# Patient Record
Sex: Male | Born: 1970 | Race: Black or African American | Hispanic: No | Marital: Single | Smoking: Current every day smoker
Health system: Southern US, Community
[De-identification: ages and names within clinical notes are randomized; demographics above are authoritative.]

## PROBLEM LIST (undated history)

## (undated) DIAGNOSIS — Z720 Tobacco use: Secondary | ICD-10-CM

## (undated) DIAGNOSIS — J45909 Unspecified asthma, uncomplicated: Secondary | ICD-10-CM

## (undated) DIAGNOSIS — J449 Chronic obstructive pulmonary disease, unspecified: Secondary | ICD-10-CM

## (undated) DIAGNOSIS — D696 Thrombocytopenia, unspecified: Secondary | ICD-10-CM

## (undated) DIAGNOSIS — E785 Hyperlipidemia, unspecified: Secondary | ICD-10-CM

## (undated) DIAGNOSIS — F419 Anxiety disorder, unspecified: Secondary | ICD-10-CM

## (undated) HISTORY — PX: FRACTURE SURGERY: SHX138

## (undated) HISTORY — DX: Anxiety disorder, unspecified: F41.9

## (undated) HISTORY — DX: Hyperlipidemia, unspecified: E78.5

---

## 1997-08-03 ENCOUNTER — Emergency Department (HOSPITAL_COMMUNITY): Admission: EM | Admit: 1997-08-03 | Discharge: 1997-08-03 | Payer: Self-pay | Admitting: Emergency Medicine

## 1998-07-09 ENCOUNTER — Encounter: Payer: Self-pay | Admitting: Emergency Medicine

## 1998-07-09 ENCOUNTER — Emergency Department (HOSPITAL_COMMUNITY): Admission: EM | Admit: 1998-07-09 | Discharge: 1998-07-09 | Payer: Self-pay | Admitting: Emergency Medicine

## 1998-07-28 ENCOUNTER — Emergency Department (HOSPITAL_COMMUNITY): Admission: EM | Admit: 1998-07-28 | Discharge: 1998-07-28 | Payer: Self-pay | Admitting: Emergency Medicine

## 1998-07-28 ENCOUNTER — Encounter: Payer: Self-pay | Admitting: Emergency Medicine

## 2005-05-01 ENCOUNTER — Emergency Department (HOSPITAL_COMMUNITY): Admission: EM | Admit: 2005-05-01 | Discharge: 2005-05-01 | Payer: Self-pay | Admitting: Family Medicine

## 2005-07-27 ENCOUNTER — Emergency Department (HOSPITAL_COMMUNITY): Admission: EM | Admit: 2005-07-27 | Discharge: 2005-07-27 | Payer: Self-pay | Admitting: Emergency Medicine

## 2006-01-17 ENCOUNTER — Emergency Department (HOSPITAL_COMMUNITY): Admission: EM | Admit: 2006-01-17 | Discharge: 2006-01-17 | Payer: Self-pay | Admitting: Emergency Medicine

## 2006-03-06 ENCOUNTER — Emergency Department (HOSPITAL_COMMUNITY): Admission: EM | Admit: 2006-03-06 | Discharge: 2006-03-07 | Payer: Self-pay | Admitting: Emergency Medicine

## 2006-03-08 ENCOUNTER — Emergency Department (HOSPITAL_COMMUNITY): Admission: EM | Admit: 2006-03-08 | Discharge: 2006-03-08 | Payer: Self-pay | Admitting: Emergency Medicine

## 2006-06-05 ENCOUNTER — Emergency Department (HOSPITAL_COMMUNITY): Admission: EM | Admit: 2006-06-05 | Discharge: 2006-06-05 | Payer: Self-pay | Admitting: Emergency Medicine

## 2006-12-11 ENCOUNTER — Emergency Department (HOSPITAL_COMMUNITY): Admission: EM | Admit: 2006-12-11 | Discharge: 2006-12-11 | Payer: Self-pay | Admitting: Emergency Medicine

## 2007-01-04 ENCOUNTER — Emergency Department (HOSPITAL_COMMUNITY): Admission: EM | Admit: 2007-01-04 | Discharge: 2007-01-04 | Payer: Self-pay | Admitting: Emergency Medicine

## 2007-04-18 ENCOUNTER — Emergency Department (HOSPITAL_COMMUNITY): Admission: EM | Admit: 2007-04-18 | Discharge: 2007-04-18 | Payer: Self-pay | Admitting: Emergency Medicine

## 2007-11-24 ENCOUNTER — Emergency Department (HOSPITAL_COMMUNITY): Admission: EM | Admit: 2007-11-24 | Discharge: 2007-11-24 | Payer: Self-pay | Admitting: Emergency Medicine

## 2008-03-17 ENCOUNTER — Emergency Department (HOSPITAL_COMMUNITY): Admission: EM | Admit: 2008-03-17 | Discharge: 2008-03-17 | Payer: Self-pay | Admitting: Emergency Medicine

## 2012-02-03 ENCOUNTER — Emergency Department (HOSPITAL_COMMUNITY)
Admission: EM | Admit: 2012-02-03 | Discharge: 2012-02-03 | Disposition: A | Discharge status: Home / Self Care | Payer: Self-pay | Attending: Emergency Medicine | Admitting: Emergency Medicine

## 2012-02-03 ENCOUNTER — Emergency Department (HOSPITAL_COMMUNITY): Payer: Self-pay

## 2012-02-03 ENCOUNTER — Encounter (HOSPITAL_COMMUNITY): Payer: Self-pay | Admitting: *Deleted

## 2012-02-03 DIAGNOSIS — S82899A Other fracture of unspecified lower leg, initial encounter for closed fracture: Secondary | ICD-10-CM

## 2012-02-03 DIAGNOSIS — Y9239 Other specified sports and athletic area as the place of occurrence of the external cause: Secondary | ICD-10-CM | POA: Insufficient documentation

## 2012-02-03 DIAGNOSIS — R609 Edema, unspecified: Secondary | ICD-10-CM | POA: Insufficient documentation

## 2012-02-03 DIAGNOSIS — S82853A Displaced trimalleolar fracture of unspecified lower leg, initial encounter for closed fracture: Secondary | ICD-10-CM | POA: Insufficient documentation

## 2012-02-03 DIAGNOSIS — Y9351 Activity, roller skating (inline) and skateboarding: Secondary | ICD-10-CM | POA: Insufficient documentation

## 2012-02-03 MED ORDER — OXYCODONE-ACETAMINOPHEN 5-325 MG PO TABS
2.0000 | ORAL_TABLET | Freq: Once | ORAL | Status: AC
  Administered 2012-02-03: 2 via ORAL
  Filled 2012-02-03: qty 2

## 2012-02-03 NOTE — Progress Notes (Signed)
Orthopedic Tech Progress Note Patient Details:  Anthony Finley 1970/06/25 [id number]  Ortho Devices Type of Ortho Device: Post (short) splint;Stirrup splint;Crutches Splint Material: Plaster Ortho Device/Splint Location: left leg Ortho Device/Splint Interventions: Application   Katieann Hungate 02/03/2012, 10:58 PM

## 2012-02-03 NOTE — Consult Note (Signed)
CC: left ankle fracture  HPI: pt roller skating and felt a pop to right ankle and then was unable to walk Denies any other injuries other than right ankle PMH: negative Meds: none Allergies: cephalosporins ROS: unable to ambulate on left ankle, otherwise negative Family: non contributory PE: alert and appropriate 41 y/o male in no acute distress Vitals within normal limits Bilateral upper extremities show full rom, no deformity or tenderness Left lower extremity: obvious deformity to left ankle with edema nv intact distally Right lower extremity negative exam X-rays: tri malleolar ankle fracture with widened mortis left Assessment: left tri malleolar ankle fracture with widened mortis Plan: splint, crutches and pain control Non weight bearing F/u next week to discuss surgical options

## 2012-02-03 NOTE — ED Provider Notes (Signed)
History     CSN: [id number]  Arrival date & time 02/03/12  2044   First MD Initiated Contact with Patient 02/03/12 2059      Chief Complaint  Patient presents with  . Ankle Pain    (Consider location/radiation/quality/duration/timing/severity/associated sxs/prior treatment) HPI Comments: Patient states he was rollerskating with his children when he slipped and fell, twisting his left ankle.  Now has medial swelling.    Patient is a 40 y.o. male presenting with ankle pain. The history is provided by the patient.  Ankle Pain  The incident occurred 1 to 2 hours ago. [place] The injury mechanism was a fall. The pain is present in the left ankle. The quality of the pain is described as aching. The pain is at a severity of 6/10. The pain is moderate. The pain has been constant since onset. Associated symptoms include numbness, inability to bear weight and loss of motion. Pertinent negatives include no loss of sensation and no tingling. The symptoms are aggravated by activity. He has tried nothing for the symptoms.    History reviewed. No pertinent past medical history.  History reviewed. No pertinent past surgical history.  History reviewed. No pertinent family history.  History  Substance Use Topics  . Smoking status: Current Every Day Smoker  . Smokeless tobacco: Not on file  . Alcohol Use: No      Review of Systems  Constitutional: Negative for fever.  Musculoskeletal: Positive for joint swelling.  Skin: Negative for wound.  Neurological: Positive for numbness. Negative for tingling.    Allergies  Cephalosporins; Fish-derived products; and Keflex  Home Medications   Current Outpatient Rx  Name Route Sig Dispense Refill  . ALBUTEROL SULFATE HFA 108 (90 BASE) MCG/ACT IN AERS Inhalation Inhale 2 puffs into the lungs every 6 (six) hours as needed. For shortness of breath      BP 121/87  Pulse 87  Temp 98.8 F (37.1 C) (Oral)  Resp 17   SpO2 97%  Physical Exam  Constitutional: He appears well-developed and well-nourished.  HENT:  Head: Normocephalic.  Eyes: Pupils are equal, round, and reactive to light.  Neck: Normal range of motion.  Cardiovascular: Normal rate.   Pulmonary/Chest: Effort normal.  Musculoskeletal: He exhibits edema and tenderness.       Left ankle: He exhibits decreased range of motion and swelling. He exhibits no ecchymosis, no deformity, no laceration and normal pulse. tenderness. Medial malleolus tenderness found.  Neurological: He is alert.  Skin: Skin is warm.    ED Course  Procedures (including critical care time)  Labs Reviewed - No data to display No results found.   No diagnosis found.    MDM   Patient seen by Alphonsa Overall, Dr. Deeann Dowse, PA.  Splint has been placed Refill and movement of toes.  Adequate.  He was given prescription for pain medication, and instructions for followup       Arman Filter, NP 02/03/12 2305

## 2012-02-03 NOTE — ED Provider Notes (Signed)
Medical screening examination/treatment/procedure(s) were performed by non-physician practitioner and as supervising physician I was immediately available for consultation/collaboration.   Richardean Canal, MD 02/03/12 2325

## 2012-02-03 NOTE — ED Notes (Signed)
Patient was roller skating when he injured his left ankle.  He states that he heard his ankle pop.  Patient is able to wiggle toes and feel his toes but pain is worse when stands on it.

## 2012-02-18 ENCOUNTER — Encounter (HOSPITAL_BASED_OUTPATIENT_CLINIC_OR_DEPARTMENT_OTHER): Payer: Self-pay | Admitting: *Deleted

## 2012-02-18 NOTE — Progress Notes (Signed)
Denies any heart problems-hx asthma-very rare to use inhaler. Does smoke

## 2012-02-22 ENCOUNTER — Ambulatory Visit (HOSPITAL_BASED_OUTPATIENT_CLINIC_OR_DEPARTMENT_OTHER): Payer: Self-pay | Admitting: Anesthesiology

## 2012-02-22 ENCOUNTER — Encounter (HOSPITAL_BASED_OUTPATIENT_CLINIC_OR_DEPARTMENT_OTHER): Payer: Self-pay | Admitting: Anesthesiology

## 2012-02-22 ENCOUNTER — Ambulatory Visit (HOSPITAL_BASED_OUTPATIENT_CLINIC_OR_DEPARTMENT_OTHER)
Admission: RE | Admit: 2012-02-22 | Discharge: 2012-02-22 | Disposition: A | Discharge status: Home / Self Care | Payer: Self-pay | Source: Ambulatory Visit | Attending: Orthopedic Surgery | Admitting: Orthopedic Surgery

## 2012-02-22 ENCOUNTER — Encounter (HOSPITAL_BASED_OUTPATIENT_CLINIC_OR_DEPARTMENT_OTHER)
Admission: RE | Disposition: A | Discharge status: Home / Self Care | Payer: Self-pay | Source: Ambulatory Visit | Attending: Orthopedic Surgery

## 2012-02-22 ENCOUNTER — Encounter (HOSPITAL_BASED_OUTPATIENT_CLINIC_OR_DEPARTMENT_OTHER): Payer: Self-pay

## 2012-02-22 ENCOUNTER — Ambulatory Visit (HOSPITAL_COMMUNITY): Payer: Self-pay

## 2012-02-22 DIAGNOSIS — Y929 Unspecified place or not applicable: Secondary | ICD-10-CM | POA: Insufficient documentation

## 2012-02-22 DIAGNOSIS — F17200 Nicotine dependence, unspecified, uncomplicated: Secondary | ICD-10-CM | POA: Insufficient documentation

## 2012-02-22 DIAGNOSIS — S82843A Displaced bimalleolar fracture of unspecified lower leg, initial encounter for closed fracture: Secondary | ICD-10-CM | POA: Insufficient documentation

## 2012-02-22 DIAGNOSIS — J45909 Unspecified asthma, uncomplicated: Secondary | ICD-10-CM | POA: Insufficient documentation

## 2012-02-22 DIAGNOSIS — S93439A Sprain of tibiofibular ligament of unspecified ankle, initial encounter: Secondary | ICD-10-CM | POA: Insufficient documentation

## 2012-02-22 DIAGNOSIS — S82842A Displaced bimalleolar fracture of left lower leg, initial encounter for closed fracture: Secondary | ICD-10-CM

## 2012-02-22 HISTORY — DX: Unspecified asthma, uncomplicated: J45.909

## 2012-02-22 HISTORY — PX: ORIF ANKLE FRACTURE: SHX5408

## 2012-02-22 SURGERY — OPEN REDUCTION INTERNAL FIXATION (ORIF) ANKLE FRACTURE
Anesthesia: General | Site: Ankle | Laterality: Left | Wound class: Clean

## 2012-02-22 MED ORDER — BUPIVACAINE HCL (PF) 0.5 % IJ SOLN
INTRAMUSCULAR | Status: DC | PRN
  Administered 2012-02-22: 150 mg

## 2012-02-22 MED ORDER — FENTANYL CITRATE 0.05 MG/ML IJ SOLN
INTRAMUSCULAR | Status: DC | PRN
  Administered 2012-02-22 (×3): 50 ug via INTRAVENOUS

## 2012-02-22 MED ORDER — RIVAROXABAN 10 MG PO TABS: 10.0000 mg | ORAL_TABLET | Freq: Every day | ORAL | Status: DC

## 2012-02-22 MED ORDER — OXYCODONE HCL 5 MG/5ML PO SOLN: 5.0000 mg | Freq: Once | ORAL | Status: DC | PRN

## 2012-02-22 MED ORDER — PROPOFOL 10 MG/ML IV BOLUS
INTRAVENOUS | Status: DC | PRN
  Administered 2012-02-22: 200 mg via INTRAVENOUS

## 2012-02-22 MED ORDER — BACITRACIN ZINC 500 UNIT/GM EX OINT
TOPICAL_OINTMENT | CUTANEOUS | Status: DC | PRN
  Administered 2012-02-22: 1 via TOPICAL

## 2012-02-22 MED ORDER — HYDROMORPHONE HCL PF 1 MG/ML IJ SOLN
0.2500 mg | INTRAMUSCULAR | Status: DC | PRN
  Administered 2012-02-22 (×2): 0.5 mg via INTRAVENOUS

## 2012-02-22 MED ORDER — MIDAZOLAM HCL 5 MG/5ML IJ SOLN
INTRAMUSCULAR | Status: DC | PRN
  Administered 2012-02-22: 2 mg via INTRAVENOUS

## 2012-02-22 MED ORDER — LIDOCAINE HCL (CARDIAC) 20 MG/ML IV SOLN
INTRAVENOUS | Status: DC | PRN
  Administered 2012-02-22: 75 mg via INTRAVENOUS

## 2012-02-22 MED ORDER — OXYCODONE HCL 5 MG PO TABS: 5.0000 mg | ORAL_TABLET | ORAL | Status: DC | PRN

## 2012-02-22 MED ORDER — MIDAZOLAM HCL 2 MG/2ML IJ SOLN
1.0000 mg | INTRAMUSCULAR | Status: DC | PRN
  Administered 2012-02-22: 2 mg via INTRAVENOUS

## 2012-02-22 MED ORDER — PHENYLEPHRINE HCL 10 MG/ML IJ SOLN
INTRAMUSCULAR | Status: DC | PRN
  Administered 2012-02-22 (×2): 80 ug via INTRAVENOUS

## 2012-02-22 MED ORDER — EPHEDRINE SULFATE 50 MG/ML IJ SOLN
INTRAMUSCULAR | Status: DC | PRN
  Administered 2012-02-22: 15 mg via INTRAVENOUS

## 2012-02-22 MED ORDER — LACTATED RINGERS IV SOLN
INTRAVENOUS | Status: DC
  Administered 2012-02-22 (×2): via INTRAVENOUS

## 2012-02-22 MED ORDER — OXYCODONE HCL 5 MG PO TABS: 5.0000 mg | ORAL_TABLET | Freq: Once | ORAL | Status: DC | PRN

## 2012-02-22 MED ORDER — PROMETHAZINE HCL 25 MG/ML IJ SOLN: 6.2500 mg | INTRAMUSCULAR | Status: DC | PRN

## 2012-02-22 MED ORDER — DEXAMETHASONE SODIUM PHOSPHATE 10 MG/ML IJ SOLN
INTRAMUSCULAR | Status: DC | PRN
  Administered 2012-02-22: 10 mg via INTRAVENOUS

## 2012-02-22 MED ORDER — FENTANYL CITRATE 0.05 MG/ML IJ SOLN
50.0000 ug | INTRAMUSCULAR | Status: DC | PRN
  Administered 2012-02-22: 100 ug via INTRAVENOUS

## 2012-02-22 MED ORDER — DEXAMETHASONE SODIUM PHOSPHATE 4 MG/ML IJ SOLN
INTRAMUSCULAR | Status: DC | PRN
  Administered 2012-02-22: 10 mg

## 2012-02-22 MED ORDER — PHENYLEPHRINE HCL 10 MG/ML IJ SOLN
10.0000 mg | INTRAVENOUS | Status: DC | PRN
  Administered 2012-02-22: 40 ug/min via INTRAVENOUS

## 2012-02-22 MED ORDER — VANCOMYCIN HCL IN DEXTROSE 1-5 GM/200ML-% IV SOLN
1000.0000 mg | INTRAVENOUS | Status: AC
  Administered 2012-02-22: 1000 mg via INTRAVENOUS

## 2012-02-22 SURGICAL SUPPLY — 68 items
ANCHOR SCREW TI W/SUT 3 (Screw) ×1 IMPLANT
BAG DECANTER FOR FLEXI CONT (MISCELLANEOUS) IMPLANT
BANDAGE ESMARK 6X9 LF (GAUZE/BANDAGES/DRESSINGS) ×1 IMPLANT
BIT DRILL 2.5X2.75 QC CALB (BIT) ×1 IMPLANT
BLADE SURG 15 STRL LF DISP TIS (BLADE) ×3 IMPLANT
BLADE SURG 15 STRL SS (BLADE) ×4
BNDG CMPR 9X6 STRL LF SNTH (GAUZE/BANDAGES/DRESSINGS) ×1
BNDG COHESIVE 4X5 TAN STRL (GAUZE/BANDAGES/DRESSINGS) ×3 IMPLANT
BNDG COHESIVE 6X5 TAN STRL LF (GAUZE/BANDAGES/DRESSINGS) ×3 IMPLANT
BNDG ESMARK 6X9 LF (GAUZE/BANDAGES/DRESSINGS) ×2
CHLORAPREP W/TINT 26ML (MISCELLANEOUS) ×2 IMPLANT
CLOTH BEACON ORANGE TIMEOUT ST (SAFETY) ×2 IMPLANT
COVER TABLE BACK 60X90 (DRAPES) ×2 IMPLANT
CUFF TOURNIQUET SINGLE 34IN LL (TOURNIQUET CUFF) ×1 IMPLANT
DECANTER SPIKE VIAL GLASS SM (MISCELLANEOUS) IMPLANT
DRAPE C-ARM 42X72 X-RAY (DRAPES) ×2 IMPLANT
DRAPE EXTREMITY T 121X128X90 (DRAPE) ×2 IMPLANT
DRAPE INCISE IOBAN 66X45 STRL (DRAPES) ×2 IMPLANT
DRAPE U-SHAPE 47X51 STRL (DRAPES) ×2 IMPLANT
DRAPE U-SHAPE 76X120 STRL (DRAPES) ×2 IMPLANT
DRESSING ADAPTIC 1/2  N-ADH (PACKING) IMPLANT
DRSG EMULSION OIL 3X3 NADH (GAUZE/BANDAGES/DRESSINGS) ×4 IMPLANT
DRSG PAD ABDOMINAL 8X10 ST (GAUZE/BANDAGES/DRESSINGS) ×4 IMPLANT
ELECT REM PT RETURN 9FT ADLT (ELECTROSURGICAL) ×2
ELECTRODE REM PT RTRN 9FT ADLT (ELECTROSURGICAL) ×1 IMPLANT
GLOVE BIO SURGEON STRL SZ8 (GLOVE) ×2 IMPLANT
GLOVE BIOGEL M 7.0 STRL (GLOVE) ×1 IMPLANT
GLOVE BIOGEL PI IND STRL 8 (GLOVE) ×1 IMPLANT
GLOVE BIOGEL PI INDICATOR 8 (GLOVE) ×1
GLOVE SKINSENSE NS SZ7.0 (GLOVE) ×1
GLOVE SKINSENSE STRL SZ7.0 (GLOVE) IMPLANT
GOWN BRE IMP PREV XXLGXLNG (GOWN DISPOSABLE) ×1 IMPLANT
GOWN PREVENTION PLUS XLARGE (GOWN DISPOSABLE) ×2 IMPLANT
GOWN PREVENTION PLUS XXLARGE (GOWN DISPOSABLE) ×1 IMPLANT
NEEDLE HYPO 22GX1.5 SAFETY (NEEDLE) IMPLANT
PACK BASIN DAY SURGERY FS (CUSTOM PROCEDURE TRAY) ×2 IMPLANT
PAD CAST 4YDX4 CTTN HI CHSV (CAST SUPPLIES) ×3 IMPLANT
PADDING CAST COTTON 4X4 STRL (CAST SUPPLIES) ×4
PADDING CAST COTTON 6X4 STRL (CAST SUPPLIES) ×2 IMPLANT
PENCIL BUTTON HOLSTER BLD 10FT (ELECTRODE) ×2 IMPLANT
PLATE ACE 100DEG 7HOLE (Plate) ×1 IMPLANT
SCREW CORTICAL 3.5MM  16MM (Screw) ×4 IMPLANT
SCREW CORTICAL 3.5MM  60MM (Screw) ×1 IMPLANT
SCREW CORTICAL 3.5MM 16MM (Screw) IMPLANT
SCREW CORTICAL 3.5MM 18MM (Screw) ×1 IMPLANT
SCREW CORTICAL 3.5MM 22MM (Screw) ×3 IMPLANT
SCREW CORTICAL 3.5MM 60MM (Screw) IMPLANT
SHEET MEDIUM DRAPE 40X70 STRL (DRAPES) ×2 IMPLANT
SPLINT FAST PLASTER 5X30 (CAST SUPPLIES) ×20
SPLINT PLASTER CAST FAST 5X30 (CAST SUPPLIES) ×20 IMPLANT
SPONGE GAUZE 4X4 12PLY (GAUZE/BANDAGES/DRESSINGS) ×2 IMPLANT
SPONGE LAP 18X18 X RAY DECT (DISPOSABLE) ×2 IMPLANT
STAPLER VISISTAT 35W (STAPLE) IMPLANT
STOCKINETTE 6  STRL (DRAPES) ×1
STOCKINETTE 6 STRL (DRAPES) ×1 IMPLANT
STRIP CLOSURE SKIN 1/2X4 (GAUZE/BANDAGES/DRESSINGS) IMPLANT
SUCTION FRAZIER TIP 10 FR DISP (SUCTIONS) ×2 IMPLANT
SUT MNCRL AB 3-0 PS2 18 (SUTURE) ×3 IMPLANT
SUT PROLENE 3 0 PS 2 (SUTURE) ×2 IMPLANT
SUT VIC AB 0 SH 27 (SUTURE) ×2 IMPLANT
SUT VIC AB 2-0 SH 27 (SUTURE)
SUT VIC AB 2-0 SH 27XBRD (SUTURE) IMPLANT
SUT VICRYL 4-0 PS2 18IN ABS (SUTURE) IMPLANT
SYR BULB 3OZ (MISCELLANEOUS) ×2 IMPLANT
SYR CONTROL 10ML LL (SYRINGE) IMPLANT
TUBE CONNECTING 20X1/4 (TUBING) ×2 IMPLANT
UNDERPAD 30X30 INCONTINENT (UNDERPADS AND DIAPERS) ×2 IMPLANT
WATER STERILE IRR 1000ML POUR (IV SOLUTION) IMPLANT

## 2012-02-22 NOTE — Anesthesia Postprocedure Evaluation (Signed)
Anesthesia Post Note  Patient: Anthony Finley  Procedure(s) Performed: Procedure(s) (LRB): OPEN REDUCTION INTERNAL FIXATION (ORIF) ANKLE FRACTURE (Left)  Anesthesia type: general  Patient location: PACU  Post pain: Pain level controlled  Post assessment: Patient's Cardiovascular Status Stable  Last Vitals:  Filed Vitals:   02/22/12 1500  BP: 92/43  Pulse: 92  Temp:   Resp: 21    Post vital signs: Reviewed and stable  Level of consciousness: sedated  Complications: No apparent anesthesia complications

## 2012-02-22 NOTE — Anesthesia Procedure Notes (Addendum)
Anesthesia Regional Block:  Popliteal block  Pre-Anesthetic Checklist: ,, timeout performed, Correct Patient, Correct Site, Correct Laterality, Correct Procedure,, site marked, risks and benefits discussed, Surgical consent,  Pre-op evaluation,  At surgeon's request and post-op pain management  Laterality: Left  Prep: chloraprep       Needles:  Injection technique: Single-shot  Needle Type: Echogenic Stimulator Needle          Additional Needles:  Procedures: ultrasound guided and nerve stimulator Popliteal block  Nerve Stimulator or Paresthesia:  Response: plantar flexion, 0.45 mA,   Additional Responses:   Narrative:  Start time: 02/22/2012 11:54 AM End time: 02/22/2012 12:05 PM  Performed by: Personally  Anesthesiologist: J. Adonis Huguenin, MD  Additional Notes: A functioning IV was confirmed and monitors were applied.  Sterile prep and drape, hand hygiene and sterile gloves were used.  Negative aspiration and test dose prior to incremental administration of local anesthetic. The patient tolerated the procedure well.Ultrasound  guidance: relevant anatomy identified, needle position confirmed, local anesthetic spread visualized around nerve(s), vascular puncture avoided.  Image printed for medical record.   Popliteal block Procedure Name: LMA Insertion Date/Time: 02/22/2012 12:45 PM Performed by: Zenia Resides D Pre-anesthesia Checklist: Patient identified, Emergency Drugs available, Suction available, Patient being monitored and Timeout performed Patient Re-evaluated:Patient Re-evaluated prior to inductionOxygen Delivery Method: Circle System Utilized Preoxygenation: Pre-oxygenation with 100% oxygen Intubation Type: IV induction Ventilation: Mask ventilation without difficulty LMA: LMA inserted LMA Size: 5.0 Number of attempts: 1 Airway Equipment and Method: bite block Placement Confirmation: positive ETCO2 and breath sounds checked- equal and bilateral Tube secured  with: Tape Dental Injury: Teeth and Oropharynx as per pre-operative assessment

## 2012-02-22 NOTE — Brief Op Note (Signed)
02/22/2012  2:38 PM  PATIENT:  Anthony Finley  41 y.o. male  PRE-OPERATIVE DIAGNOSIS:  Left Ankle Bimalleolar Fracture  POST-OPERATIVE DIAGNOSIS:  Left Ankle Bimalleolar Fracture with syndesmosis and deltoid ligament disruption  Procedure(s): 1.  ORIF left ankle bimalleolar fracture 2.  ORIF left syndesmosis 3.  Repair of left deltoid ligament 4.  Fluoro 5.  Stress exam of left ankle under fluoro  SURGEON:  Toni Arthurs, MD  ASSISTANT: n/a  ANESTHESIA:   General, regional  EBL:  minimal   TOURNIQUET:   Total Tourniquet Time Documented: Thigh (Left) - 82 minutes  COMPLICATIONS:  None apparent  DISPOSITION:  Extubated, awake and stable to recovery.  DICTATION ID:  [id number]

## 2012-02-22 NOTE — Transfer of Care (Signed)
Immediate Anesthesia Transfer of Care Note  Patient: Anthony Finley  Procedure(s) Performed: Procedure(s) (LRB) with comments: OPEN REDUCTION INTERNAL FIXATION (ORIF) ANKLE FRACTURE (Left) - Open Reduction Internal Fixation Left Ankle Bimalleolar Fracture;   Open Reduction Internal Fixation of Syndesmosis, and Repair of Deltoid Ligament  Patient Location: PACU  Anesthesia Type: General and Regional  Level of Consciousness: awake, alert  and oriented  Airway & Oxygen Therapy: Patient Spontanous Breathing and Patient connected to nasal cannula oxygen  Post-op Assessment: Report given to PACU RN and Post -op Vital signs reviewed and stable  Post vital signs: Reviewed and stable  Complications: No apparent anesthesia complications

## 2012-02-22 NOTE — H&P (Signed)
Anthony Finley is an 41 y.o. male.   Chief Complaint: left ankle injury HPI: 41 y/o male with PMH of smoking c/o L ankle pain since hurting it while roller skating last weekend.  He has a h/o left ankle fracture as a teenager but healed up and had no difficulties until this most recent injury.  He smokes a pack a day.  No h/o diabetes or thyroid problems.  Ankle is moderately painful, dull and aching.  He feels better with elevation.  Past Medical History  Diagnosis Date  . No pertinent past medical history   . Asthma     mostly as child-uses inh about q 3-4 months    Past Surgical History  Procedure Date  . Fracture surgery     orif fx lt ankle age 67    History reviewed. No pertinent family history. Social History:  reports that he has been smoking.  He does not have any smokeless tobacco history on file. He reports that he drinks alcohol. He reports that he does not use illicit drugs.  Allergies:  Allergies  Allergen Reactions  . Cephalosporins     Hives/ Swelling   . Fish-Derived Products     rash  . Keflex (Cephalexin)     unknown    Medications Prior to Admission  Medication Sig Dispense Refill  . HYDROcodone-acetaminophen (NORCO/VICODIN) 5-325 MG per tablet Take 1 tablet by mouth every 6 (six) hours as needed.      Marland Kitchen albuterol (PROVENTIL HFA;VENTOLIN HFA) 108 (90 BASE) MCG/ACT inhaler Inhale 2 puffs into the lungs every 6 (six) hours as needed. For shortness of breath        No results found for this or any previous visit (from the past 48 hour(s)). No results found.  ROS  No recent f/c/n/v/wt loss.  Blood pressure 113/80, pulse 89, temperature 98.3 F (36.8 C), temperature source Oral, resp. rate 14, height 6' (1.829 m), weight 107.049 kg (236 lb), SpO2 98.00%. Physical Exam  wn wd male in nad.  A and O x 4.  MOod and affect niormal.  EOMI.  Respirations unlabored.  L ankle with healthy and intact skin and only mild swelling.  2+ dp and pt pulses.     Assessment/Plan L ankle weber B lateral malleolus fracture - to OR for ORIF of this unstable left ankle fracture.  The risks and benefits of the alternative treatment options have been discussed in detail.  The patient wishes to proceed with surgery and specifically understands risks of bleeding, infection, nerve damage, blood clots, need for additional surgery, amputation and death.  He also understands that he's at increased risk of blood clots, infection, nonunion and wound healing problems due to his smoking.   Toni Arthurs 02/22/2012, 12:22 PM

## 2012-02-22 NOTE — Anesthesia Preprocedure Evaluation (Signed)
Anesthesia Evaluation  Patient identified by MRN, date of birth, ID band Patient awake    Reviewed: Allergy & Precautions, H&P , NPO status , Patient's Chart, lab work & pertinent test results, reviewed documented beta blocker date and time   History of Anesthesia Complications Negative for: history of anesthetic complications  Airway Mallampati: II TM Distance: >3 FB Neck ROM: Full    Dental  (+) Poor Dentition and Dental Advisory Given   Pulmonary asthma , Current Smoker,    Pulmonary exam normal       Cardiovascular negative cardio ROS      Neuro/Psych negative psych ROS   GI/Hepatic negative GI ROS, Neg liver ROS,   Endo/Other    Renal/GU negative Renal ROS     Musculoskeletal   Abdominal   Peds  Hematology negative hematology ROS (+)   Anesthesia Other Findings   Reproductive/Obstetrics                           Anesthesia Physical Anesthesia Plan  ASA: II  Anesthesia Plan: General   Post-op Pain Management:    Induction: Intravenous  Airway Management Planned: LMA  Additional Equipment:   Intra-op Plan:   Post-operative Plan: Extubation in OR  Informed Consent: I have reviewed the patients History and Physical, chart, labs and discussed the procedure including the risks, benefits and alternatives for the proposed anesthesia with the patient or authorized representative who has indicated his/her understanding and acceptance.   Dental advisory given  Plan Discussed with: CRNA, Anesthesiologist and Surgeon  Anesthesia Plan Comments:         Anesthesia Quick Evaluation

## 2012-02-22 NOTE — Progress Notes (Signed)
Assisted Dr. Singer with left, ultrasound guided, popliteal/saphenous block. Side rails up, monitors on throughout procedure. See vital signs in flow sheet. Tolerated Procedure well. 

## 2012-02-23 LAB — POCT HEMOGLOBIN-HEMACUE: Hemoglobin: 16.7 g/dL (ref 13.0–17.0)

## 2012-02-23 NOTE — Op Note (Signed)
NAMEDOUGLES, Anthony NO.:  [id number]  MEDICAL RECORD NO.:  [id number]  LOCATION:                               FACILITY:  [facility]  PHYSICIAN:  Toni Arthurs, MD        DATE OF BIRTH:  [birth year]  DATE OF PROCEDURE:  02/22/2012 DATE OF DISCHARGE:  02/22/2012                              OPERATIVE REPORT   PREOPERATIVE DIAGNOSIS:  Left ankle bimalleolar fracture.  POSTOPERATIVE DIAGNOSES: 1. Left ankle bimalleolar fracture. 2. Left ankle syndesmosis disruption. 3. Left ankle deltoid ligament disruption.  PROCEDURE: 1. Open reduction and internal fixation of left ankle bimalleolar     fracture. 2. Open reduction and internal fixation of left ankle syndesmosis. 3. Repair of left deltoid ligament. 4. Intraoperative interpretation of fluoroscopic imaging. 5. Stress examination of left ankle under fluoroscopy.  SURGEON:  Toni Arthurs, MD  ANESTHESIA:  General, regional.  ESTIMATED BLOOD LOSS:  Minimal.  TOURNIQUET TIME:  82 minutes at 225 mmHg.  COMPLICATIONS:  None apparent.  DISPOSITION:  Extubated awake and stable to recovery.  INDICATIONS FOR PROCEDURE:  The patient is a 41 year old male who was roller skating with his grandchild about a week ago when he injured his left ankle.  X-rays in the Emergency Department revealed a bimalleolar fracture including the lateral malleolus and the posterior malleolus. He also had evidence of widening of the medial clear space as well as the syndesmosis.  He presents now for operative treatment of these injuries.  He understands the risks and benefits, the alternative treatment options, and elected surgical treatment.  He specifically understands risks of bleeding, infection, nerve damage, blood clots, need for additional surgery, amputation, and death.  PROCEDURE IN DETAIL:  After preoperative consent was obtained and the correct operative site was identified, the patient was brought to the operating room and  placed supine on the operating table.  General anesthesia was induced.  Preoperative antibiotics were administered. Surgical time-out was taken.  Left lower extremity was prepped and draped in standard sterile fashion with a tourniquet around the thigh. The extremity was exsanguinated and tourniquet was inflated to 225 mmHg. An incision was made over the lateral malleolus.  Sharp dissection was carried down through the skin and subcutaneous tissue.  Fracture site was identified.  It was opened, cleaned of all hematoma and irrigated. The fracture was reduced, two 3.5 mm fully-threaded lag screws were inserted from posterior to anterior and were noted to compress the fracture appropriately.  An 7-hole 1/3rd tubular plate was then contoured to fit the lateral malleolus.  It was fixed proximally with 3 bicortical 3.5 mm screws and distally with 3 unicortical 3.5 mm screws.  At this point, mortise view was obtained under fluoroscopy. Dorsiflexion and external rotation stress was applied.  The syndesmosis was noted to widen as did the medial clear space.  The decision was made to proceed with fixation of the syndesmosis.  A stab incision was then made over the medial malleolus.  A large pelvic reduction clamp was then placed at the tip of the medial malleolus and then the distal screw at the lateral malleolus.  The syndesmosis was compressed and held with the clamp.  AP and  lateral fluoroscopic images showed appropriate reduction of the syndesmosis on both views.  At this point, the third most distal screw and the plate was removed.  A 3.5 mm fully-threaded cortical screw was inserted through all 4 cortices of the distal fibula and tibia holding the syndesmosis in a reduced position.  The clamp was removed. At this point, a mortise view was again obtained and dorsiflexion and external rotation stress was applied under live fluoroscopy.  The syndesmosis was noted to be stable, but the medial  clear space was noted to widen.  This was a rotational deformity.  With the ankle reduced posteriorly, the mortise reduced appropriately with the ankle subluxated forward to the medial clear space opened.  The decision was made at that point to proceed with repair of the deltoid ligament.  Curvilinear incision was made from the previous stab incision exposing the deltoid ligament.  The ligament was noted to be avulsed off the anterior-half of the medial malleolus.  The torn ligament was all removed from the bone. The cortical bone was removed with a rongeur.  A 3.5 mm titanium corkscrew anchor was inserted and noted to have appropriate purchase. The suture limbs were then used to repair the deltoid ligament back to its origin with multiple horizontal mattress sutures.  The tear in the superficial deltoid was then repaired with simple sutures of 0-Vicryl. Both wounds were irrigated copiously.  Inverted simple sutures of 3-0 Monocryl were used to close subcutaneous tissue.  The incisions were closed at the level of the skin with running 3-0 Prolene sutures. Sterile dressings were applied followed by a well-padded short-leg splint.  The patient was then awakened from anesthesia and transported to the recovery room in stable condition.  FOLLOWUP PLAN:  The patient will be discharged home, nonweightbearing in a short-leg splint.  He will follow up with me in 2 weeks for suture removal and conversion to a cast.     Toni Arthurs, MD     JH/MEDQ  D:  02/22/2012  T:  02/23/2012  Job:  [id number]

## 2012-02-28 ENCOUNTER — Encounter (HOSPITAL_BASED_OUTPATIENT_CLINIC_OR_DEPARTMENT_OTHER): Payer: Self-pay | Admitting: Orthopedic Surgery

## 2012-05-11 ENCOUNTER — Ambulatory Visit: Payer: Self-pay | Attending: Orthopedic Surgery | Admitting: Physical Therapy

## 2013-04-17 ENCOUNTER — Emergency Department (HOSPITAL_COMMUNITY)
Admission: EM | Admit: 2013-04-17 | Discharge: 2013-04-17 | Disposition: A | Discharge status: Home / Self Care | Payer: Self-pay | Attending: Emergency Medicine | Admitting: Emergency Medicine

## 2013-04-17 ENCOUNTER — Emergency Department (HOSPITAL_COMMUNITY): Payer: Self-pay

## 2013-04-17 ENCOUNTER — Encounter (HOSPITAL_COMMUNITY): Payer: Self-pay | Admitting: Emergency Medicine

## 2013-04-17 DIAGNOSIS — J45909 Unspecified asthma, uncomplicated: Secondary | ICD-10-CM

## 2013-04-17 DIAGNOSIS — IMO0001 Reserved for inherently not codable concepts without codable children: Secondary | ICD-10-CM | POA: Insufficient documentation

## 2013-04-17 DIAGNOSIS — J40 Bronchitis, not specified as acute or chronic: Secondary | ICD-10-CM

## 2013-04-17 DIAGNOSIS — F17200 Nicotine dependence, unspecified, uncomplicated: Secondary | ICD-10-CM | POA: Insufficient documentation

## 2013-04-17 DIAGNOSIS — R112 Nausea with vomiting, unspecified: Secondary | ICD-10-CM | POA: Insufficient documentation

## 2013-04-17 DIAGNOSIS — R197 Diarrhea, unspecified: Secondary | ICD-10-CM | POA: Insufficient documentation

## 2013-04-17 DIAGNOSIS — R52 Pain, unspecified: Secondary | ICD-10-CM | POA: Insufficient documentation

## 2013-04-17 DIAGNOSIS — Z7901 Long term (current) use of anticoagulants: Secondary | ICD-10-CM | POA: Insufficient documentation

## 2013-04-17 DIAGNOSIS — Z79899 Other long term (current) drug therapy: Secondary | ICD-10-CM | POA: Insufficient documentation

## 2013-04-17 DIAGNOSIS — J45901 Unspecified asthma with (acute) exacerbation: Secondary | ICD-10-CM | POA: Insufficient documentation

## 2013-04-17 MED ORDER — IPRATROPIUM BROMIDE 0.02 % IN SOLN
0.5000 mg | Freq: Once | RESPIRATORY_TRACT | Status: AC
  Administered 2013-04-17: 0.5 mg via RESPIRATORY_TRACT
  Filled 2013-04-17: qty 2.5

## 2013-04-17 MED ORDER — PREDNISONE 20 MG PO TABS
60.0000 mg | ORAL_TABLET | Freq: Once | ORAL | Status: AC
  Administered 2013-04-17: 60 mg via ORAL
  Filled 2013-04-17: qty 3

## 2013-04-17 MED ORDER — PREDNISONE 10 MG PO TABS: 60.0000 mg | ORAL_TABLET | Freq: Every day | ORAL | Status: DC

## 2013-04-17 MED ORDER — ALBUTEROL SULFATE (5 MG/ML) 0.5% IN NEBU
5.0000 mg | INHALATION_SOLUTION | Freq: Once | RESPIRATORY_TRACT | Status: AC
  Administered 2013-04-17: 5 mg via RESPIRATORY_TRACT
  Filled 2013-04-17: qty 1

## 2013-04-17 MED ORDER — IPRATROPIUM BROMIDE 0.02 % IN SOLN
0.5000 mg | Freq: Once | RESPIRATORY_TRACT | Status: AC
  Administered 2013-04-17: 0.5 mg via RESPIRATORY_TRACT

## 2013-04-17 MED ORDER — IPRATROPIUM BROMIDE 0.02 % IN SOLN
RESPIRATORY_TRACT | Status: AC
  Administered 2013-04-17: 0.5 mg
  Filled 2013-04-17: qty 2.5

## 2013-04-17 MED ORDER — ALBUTEROL SULFATE HFA 108 (90 BASE) MCG/ACT IN AERS
2.0000 | INHALATION_SPRAY | Freq: Once | RESPIRATORY_TRACT | Status: AC
  Administered 2013-04-17: 2 via RESPIRATORY_TRACT
  Filled 2013-04-17: qty 6.7

## 2013-04-17 NOTE — ED Notes (Signed)
Pt to nursing station demanding to leave voice raised  Pt states he has been here since 3:00 and he has to leave  Pt states he has been waiting 45 minutes for paperwork and other people have left before him  Apologized for delay and gave pt his discharge papers and reviewed them with him  Pt would not go back to the room for reassessment prior to discharge

## 2013-04-17 NOTE — ED Provider Notes (Signed)
CSN: [id number]     Arrival date & time 04/17/13  1457 History  This chart was scribed for non-physician practitioner Irish Elders, NP working with Shon Baton, MD by Danella Maiers, ED Scribe. This patient was seen in room WTR9/WTR9 and the patient's care was started at 4:55 PM.   Chief Complaint  Patient presents with  . Cough  . Generalized Body Aches   The history is provided by the patient. No language interpreter was used.   HPI Comments: Anthony Finley is a 42 y.o. male with a h/o asthma who presents to the Emergency Department complaining of worsening cough with green sputum onset one week ago with associated generalized body aches, fever, and chills. He has been using his albuterol inhaler. He is not on any other medications for his asthma. He also reports diarrhea and two episodes of emesis this week. He reports feeling light-headed and dizzy. He reports decreased appetite in the last 2 days. He states other people at work are sick with walking pneumonia.    Past Medical History  Diagnosis Date  . No pertinent past medical history   . Asthma     mostly as child-uses inh about q 3-4 months   Past Surgical History  Procedure Laterality Date  . Fracture surgery      orif fx lt ankle age 44  . Orif ankle fracture  02/22/2012    Procedure: OPEN REDUCTION INTERNAL FIXATION (ORIF) ANKLE FRACTURE;  Surgeon: Toni Arthurs, MD;  Location: [facility];  Service: Orthopedics;  Laterality: Left;  Open Reduction Internal Fixation Left Ankle Bimalleolar Fracture;   Open Reduction Internal Fixation of Syndesmosis, and Repair of Deltoid Ligament   No family history on file. History  Substance Use Topics  . Smoking status: Current Every Day Smoker -- 0.50 packs/day  . Smokeless tobacco: Not on file  . Alcohol Use: Yes     Comment: occ    Review of Systems  Constitutional: Positive for fever and chills.  Respiratory: Positive for cough.   Gastrointestinal: Positive  for nausea, vomiting and diarrhea.  Musculoskeletal: Positive for myalgias.  All other systems reviewed and are negative.    Allergies  Cephalosporins; Fish-derived products; and Keflex  Home Medications   Current Outpatient Rx  Name  Route  Sig  Dispense  Refill  . albuterol (PROVENTIL HFA;VENTOLIN HFA) 108 (90 BASE) MCG/ACT inhaler   Inhalation   Inhale 2 puffs into the lungs every 6 (six) hours as needed. For shortness of breath         . oxyCODONE (ROXICODONE) 5 MG immediate release tablet   Oral   Take 1-2 tablets (5-10 mg total) by mouth every 4 (four) hours as needed for pain.   50 tablet   0   . rivaroxaban (XARELTO) 10 MG TABS tablet   Oral   Take 1 tablet (10 mg total) by mouth daily.   14 tablet   0    BP 120/82  Pulse 103  Temp(Src) 98.1 F (36.7 C) (Oral)  Resp 16  SpO2 96% Physical Exam  Nursing note and vitals reviewed. Constitutional: He is oriented to person, place, and time. He appears well-developed and well-nourished. No distress.  HENT:  Head: Normocephalic and atraumatic.  Right Ear: Tympanic membrane normal.  Left Ear: Tympanic membrane normal.  Eyes: EOM are normal.  Neck: Neck supple. No tracheal deviation present.  Cardiovascular: Normal rate.   Pulmonary/Chest: Effort normal. No respiratory distress. He has wheezes in the  right middle field, the right lower field, the left middle field and the left lower field.  Musculoskeletal: Normal range of motion.  Neurological: He is alert and oriented to person, place, and time.  Skin: Skin is warm and dry.  Psychiatric: He has a normal mood and affect. His behavior is normal.    ED Course  Procedures (including critical care time) Medications - No data to display  DIAGNOSTIC STUDIES: Oxygen Saturation is 96% on RA, normal by my interpretation.    COORDINATION OF CARE: 5:22 PM- Discussed treatment plan with pt which includes CXR and breathing treatments. Pt agrees to plan.    Labs  Review Labs Reviewed - No data to display Imaging Review Dg Chest 2 View  04/17/2013   CLINICAL DATA:  Cough, chest pain.  EXAM: CHEST  2 VIEW  COMPARISON:  None.  FINDINGS: The heart size and mediastinal contours are within normal limits. Both lungs are clear. The visualized skeletal structures are unremarkable.  IMPRESSION: No active cardiopulmonary disease.   Electronically Signed   By: Elige Ko   On: 04/17/2013 17:37    EKG Interpretation   None       MDM   1. Bronchitis   2. Asthma    Afebrile. Chest x-ray; unremarkable. Wheezing cleared after nebs x2 and oral prednisone here. Pt states he is feeling better. SPO2 stable, no hypoxia or shortness of breath. Pt home with instructions and prednisone prescription. Advised to return if symptoms worsen or if fever, shortness of breath.  I personally performed the services described in this documentation, which was scribed in my presence. The recorded information has been reviewed and is accurate.    Irish Elders, NP 04/21/13 1626

## 2013-04-17 NOTE — ED Notes (Signed)
Pt c/o productive cough, body aches x 4 days.

## 2013-04-23 NOTE — ED Provider Notes (Signed)
Medical screening examination/treatment/procedure(s) were performed by non-physician practitioner and as supervising physician I was immediately available for consultation/collaboration.  EKG Interpretation   None         Courtney F Horton, MD 04/23/13 0658 

## 2013-08-09 ENCOUNTER — Emergency Department (HOSPITAL_COMMUNITY)
Admission: EM | Admit: 2013-08-09 | Discharge: 2013-08-09 | Discharge status: Against Medical Advice | Payer: Self-pay | Attending: Emergency Medicine | Admitting: Emergency Medicine

## 2013-08-09 ENCOUNTER — Encounter (HOSPITAL_COMMUNITY): Payer: Self-pay | Admitting: Emergency Medicine

## 2013-08-09 DIAGNOSIS — Y9389 Activity, other specified: Secondary | ICD-10-CM | POA: Insufficient documentation

## 2013-08-09 DIAGNOSIS — Y929 Unspecified place or not applicable: Secondary | ICD-10-CM | POA: Insufficient documentation

## 2013-08-09 DIAGNOSIS — S61209A Unspecified open wound of unspecified finger without damage to nail, initial encounter: Secondary | ICD-10-CM | POA: Insufficient documentation

## 2013-08-09 DIAGNOSIS — F17200 Nicotine dependence, unspecified, uncomplicated: Secondary | ICD-10-CM | POA: Insufficient documentation

## 2013-08-09 DIAGNOSIS — W278XXA Contact with other nonpowered hand tool, initial encounter: Secondary | ICD-10-CM | POA: Insufficient documentation

## 2013-08-09 DIAGNOSIS — J45909 Unspecified asthma, uncomplicated: Secondary | ICD-10-CM | POA: Insufficient documentation

## 2013-08-09 NOTE — ED Notes (Signed)
Pt states tired of waiting, decided to leave after triage without being seen by PA.

## 2013-08-09 NOTE — ED Notes (Signed)
Pt presents with c/o right ring finger laceration. Pt says the injury occurred about 6:30 this evening. Pt reports he was cutting a piece of wood with a saw and cut his finger, wrapped at this time, bleeding controlled.

## 2013-09-05 ENCOUNTER — Emergency Department (HOSPITAL_COMMUNITY)
Admission: EM | Admit: 2013-09-05 | Discharge: 2013-09-05 | Disposition: A | Discharge status: Home / Self Care | Payer: Self-pay | Attending: Emergency Medicine | Admitting: Emergency Medicine

## 2013-09-05 ENCOUNTER — Encounter (HOSPITAL_COMMUNITY): Payer: Self-pay | Admitting: Emergency Medicine

## 2013-09-05 ENCOUNTER — Emergency Department (HOSPITAL_COMMUNITY): Payer: Self-pay

## 2013-09-05 DIAGNOSIS — IMO0002 Reserved for concepts with insufficient information to code with codable children: Secondary | ICD-10-CM | POA: Insufficient documentation

## 2013-09-05 DIAGNOSIS — M7712 Lateral epicondylitis, left elbow: Secondary | ICD-10-CM

## 2013-09-05 DIAGNOSIS — F17200 Nicotine dependence, unspecified, uncomplicated: Secondary | ICD-10-CM | POA: Insufficient documentation

## 2013-09-05 DIAGNOSIS — Y9389 Activity, other specified: Secondary | ICD-10-CM | POA: Insufficient documentation

## 2013-09-05 DIAGNOSIS — S46909A Unspecified injury of unspecified muscle, fascia and tendon at shoulder and upper arm level, unspecified arm, initial encounter: Secondary | ICD-10-CM | POA: Insufficient documentation

## 2013-09-05 DIAGNOSIS — Y9289 Other specified places as the place of occurrence of the external cause: Secondary | ICD-10-CM | POA: Insufficient documentation

## 2013-09-05 DIAGNOSIS — X503XXA Overexertion from repetitive movements, initial encounter: Secondary | ICD-10-CM | POA: Insufficient documentation

## 2013-09-05 DIAGNOSIS — Z79899 Other long term (current) drug therapy: Secondary | ICD-10-CM | POA: Insufficient documentation

## 2013-09-05 DIAGNOSIS — J45909 Unspecified asthma, uncomplicated: Secondary | ICD-10-CM | POA: Insufficient documentation

## 2013-09-05 DIAGNOSIS — M7710 Lateral epicondylitis, unspecified elbow: Secondary | ICD-10-CM | POA: Insufficient documentation

## 2013-09-05 DIAGNOSIS — S4980XA Other specified injuries of shoulder and upper arm, unspecified arm, initial encounter: Secondary | ICD-10-CM | POA: Insufficient documentation

## 2013-09-05 DIAGNOSIS — Y990 Civilian activity done for income or pay: Secondary | ICD-10-CM | POA: Insufficient documentation

## 2013-09-05 MED ORDER — MELOXICAM 7.5 MG PO TABS: 15.0000 mg | ORAL_TABLET | Freq: Every day | ORAL | Status: DC

## 2013-09-05 MED ORDER — TRAMADOL HCL 50 MG PO TABS: 50.0000 mg | ORAL_TABLET | Freq: Four times a day (QID) | ORAL | Status: DC | PRN

## 2013-09-05 MED ORDER — HYDROCODONE-ACETAMINOPHEN 5-325 MG PO TABS: ORAL_TABLET | ORAL | Status: DC

## 2013-09-05 NOTE — Discharge Instructions (Signed)
Take meloxicam daily as prescribed to help with inflammation and help prevent worsening pain.  Take tramadol as needed for worsening pain. It may cause drowsiness, do not drive while taking.  Follow up with orthopedist for further evaluation and treatment.    Lateral Epicondylitis (Tennis Elbow) with Rehab Lateral epicondylitis involves inflammation and pain around the outer portion of the elbow. The pain is caused by inflammation of the tendons in the forearm that bring back (extend) the wrist. Lateral epicondylittis is also called tennis elbow, because it is very common in tennis players. However, it may occur in any individual who extends the wrist repetitively. If lateral epicondylitis is left untreated, it may become a chronic problem. SYMPTOMS   Pain, tenderness, and inflammation on the outer (lateral) side of the elbow.  Pain or weakness with gripping activities.  Pain that increases with wrist twisting motions (playing tennis, using a screwdriver, opening a door or a jar).  Pain with lifting objects, including a coffee cup. CAUSES  Lateral epicondylitis is caused by inflammation of the tendons that extend the wrist. Causes of injury may include:  Repetitive stress and strain on the muscles and tendons that extend the wrist.  Sudden change in activity level or intensity.  Incorrect grip in racquet sports.  Incorrect grip size of racquet (often too large).  Incorrect hitting position or technique (usually backhand, leading with the elbow).  Using a racket that is too heavy. RISK INCREASES WITH:  Sports or occupations that require repetitive and/or strenuous forearm and wrist movements (tennis, squash, racquetball, carpentry).  Poor wrist and forearm strength and flexibility.  Failure to warm up properly before activity.  Resuming activity before healing, rehabilitation, and conditioning are complete. PREVENTION   Warm up and stretch properly before activity.  Maintain  physical fitness:  Strength, flexibility, and endurance.  Cardiovascular fitness.  Wear and use properly fitted equipment.  Learn and use proper technique and have a coach correct improper technique.  Wear a tennis elbow (counterforce) brace. PROGNOSIS  The course of this condition depends on the degree of the injury. If treated properly, acute cases (symptoms lasting less than 4 weeks) are often resolved in 2 to 6 weeks. Chronic (longer lasting cases) often resolve in 3 to 6 months, but may require physical therapy. RELATED COMPLICATIONS   Frequently recurring symptoms, resulting in a chronic problem. Properly treating the problem the first time decreases frequency of recurrence.  Chronic inflammation, scarring tendon degeneration, and partial tendon tear, requiring surgery.  Delayed healing or resolution of symptoms. TREATMENT  Treatment first involves the use of ice and medicine, to reduce pain and inflammation. Strengthening and stretching exercises may help reduce discomfort, if performed regularly. These exercises may be performed at home, if the condition is an acute injury. Chronic cases may require a referral to a physical therapist for evaluation and treatment. Your caregiver may advise a corticosteroid injection, to help reduce inflammation. Rarely, surgery is needed. MEDICATION  If pain medicine is needed, nonsteroidal anti-inflammatory medicines (aspirin and ibuprofen), or other minor pain relievers (acetaminophen), are often advised.  Do not take pain medicine for 7 days before surgery.  Prescription pain relievers may be given, if your caregiver thinks they are needed. Use only as directed and only as much as you need.  Corticosteroid injections may be recommended. These injections should be reserved only for the most severe cases, because they can only be given a certain number of times. HEAT AND COLD  Cold treatment (icing) should be applied  for 10 to 15 minutes  every 2 to 3 hours for inflammation and pain, and immediately after activity that aggravates your symptoms. Use ice packs or an ice massage.  Heat treatment may be used before performing stretching and strengthening activities prescribed by your caregiver, physical therapist, or athletic trainer. Use a heat pack or a warm water soak. SEEK MEDICAL CARE IF: Symptoms get worse or do not improve in 2 weeks, despite treatment. EXERCISES  RANGE OF MOTION (ROM) AND STRETCHING EXERCISES - Epicondylitis, Lateral (Tennis Elbow) These exercises may help you when beginning to rehabilitate your injury. Your symptoms may go away with or without further involvement from your physician, physical therapist or athletic trainer. While completing these exercises, remember:   Restoring tissue flexibility helps normal motion to return to the joints. This allows healthier, less painful movement and activity.  An effective stretch should be held for at least 30 seconds.  A stretch should never be painful. You should only feel a gentle lengthening or release in the stretched tissue. RANGE OF MOTION  Wrist Flexion, Active-Assisted  Extend your right / left elbow with your fingers pointing down.*  Gently pull the back of your hand towards you, until you feel a gentle stretch on the top of your forearm.  Hold this position for __________ seconds. Repeat __________ times. Complete this exercise __________ times per day.  *If directed by your physician, physical therapist or athletic trainer, complete this stretch with your elbow bent, rather than extended. RANGE OF MOTION  Wrist Extension, Active-Assisted  Extend your right / left elbow and turn your palm upwards.*  Gently pull your palm and fingertips back, so your wrist extends and your fingers point more toward the ground.  You should feel a gentle stretch on the inside of your forearm.  Hold this position for __________ seconds. Repeat __________ times.  Complete this exercise __________ times per day. *If directed by your physician, physical therapist or athletic trainer, complete this stretch with your elbow bent, rather than extended. STRETCH - Wrist Flexion  Place the back of your right / left hand on a tabletop, leaving your elbow slightly bent. Your fingers should point away from your body.  Gently press the back of your hand down onto the table by straightening your elbow. You should feel a stretch on the top of your forearm.  Hold this position for __________ seconds. Repeat __________ times. Complete this stretch __________ times per day.  STRETCH  Wrist Extension   Place your right / left fingertips on a tabletop, leaving your elbow slightly bent. Your fingers should point backwards.  Gently press your fingers and palm down onto the table by straightening your elbow. You should feel a stretch on the inside of your forearm.  Hold this position for __________ seconds. Repeat __________ times. Complete this stretch __________ times per day.  STRENGTHENING EXERCISES - Epicondylitis, Lateral (Tennis Elbow) These exercises may help you when beginning to rehabilitate your injury. They may resolve your symptoms with or without further involvement from your physician, physical therapist or athletic trainer. While completing these exercises, remember:   Muscles can gain both the endurance and the strength needed for everyday activities through controlled exercises.  Complete these exercises as instructed by your physician, physical therapist or athletic trainer. Increase the resistance and repetitions only as guided.  You may experience muscle soreness or fatigue, but the pain or discomfort you are trying to eliminate should never worsen during these exercises. If this pain does get  worse, stop and make sure you are following the directions exactly. If the pain is still present after adjustments, discontinue the exercise until you can  discuss the trouble with your caregiver. STRENGTH Wrist Flexors  Sit with your right / left forearm palm-up and fully supported on a table or countertop. Your elbow should be resting below the height of your shoulder. Allow your wrist to extend over the edge of the surface.  Loosely holding a __________ weight, or a piece of rubber exercise band or tubing, slowly curl your hand up toward your forearm.  Hold this position for __________ seconds. Slowly lower the wrist back to the starting position in a controlled manner. Repeat __________ times. Complete this exercise __________ times per day.  STRENGTH  Wrist Extensors  Sit with your right / left forearm palm-down and fully supported on a table or countertop. Your elbow should be resting below the height of your shoulder. Allow your wrist to extend over the edge of the surface.  Loosely holding a __________ weight, or a piece of rubber exercise band or tubing, slowly curl your hand up toward your forearm.  Hold this position for __________ seconds. Slowly lower the wrist back to the starting position in a controlled manner. Repeat __________ times. Complete this exercise __________ times per day.  STRENGTH - Ulnar Deviators  Stand with a ____________________ weight in your right / left hand, or sit while holding a rubber exercise band or tubing, with your healthy arm supported on a table or countertop.  Move your wrist, so that your pinkie travels toward your forearm and your thumb moves away from your forearm.  Hold this position for __________ seconds and then slowly lower the wrist back to the starting position. Repeat __________ times. Complete this exercise __________ times per day STRENGTH - Radial Deviators  Stand with a ____________________ weight in your right / left hand, or sit while holding a rubber exercise band or tubing, with your injured arm supported on a table or countertop.  Raise your hand upward in front of you or  pull up on the rubber tubing.  Hold this position for __________ seconds and then slowly lower the wrist back to the starting position. Repeat __________ times. Complete this exercise __________ times per day. STRENGTH  Forearm Supinators   Sit with your right / left forearm supported on a table, keeping your elbow below shoulder height. Rest your hand over the edge, palm down.  Gently grip a hammer or a soup ladle.  Without moving your elbow, slowly turn your palm and hand upward to a "thumbs-up" position.  Hold this position for __________ seconds. Slowly return to the starting position. Repeat __________ times. Complete this exercise __________ times per day.  STRENGTH  Forearm Pronators   Sit with your right / left forearm supported on a table, keeping your elbow below shoulder height. Rest your hand over the edge, palm up.  Gently grip a hammer or a soup ladle.  Without moving your elbow, slowly turn your palm and hand upward to a "thumbs-up" position.  Hold this position for __________ seconds. Slowly return to the starting position. Repeat __________ times. Complete this exercise __________ times per day.  STRENGTH - Grip  Grasp a tennis ball, a dense sponge, or a large, rolled sock in your hand.  Squeeze as hard as you can, without increasing any pain.  Hold this position for __________ seconds. Release your grip slowly. Repeat __________ times. Complete this exercise __________ times per  day.  STRENGTH - Elbow Extensors, Isometric  Stand or sit upright, on a firm surface. Place your right / left arm so that your palm faces your stomach, and it is at the height of your waist.  Place your opposite hand on the underside of your forearm. Gently push up as your right / left arm resists. Push as hard as you can with both arms, without causing any pain or movement at your right / left elbow. Hold this stationary position for __________ seconds. Gradually release the tension in  both arms. Allow your muscles to relax completely before repeating. Document Released: 04/19/2005 Document Revised: 07/12/2011 Document Reviewed: 08/01/2008 [facility] Patient Information 2014 [place], [place].

## 2013-09-05 NOTE — ED Provider Notes (Signed)
CSN: [id number]     Arrival date & time 09/05/13  0810 History   First MD Initiated Contact with Patient 09/05/13 [id number]     Chief Complaint  Patient presents with  . Elbow Pain  . Shoulder Pain     (Consider location/radiation/quality/duration/timing/severity/associated sxs/prior Treatment) Patient is a 43 y.o. male presenting with shoulder pain. The history is provided by the patient. No language interpreter was used.  Shoulder Pain Associated symptoms include arthralgias and myalgias. Pertinent negatives include no joint swelling or neck pain.   Patient is a 43 year old male complaining of left elbow pain that has gradually worsened over the last 3 weeks. Patient reports lifting heavy wood around 3 weeks ago. Denies immediate pain but states the next day left elbow began to ache and throb. Patient states pain is now constant 9/10 at worst with movement states it is difficult to the only coughing on and time. Pain radiates into left shoulder. He is tried Tylenol PM without relief.  Denies previous injury or surgery to left arm. Patient is left-hand dominant. Denies numbness or tingling to the hand or arm.  Past Medical History  Diagnosis Date  . No pertinent past medical history   . Asthma     mostly as child-uses inh about q 3-4 months   Past Surgical History  Procedure Laterality Date  . Fracture surgery      orif fx lt ankle age 41  . Orif ankle fracture  02/22/2012    Procedure: OPEN REDUCTION INTERNAL FIXATION (ORIF) ANKLE FRACTURE;  Surgeon: Wylene Simmer, MD;  Location: [place];  Service: Orthopedics;  Laterality: Left;  Open Reduction Internal Fixation Left Ankle Bimalleolar Fracture;   Open Reduction Internal Fixation of Syndesmosis, and Repair of Deltoid Ligament   No family history on file. History  Substance Use Topics  . Smoking status: Current Every Day Smoker -- 0.50 packs/day    Types: Cigarettes  . Smokeless tobacco: Not on file  . Alcohol Use: No      Comment: occ    Review of Systems  Musculoskeletal: Positive for arthralgias and myalgias. Negative for joint swelling, neck pain and neck stiffness.       Left elbow radiating into left shoulder  Skin: Negative for color change and wound.  All other systems reviewed and are negative.     Allergies  Cephalosporins; Fish-derived products; and Keflex  Home Medications   Prior to Admission medications   Medication Sig Start Date End Date Taking? Authorizing Provider  acetaminophen (TYLENOL) 325 MG tablet Take 650 mg by mouth every 6 (six) hours as needed (pain).    Historical Provider, MD  albuterol (PROVENTIL HFA;VENTOLIN HFA) 108 (90 BASE) MCG/ACT inhaler Inhale 2 puffs into the lungs every 6 (six) hours as needed. For shortness of breath    Historical Provider, MD  predniSONE (DELTASONE) 10 MG tablet Take 6 tablets (60 mg total) by mouth daily. 04/17/13   Elisha Headland, NP  Pseudoeph-Doxylamine-DM-APAP (NYQUIL D COLD/FLU PO) Take 1 capsule by mouth at bedtime as needed (flu-like symptoms).    Historical Provider, MD   BP 125/72  Pulse 90  Temp(Src) 97.7 F (36.5 C) (Oral)  Resp 20  Ht 6' (1.829 m)  Wt 245 lb (111.131 kg)  BMI 33.22 kg/m2  SpO2 100% Physical Exam  Nursing note and vitals reviewed. Constitutional: He is oriented to person, place, and time. He appears well-developed and well-nourished.  HENT:  Head: Normocephalic and atraumatic.  Eyes: EOM are normal.  Neck: Normal range of motion.  Cardiovascular: Normal rate.   Pulses:      Radial pulses are 2+ on the left side.  Cap refill <3 seconds  Pulmonary/Chest: Effort normal.  Musculoskeletal: Normal range of motion. He exhibits tenderness. He exhibits no edema.  Left elbow-no deformity. FROM, increased pain with full elbow flexion. Tenderness along lateral epicondyle and surrounding musculature. 4/5 grip strength left hand vs right.   Neurological: He is alert and oriented to person, place, and time.   Sensation in tact distal to left elbow pain.  Skin: Skin is warm and dry.  Psychiatric: He has a normal mood and affect. His behavior is normal.    ED Course  Procedures (including critical care time) Labs Review Labs Reviewed - No data to display  Imaging Review Dg Elbow Complete Left  09/05/2013   CLINICAL DATA:  43 year old male with lifting injury, pain and tingling radiating from the posterior elbow to the shoulder. Decreased range of motion. Painful range of motion. Initial encounter.  EXAM: LEFT ELBOW - COMPLETE 3+ VIEW  COMPARISON:  None.  FINDINGS: No joint effusion identified. Bone mineralization is within normal limits. Joint spaces and alignment are preserved at the elbow. There is degenerative spurring at the olecranon on. Grossly normal soft tissue contours.  IMPRESSION: No acute osseous abnormality identified at the left elbow.   Electronically Signed   By: Lars Pinks M.D.   On: 09/05/2013 09:06     EKG Interpretation None      MDM   Final diagnoses:  Lateral epicondylitis of left elbow    Will tx for lateral epicondylitis. Left arm and hand neurovascularly in tact. Not concerned for septic joint. Will place in sling for comfort. Advised to f/u with PCP and orthopedist if not improving. Return precautions provided. Pt verbalized understanding and agreement with tx plan.     Noland Fordyce, PA-C 09/05/13 1701

## 2013-09-05 NOTE — ED Notes (Signed)
Patient states was lifting heavy wood at work x 3 weeks ago and has had L shoulder and L elbow pain since.   Patient has normal range of motion, but a lot of pain.

## 2013-09-05 NOTE — ED Notes (Signed)
Patient back from radiology.

## 2013-09-07 NOTE — ED Provider Notes (Signed)
Medical screening examination/treatment/procedure(s) were performed by non-physician practitioner and as supervising physician I was immediately available for consultation/collaboration.   EKG Interpretation None        Alfonzo Feller, DO 09/07/13 2115

## 2015-03-11 ENCOUNTER — Emergency Department (HOSPITAL_COMMUNITY)
Admission: EM | Admit: 2015-03-11 | Discharge: 2015-03-11 | Disposition: A | Discharge status: Home / Self Care | Payer: Self-pay | Attending: Emergency Medicine | Admitting: Emergency Medicine

## 2015-03-11 ENCOUNTER — Encounter (HOSPITAL_COMMUNITY): Payer: Self-pay | Admitting: Emergency Medicine

## 2015-03-11 ENCOUNTER — Emergency Department (HOSPITAL_COMMUNITY): Payer: Self-pay

## 2015-03-11 DIAGNOSIS — Y998 Other external cause status: Secondary | ICD-10-CM | POA: Insufficient documentation

## 2015-03-11 DIAGNOSIS — J45909 Unspecified asthma, uncomplicated: Secondary | ICD-10-CM | POA: Insufficient documentation

## 2015-03-11 DIAGNOSIS — S93402A Sprain of unspecified ligament of left ankle, initial encounter: Secondary | ICD-10-CM | POA: Insufficient documentation

## 2015-03-11 DIAGNOSIS — Y9301 Activity, walking, marching and hiking: Secondary | ICD-10-CM | POA: Insufficient documentation

## 2015-03-11 DIAGNOSIS — Z791 Long term (current) use of non-steroidal anti-inflammatories (NSAID): Secondary | ICD-10-CM | POA: Insufficient documentation

## 2015-03-11 DIAGNOSIS — Y9289 Other specified places as the place of occurrence of the external cause: Secondary | ICD-10-CM | POA: Insufficient documentation

## 2015-03-11 DIAGNOSIS — Z720 Tobacco use: Secondary | ICD-10-CM | POA: Insufficient documentation

## 2015-03-11 DIAGNOSIS — W010XXA Fall on same level from slipping, tripping and stumbling without subsequent striking against object, initial encounter: Secondary | ICD-10-CM | POA: Insufficient documentation

## 2015-03-11 MED ORDER — IBUPROFEN 600 MG PO TABS: 600.0000 mg | ORAL_TABLET | Freq: Four times a day (QID) | ORAL | Status: DC | PRN

## 2015-03-11 MED ORDER — OXYCODONE-ACETAMINOPHEN 5-325 MG PO TABS
1.0000 | ORAL_TABLET | Freq: Once | ORAL | Status: AC
  Administered 2015-03-11: 1 via ORAL
  Filled 2015-03-11: qty 1

## 2015-03-11 MED ORDER — HYDROCODONE-ACETAMINOPHEN 5-325 MG PO TABS: 1.0000 | ORAL_TABLET | Freq: Four times a day (QID) | ORAL | Status: DC | PRN

## 2015-03-11 NOTE — Discharge Instructions (Signed)
Take ibuprofen for pain. Vicodin for severe pain. Keep your ankle elevated, ice several times a day. Follow-up with [facility] orthopedics.   Ankle Sprain An ankle sprain is an injury to the strong, fibrous tissues (ligaments) that hold the bones of your ankle joint together.  CAUSES An ankle sprain is usually caused by a fall or by twisting your ankle. Ankle sprains most commonly occur when you step on the outer edge of your foot, and your ankle turns inward. People who participate in sports are more prone to these types of injuries.  SYMPTOMS   Pain in your ankle. The pain may be present at rest or only when you are trying to stand or walk.  Swelling.  Bruising. Bruising may develop immediately or within 1 to 2 days after your injury.  Difficulty standing or walking, particularly when turning corners or changing directions. DIAGNOSIS  Your caregiver will ask you details about your injury and perform a physical exam of your ankle to determine if you have an ankle sprain. During the physical exam, your caregiver will press on and apply pressure to specific areas of your foot and ankle. Your caregiver will try to move your ankle in certain ways. An X-ray exam may be done to be sure a bone was not broken or a ligament did not separate from one of the bones in your ankle (avulsion fracture).  TREATMENT  Certain types of braces can help stabilize your ankle. Your caregiver can make a recommendation for this. Your caregiver may recommend the use of medicine for pain. If your sprain is severe, your caregiver may refer you to a surgeon who helps to restore function to parts of your skeletal system (orthopedist) or a physical therapist. [place] ice to your injury for 1-2 days or as directed by your caregiver. Applying ice helps to reduce inflammation and pain.  Put ice in a plastic bag.  Place a towel between your skin and the bag.  Leave the ice on for 15-20 minutes at a  time, every 2 hours while you are awake.  Only take over-the-counter or prescription medicines for pain, discomfort, or fever as directed by your caregiver.  Elevate your injured ankle above the level of your heart as much as possible for 2-3 days.  If your caregiver recommends crutches, use them as instructed. Gradually put weight on the affected ankle. Continue to use crutches or a cane until you can walk without feeling pain in your ankle.  If you have a plaster splint, wear the splint as directed by your caregiver. Do not rest it on anything harder than a pillow for the first 24 hours. Do not put weight on it. Do not get it wet. You may take it off to take a shower or bath.  You may have been given an elastic bandage to wear around your ankle to provide support. If the elastic bandage is too tight (you have numbness or tingling in your foot or your foot becomes cold and blue), adjust the bandage to make it comfortable.  If you have an air splint, you may blow more air into it or let air out to make it more comfortable. You may take your splint off at night and before taking a shower or bath. Wiggle your toes in the splint several times per day to decrease swelling. SEEK MEDICAL CARE IF:   You have rapidly increasing bruising or swelling.  Your toes feel extremely cold or you lose feeling  in your foot.  Your pain is not relieved with medicine. SEEK IMMEDIATE MEDICAL CARE IF:  Your toes are numb or blue.  You have severe pain that is increasing. MAKE SURE YOU:   Understand these instructions.  Will watch your condition.  Will get help right away if you are not doing well or get worse.   This information is not intended to replace advice given to you by your health care provider. Make sure you discuss any questions you have with your health care provider.   Document Released: 04/19/2005 Document Revised: 05/10/2014 Document Reviewed: 05/01/2011 Elsevier Interactive Patient  Education [employer].

## 2015-03-11 NOTE — ED Notes (Signed)
Pt /o left ankle pain x 3 days, pt may have rolled ankle 2 days ago. Pt states he broke ankle back in 2013.

## 2015-03-11 NOTE — ED Provider Notes (Signed)
CSN: [id number]     Arrival date & time 03/11/15  1251 History   First MD Initiated Contact with Patient 03/11/15 1300     Chief Complaint  Patient presents with  . Ankle Pain    left     (Consider location/radiation/quality/duration/timing/severity/associated sxs/prior Treatment) HPI Anthony Finley is a 44 y.o. male presents to emergency department complaining of left ankle pain. Patient states he was walking his dogs 2 days ago and fell twisting his left ankle. He reports pain since then it has been getting worse every day. He states he is having difficulty walking on his ankle. He reports fracture of the same ankle 3 years ago and had to have open reduction and internal fixation of the fracture. He has taken ibuprofen for his pain prior to coming in but states it did not help. He denies any knee pain. He denies any foot pain. No numbness or weakness distal to the ankle.  Past Medical History  Diagnosis Date  . No pertinent past medical history   . Asthma     mostly as child-uses inh about q 3-4 months   Past Surgical History  Procedure Laterality Date  . Fracture surgery      orif fx lt ankle age 25  . Orif ankle fracture  02/22/2012    Procedure: OPEN REDUCTION INTERNAL FIXATION (ORIF) ANKLE FRACTURE;  Surgeon: Wylene Simmer, MD;  Location: [place];  Service: Orthopedics;  Laterality: Left;  Open Reduction Internal Fixation Left Ankle Bimalleolar Fracture;   Open Reduction Internal Fixation of Syndesmosis, and Repair of Deltoid Ligament   History reviewed. No pertinent family history. Social History  Substance Use Topics  . Smoking status: Current Every Day Smoker -- 0.50 packs/day    Types: Cigarettes  . Smokeless tobacco: None  . Alcohol Use: No     Comment: occ    Review of Systems  Constitutional: Negative for fever and chills.  Respiratory: Negative for cough, chest tightness and shortness of breath.   Cardiovascular: Negative for chest pain,  palpitations and leg swelling.  Musculoskeletal: Positive for joint swelling and arthralgias. Negative for myalgias, neck pain and neck stiffness.  Skin: Negative for rash.  Allergic/Immunologic: Negative for immunocompromised state.  Neurological: Negative for weakness and numbness.  All other systems reviewed and are negative.     Allergies  Cephalosporins; Fish-derived products; and Keflex  Home Medications   Prior to Admission medications   Medication Sig Start Date End Date Taking? Authorizing Provider  diphenhydrAMINE (BENADRYL) 25 MG tablet Take 25 mg by mouth at bedtime as needed for sleep.    Historical Provider, MD  Diphenhydramine-APAP, sleep, (TYLENOL PM EXTRA STRENGTH PO) Take 2 tablets by mouth at bedtime as needed (for pain).    Historical Provider, MD  HYDROcodone-acetaminophen (NORCO/VICODIN) 5-325 MG per tablet Take 1-2 pills every 4-6 hours as needed for pain. 09/05/13   Noland Fordyce, PA-C  meloxicam (MOBIC) 7.5 MG tablet Take 2 tablets (15 mg total) by mouth daily. 09/05/13   Noland Fordyce, PA-C   BP 141/83 mmHg  Pulse 87  Temp(Src) 97.5 F (36.4 C) (Oral)  Resp 18  SpO2 98% Physical Exam  Constitutional: He is oriented to person, place, and time. He appears well-developed and well-nourished. No distress.  HENT:  Head: Normocephalic and atraumatic.  Eyes: Conjunctivae are normal.  Neck: Neck supple.  Cardiovascular: Normal rate, regular rhythm and normal heart sounds.   Pulmonary/Chest: Effort normal. No respiratory distress. He has no wheezes. He  has no rales.  Musculoskeletal: He exhibits no edema.  Swelling noted to the lateral malleolus of the left ankle. No swelling noted to the medial malleolus foot. Tenderness over bilateral medial and lateral malleoli. No tenderness over Achilles tendon. Achilles tendon is intact. Pain with any range of motion of the ankle joint. Foot is nontender. Dorsal pedal pulses intact. Toes are warm, cap refill less than 2 seconds.   Neurological: He is alert and oriented to person, place, and time.  Skin: Skin is warm and dry.  Nursing note and vitals reviewed.   ED Course  Procedures (including critical care time) Labs Review Labs Reviewed - No data to display  Imaging Review Dg Ankle Complete Left  03/11/2015  CLINICAL DATA:  Patient tripped and fell 2 days ago while walking his dog and complains of lateral ankle pain. Initial encounter. Prior ORIF of bimalleolar left ankle fracture in October, 2013. EXAM: LEFT ANKLE COMPLETE - 3+ VIEW COMPARISON:  02/22/2012, 10/3/1,013. FINDINGS: No evidence of acute fracture or dislocation. Prior ORIF of B distal fibular fracture with routine healing. Ossification in the interosseous membrane distally. 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 Healed posterior malleolar fracture. Ankle mortise intact with mild joint space narrowing. Small joint effusion. IMPRESSION: 1. No acute osseous abnormality. 2. Prior ORIF of a distal fibular fracture with expected healing. 3. 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 Electronically Signed   By: Evangeline Dakin M.D.   On: 03/11/2015 14:09   I have personally reviewed and evaluated these images and lab results as part of my medical decision-making.   EKG Interpretation None      MDM   Final diagnoses:  Ankle sprain, left, initial encounter    Patient with left ankle injury, fracture in 2013 to the same ankle. Will get x-rays.   X-ray is negative for an acute fracture. ASO and crutches provided. Home with Norco and ibuprofen. Follow-up with orthopedic specialist for recheck.  Filed Vitals:   03/11/15 1259 03/11/15 1427  BP: 141/83   Pulse: 87 82  Temp: 97.5 F (36.4 C)   TempSrc: Oral   Resp: 18   SpO2: 98% 100%       [name], PA-C 03/11/15 1848  Blanchie Dessert, MD 03/12/15 2002

## 2015-05-14 ENCOUNTER — Emergency Department (HOSPITAL_COMMUNITY)
Admission: EM | Admit: 2015-05-14 | Discharge: 2015-05-14 | Disposition: A | Discharge status: Home / Self Care | Payer: Self-pay | Attending: Emergency Medicine | Admitting: Emergency Medicine

## 2015-05-14 DIAGNOSIS — F17210 Nicotine dependence, cigarettes, uncomplicated: Secondary | ICD-10-CM | POA: Insufficient documentation

## 2015-05-14 DIAGNOSIS — J45909 Unspecified asthma, uncomplicated: Secondary | ICD-10-CM | POA: Insufficient documentation

## 2015-05-14 DIAGNOSIS — M722 Plantar fascial fibromatosis: Secondary | ICD-10-CM | POA: Insufficient documentation

## 2015-05-14 DIAGNOSIS — Z791 Long term (current) use of non-steroidal anti-inflammatories (NSAID): Secondary | ICD-10-CM | POA: Insufficient documentation

## 2015-05-14 DIAGNOSIS — L723 Sebaceous cyst: Secondary | ICD-10-CM | POA: Insufficient documentation

## 2015-05-14 MED ORDER — MELOXICAM 15 MG PO TABS: 15.0000 mg | ORAL_TABLET | Freq: Every day | ORAL | Status: DC

## 2015-05-14 MED ORDER — TRAMADOL HCL 50 MG PO TABS: 50.0000 mg | ORAL_TABLET | Freq: Four times a day (QID) | ORAL | Status: DC | PRN

## 2015-05-14 NOTE — ED Notes (Addendum)
Pt c/o of lump under right arm x 1 month, denies drainage or pain. Pt also c/o right foot pain. Pt states pain is under bottom of foot. Denies pain right now but states it is a constant pain when standing.

## 2015-05-14 NOTE — Discharge Instructions (Signed)
Warm compresses to the cyst. Wear good arch supports. Try ice massage as discussed. Follow up with primary care doctor or podiatrist.   Plantar Fasciitis Plantar fasciitis is a painful foot condition that affects the heel. It occurs when the band of tissue that connects the toes to the heel bone (plantar fascia) becomes irritated. This can happen after exercising too much or doing other repetitive activities (overuse injury). The pain from plantar fasciitis can range from mild irritation to severe pain that makes it difficult for you to walk or move. The pain is usually worse in the morning or after you have been sitting or lying down for a while. CAUSES This condition may be caused by:  Standing for long periods of time.  Wearing shoes that do not fit.  Doing high-impact activities, including running, aerobics, and ballet.  Being overweight.  Having an abnormal way of walking (gait).  Having tight calf muscles.  Having high arches in your feet.  Starting a new athletic activity. SYMPTOMS The main symptom of this condition is heel pain. Other symptoms include:  Pain that gets worse after activity or exercise.  Pain that is worse in the morning or after resting.  Pain that goes away after you walk for a few minutes. DIAGNOSIS This condition may be diagnosed based on your signs and symptoms. Your health care provider will also do a physical exam to check for:  A tender area on the bottom of your foot.  A high arch in your foot.  Pain when you move your foot.  Difficulty moving your foot. You may also need to have imaging studies to confirm the diagnosis. These can include:  X-rays.  Ultrasound.  MRI. TREATMENT  Treatment for plantar fasciitis depends on the severity of the condition. Your treatment may include:  Rest, ice, and over-the-counter pain medicines to manage your pain.  Exercises to stretch your calves and your plantar fascia.  A splint that holds your  foot in a stretched, upward position while you sleep (night splint).  Physical therapy to relieve symptoms and prevent problems in the future.  Cortisone injections to relieve severe pain.  Extracorporeal shock wave therapy (ESWT) to stimulate damaged plantar fascia with electrical impulses. It is often used as a last resort before surgery.  Surgery, if other treatments have not worked after 12 months. HOME CARE INSTRUCTIONS  Take medicines only as directed by your health care provider.  Avoid activities that cause pain.  Roll the bottom of your foot over a bag of ice or a bottle of cold water. Do this for 20 minutes, 3-4 times a day.  Perform simple stretches as directed by your health care provider.  Try wearing athletic shoes with air-sole or gel-sole cushions or soft shoe inserts.  Wear a night splint while sleeping, if directed by your health care provider.  Keep all follow-up appointments with your health care provider. PREVENTION   Do not perform exercises or activities that cause heel pain.  Consider finding low-impact activities if you continue to have problems.  Lose weight if you need to. The best way to prevent plantar fasciitis is to avoid the activities that aggravate your plantar fascia. SEEK MEDICAL CARE IF:  Your symptoms do not go away after treatment with home care measures.  Your pain gets worse.  Your pain affects your ability to move or do your daily activities.   This information is not intended to replace advice given to you by your health care provider. Make  sure you discuss any questions you have with your health care provider.   Document Released: 01/12/2001 Document Revised: 01/08/2015 Document Reviewed: 02/27/2014 Elsevier Interactive Patient Education [employer].

## 2015-05-14 NOTE — ED Provider Notes (Signed)
CSN: [id number]     Arrival date & time 05/14/15  1101 History  By signing my name below, I, Irene Pap, attest that this documentation has been prepared under the direction and in the presence of Barbi Kumagai, PA-C. Electronically Signed: Irene Pap, ED Scribe. 05/14/2015. 12:29 PM.  Chief Complaint  Patient presents with  . Foot Pain    right  . bump under arm     right   The history is provided by the patient. No language interpreter was used.  HPI Comments: Anthony Finley is a 45 y.o. male who presents to the Emergency Department complaining of a hard, non-painful knot to the right axilla onset one month ago. Pt denies drainage from the area. The area is not red. It is not getting any bigger. The treatment for this prior to coming in. Pt also complains of sharp, intermittent right plantar foot pain onset 2 months ago. Pain is in the arch of the foot. Pt states that the foot is not painful until he stands on the foot. He has occasionally taken Aleve to no relief. He denies joint swelling, color change, wound, numbness or weakness. No injuries  Past Medical History  Diagnosis Date  . No pertinent past medical history   . Asthma     mostly as child-uses inh about q 3-4 months   Past Surgical History  Procedure Laterality Date  . Fracture surgery      orif fx lt ankle age 23  . Orif ankle fracture  02/22/2012    Procedure: OPEN REDUCTION INTERNAL FIXATION (ORIF) ANKLE FRACTURE;  Surgeon: Wylene Simmer, MD;  Location: [place];  Service: Orthopedics;  Laterality: Left;  Open Reduction Internal Fixation Left Ankle Bimalleolar Fracture;   Open Reduction Internal Fixation of Syndesmosis, and Repair of Deltoid Ligament   No family history on file. Social History  Substance Use Topics  . Smoking status: Current Every Day Smoker -- 0.50 packs/day    Types: Cigarettes  . Smokeless tobacco: Not on file  . Alcohol Use: No     Comment: occ    Review of  Systems  Constitutional: Negative for fever and chills.  Musculoskeletal: Positive for arthralgias. Negative for joint swelling.  Skin: Positive for wound. Negative for color change.  Neurological: Negative for weakness and numbness.   Allergies  Cephalosporins; Fish-derived products; and Keflex  Home Medications   Prior to Admission medications   Medication Sig Start Date End Date Taking? Authorizing Provider  diphenhydrAMINE (BENADRYL) 25 MG tablet Take 25 mg by mouth at bedtime as needed for sleep.    Historical Provider, MD  Diphenhydramine-APAP, sleep, (TYLENOL PM EXTRA STRENGTH PO) Take 2 tablets by mouth at bedtime as needed (for pain).    Historical Provider, MD  HYDROcodone-acetaminophen (NORCO) 5-325 MG tablet Take 1 tablet by mouth every 6 (six) hours as needed for moderate pain. 03/11/15   Raiden Yearwood, PA-C  ibuprofen (ADVIL,MOTRIN) 600 MG tablet Take 1 tablet (600 mg total) by mouth every 6 (six) hours as needed. 03/11/15   Preslyn Warr, PA-C  meloxicam (MOBIC) 7.5 MG tablet Take 2 tablets (15 mg total) by mouth daily. 09/05/13   Noland Fordyce, PA-C   BP 110/99 mmHg  Pulse 79  Temp(Src) 97.8 F (36.6 C) (Oral)  Resp 16  SpO2 97% Physical Exam  Constitutional: He is oriented to person, place, and time. He appears well-developed and well-nourished.  HENT:  Head: Normocephalic and atraumatic.  Eyes: EOM are normal.  Neck:  Normal range of motion. Neck supple.  Cardiovascular: Normal rate.   Pulmonary/Chest: Effort normal.  Musculoskeletal: Normal range of motion.  Normal-appearing right foot with no swelling or deformity. Tender to palpation over the plantar fascia of the foot. The larger motion of the ankle with no pain. Full range of motion of all toes.  Neurological: He is alert and oriented to person, place, and time.  Skin: Skin is warm and dry.  Small 1 x 1 cm cystic structure to the right axilla. Nontender. Not erythematous. Superficial, right under the  skin.  Psychiatric: He has a normal mood and affect. His behavior is normal.  Nursing note and vitals reviewed.   ED Course  Procedures (including critical care time) DIAGNOSTIC STUDIES: Oxygen Saturation is 97% on RA, normal by my interpretation.    COORDINATION OF CARE: 12:29 PM-Discussed treatment plan which includes ice, massage, anti-inflammatories with pt at bedside and pt agreed to plan.    Labs Review Labs Reviewed - No data to display  Imaging Review No results found. I have personally reviewed and evaluated these images and lab results as part of my medical decision-making.   EKG Interpretation None      MDM   Final diagnoses:  Sebaceous cyst of right axilla  Plantar fasciitis of right foot     patient emergency department complaining of a swelling to the right axilla and pain to the right foot. All symptoms for approximately a month or more. Right axillary cyst noted on exam. It is not swollen, erythematous, nondraining. It is not tender. Most likely sebaceous cyst. It is superficial, and I do not think this is a lymph node. Patient's foot exam is consistent with plantar fasciitis. Advised to get good arch supports, ice massage, NSAIDs, follow up with primary care doctor podiatry.  Filed Vitals:   05/14/15 1121  BP: 110/99  Pulse: 79  Temp: 97.8 F (36.6 C)  TempSrc: Oral  Resp: 16  SpO2: 97%   I personally performed the services described in this documentation, which was scribed in my presence. The recorded information has been reviewed and is accurate.   [name], PA-C 05/14/15 [place], MD 05/14/15 [phone]

## 2015-07-25 ENCOUNTER — Emergency Department (INDEPENDENT_AMBULATORY_CARE_PROVIDER_SITE_OTHER)
Admission: EM | Admit: 2015-07-25 | Discharge: 2015-07-25 | Disposition: A | Discharge status: Home / Self Care | Payer: Self-pay | Source: Home / Self Care | Attending: Family Medicine | Admitting: Family Medicine

## 2015-07-25 ENCOUNTER — Encounter (HOSPITAL_COMMUNITY): Payer: Self-pay | Admitting: Emergency Medicine

## 2015-07-25 DIAGNOSIS — M722 Plantar fascial fibromatosis: Secondary | ICD-10-CM

## 2015-07-25 MED ORDER — KETOROLAC TROMETHAMINE 60 MG/2ML IM SOLN
INTRAMUSCULAR | Status: AC
  Filled 2015-07-25: qty 2

## 2015-07-25 MED ORDER — NAPROXEN 500 MG PO TABS
500.0000 mg | ORAL_TABLET | Freq: Two times a day (BID) | ORAL | Status: DC
Start: 2015-07-25 — End: 2015-08-07

## 2015-07-25 MED ORDER — KETOROLAC TROMETHAMINE 60 MG/2ML IM SOLN
60.0000 mg | Freq: Once | INTRAMUSCULAR | Status: AC
  Administered 2015-07-25: 60 mg via INTRAMUSCULAR

## 2015-07-25 NOTE — Discharge Instructions (Signed)
It is a pleasure to see you today.   For the pain in your right heel/foot, I am prescribing NAPROXEN 500mg  take 1 tablet by mouth twice daily with food.  Do not take ibuprofen or the Aleve you have been taking.   Call Sports Medicine center for an appointment.   Note for work.   Ketorolac 60mg  intramuscular injection was given today in the urgent care center.

## 2015-07-25 NOTE — ED Notes (Signed)
The patient presented to the [facility] with a complaint of chronic left ankle pain secondary to a previous surgery and a complaint of a sharp pain in his right foot that has been occurring for greater than 1 month.

## 2015-07-25 NOTE — ED Provider Notes (Signed)
CSN: [id number]     Arrival date & time 07/25/15  1734 History   First MD Initiated Contact with Patient 07/25/15 1912     Chief Complaint  Patient presents with  . Foot Pain   (Consider location/radiation/quality/duration/timing/severity/associated sxs/prior Treatment) Patient is a 45 y.o. male presenting with lower extremity pain. The history is provided by the patient. No language interpreter was used.  Foot Pain   Patient presents for complaint of R heel pain, has been persistent for at least 2 months. Only when bears weight on the foot. Wears boots for work.  Has become more difficult to walk at work.  No traumatic injury of the R foot.  Has history of pain in the L foot, sustained fracture in L foot and had surgery 2013. Now is having some recurrence of pain in the L foot as a result of favoring the R due to the pain that is his chief complaint today.   No fevers or chills, no falls, no weakness.  Past Medical History  Diagnosis Date  . No pertinent past medical history   . Asthma     mostly as child-uses inh about q 3-4 months   Past Surgical History  Procedure Laterality Date  . Fracture surgery      orif fx lt ankle age 44  . Orif ankle fracture  02/22/2012    Procedure: OPEN REDUCTION INTERNAL FIXATION (ORIF) ANKLE FRACTURE;  Surgeon: Wylene Simmer, MD;  Location: [place];  Service: Orthopedics;  Laterality: Left;  Open Reduction Internal Fixation Left Ankle Bimalleolar Fracture;   Open Reduction Internal Fixation of Syndesmosis, and Repair of Deltoid Ligament   History reviewed. No pertinent family history. Social History  Substance Use Topics  . Smoking status: Current Every Day Smoker -- 0.50 packs/day    Types: Cigarettes  . Smokeless tobacco: None  . Alcohol Use: No     Comment: occ    Review of Systems  Allergies  Cephalosporins; Fish-derived products; and Keflex  Home Medications   Prior to Admission medications   Medication Sig Start  Date End Date Taking? Authorizing Provider  diphenhydrAMINE (BENADRYL) 25 MG tablet Take 25 mg by mouth at bedtime as needed for sleep.    Historical Provider, MD  Diphenhydramine-APAP, sleep, (TYLENOL PM EXTRA STRENGTH PO) Take 2 tablets by mouth at bedtime as needed (for pain).    Historical Provider, MD  HYDROcodone-acetaminophen (NORCO) 5-325 MG tablet Take 1 tablet by mouth every 6 (six) hours as needed for moderate pain. 03/11/15   Tatyana Kirichenko, PA-C  ibuprofen (ADVIL,MOTRIN) 600 MG tablet Take 1 tablet (600 mg total) by mouth every 6 (six) hours as needed. 03/11/15   Tatyana Kirichenko, PA-C  meloxicam (MOBIC) 15 MG tablet Take 1 tablet (15 mg total) by mouth daily. 05/14/15   Tatyana Kirichenko, PA-C  naproxen (NAPROSYN) 500 MG tablet Take 1 tablet (500 mg total) by mouth 2 (two) times daily with a meal. 07/25/15   [name], MD  traMADol (ULTRAM) 50 MG tablet Take 1 tablet (50 mg total) by mouth every 6 (six) hours as needed. 05/14/15   Tatyana Kirichenko, PA-C   Meds Ordered and Administered this Visit  Medications - No data to display  BP 143/80 mmHg  Pulse 86  Temp(Src) 97.7 F (36.5 C) (Oral)  Resp 20  SpO2 96% No data found.   Physical Exam  Constitutional: He appears well-developed and well-nourished. No distress.  Musculoskeletal:  Palpable dp pulses bilaterally.   Sensation  in toes of both feet grossly intact and symmetric.   Brisk cap refill < 2 seconds in toes of both feet.   RIGHT foot with tenderness over anterior calcaneus.   Full active ROM bilat ankles.  No tenderness over achilles tendon.  No tenderness to squeeze malleoli of R foot.   LEFT foot with scar along medial mid-foot. Full active ROM dorsi/plantar flexion of ankle.   Skin: He is not diaphoretic.    ED Course  Procedures (including critical care time)  Labs Review Labs Reviewed - No data to display  Imaging Review No results found.   Visual Acuity Review  Right Eye Distance:    Left Eye Distance:   Bilateral Distance:    Right Eye Near:   Left Eye Near:    Bilateral Near:         MDM   1. Plantar fasciitis of right foot    RIGHT plantar fasciitis.  Discussed heel cups; footwear; use of NSAIDs for pain.  Rest from work x 2 days.  He is instructed to see [facility] for evaluation.  He is receiving a Toradol 60mg  IM injection today.   Dalbert Mayotte MD    [name], MD 07/25/15 [phone]

## 2015-08-02 HISTORY — PX: HAND SURGERY: SHX662

## 2015-08-07 ENCOUNTER — Encounter (HOSPITAL_COMMUNITY): Payer: Self-pay | Admitting: Emergency Medicine

## 2015-08-07 ENCOUNTER — Emergency Department (HOSPITAL_COMMUNITY): Payer: Self-pay

## 2015-08-07 ENCOUNTER — Emergency Department (HOSPITAL_COMMUNITY)
Admission: EM | Admit: 2015-08-07 | Discharge: 2015-08-07 | Disposition: A | Discharge status: Home / Self Care | Payer: Self-pay | Attending: Emergency Medicine | Admitting: Emergency Medicine

## 2015-08-07 ENCOUNTER — Telehealth (HOSPITAL_COMMUNITY): Payer: Self-pay

## 2015-08-07 DIAGNOSIS — J45909 Unspecified asthma, uncomplicated: Secondary | ICD-10-CM | POA: Insufficient documentation

## 2015-08-07 DIAGNOSIS — S0081XA Abrasion of other part of head, initial encounter: Secondary | ICD-10-CM | POA: Insufficient documentation

## 2015-08-07 DIAGNOSIS — Z23 Encounter for immunization: Secondary | ICD-10-CM | POA: Insufficient documentation

## 2015-08-07 DIAGNOSIS — Y998 Other external cause status: Secondary | ICD-10-CM | POA: Insufficient documentation

## 2015-08-07 DIAGNOSIS — W503XXA Accidental bite by another person, initial encounter: Secondary | ICD-10-CM

## 2015-08-07 DIAGNOSIS — F17210 Nicotine dependence, cigarettes, uncomplicated: Secondary | ICD-10-CM | POA: Insufficient documentation

## 2015-08-07 DIAGNOSIS — Z791 Long term (current) use of non-steroidal anti-inflammatories (NSAID): Secondary | ICD-10-CM | POA: Insufficient documentation

## 2015-08-07 DIAGNOSIS — Y9389 Activity, other specified: Secondary | ICD-10-CM | POA: Insufficient documentation

## 2015-08-07 DIAGNOSIS — S61451A Open bite of right hand, initial encounter: Secondary | ICD-10-CM | POA: Insufficient documentation

## 2015-08-07 DIAGNOSIS — S62325A Displaced fracture of shaft of fourth metacarpal bone, left hand, initial encounter for closed fracture: Secondary | ICD-10-CM | POA: Insufficient documentation

## 2015-08-07 DIAGNOSIS — Y9289 Other specified places as the place of occurrence of the external cause: Secondary | ICD-10-CM | POA: Insufficient documentation

## 2015-08-07 DIAGNOSIS — S62308A Unspecified fracture of other metacarpal bone, initial encounter for closed fracture: Secondary | ICD-10-CM

## 2015-08-07 MED ORDER — TETANUS-DIPHTH-ACELL PERTUSSIS 5-2.5-18.5 LF-MCG/0.5 IM SUSP
0.5000 mL | Freq: Once | INTRAMUSCULAR | Status: AC
  Administered 2015-08-07: 0.5 mL via INTRAMUSCULAR
  Filled 2015-08-07: qty 0.5

## 2015-08-07 MED ORDER — METRONIDAZOLE 500 MG PO TABS: 500.0000 mg | ORAL_TABLET | Freq: Three times a day (TID) | ORAL | Status: DC

## 2015-08-07 MED ORDER — NAPROXEN 500 MG PO TABS: 500.0000 mg | ORAL_TABLET | Freq: Two times a day (BID) | ORAL | Status: DC

## 2015-08-07 MED ORDER — METRONIDAZOLE 500 MG PO TABS
500.0000 mg | ORAL_TABLET | Freq: Once | ORAL | Status: AC
  Administered 2015-08-07: 500 mg via ORAL
  Filled 2015-08-07: qty 1

## 2015-08-07 MED ORDER — SULFAMETHOXAZOLE-TRIMETHOPRIM 800-160 MG PO TABS: 1.0000 | ORAL_TABLET | Freq: Two times a day (BID) | ORAL | Status: AC

## 2015-08-07 MED ORDER — HYDROCODONE-ACETAMINOPHEN 5-325 MG PO TABS: 1.0000 | ORAL_TABLET | Freq: Four times a day (QID) | ORAL | Status: DC | PRN

## 2015-08-07 MED ORDER — OXYCODONE-ACETAMINOPHEN 5-325 MG PO TABS
1.0000 | ORAL_TABLET | Freq: Once | ORAL | Status: AC
  Administered 2015-08-07: 1 via ORAL
  Filled 2015-08-07: qty 1

## 2015-08-07 MED ORDER — SULFAMETHOXAZOLE-TRIMETHOPRIM 800-160 MG PO TABS
1.0000 | ORAL_TABLET | Freq: Once | ORAL | Status: AC
  Administered 2015-08-07: 1 via ORAL
  Filled 2015-08-07: qty 1

## 2015-08-07 NOTE — Discharge Instructions (Signed)
Take naprosyn for pain and inflammation. Take norco for severe pain. Take both flagyl and bactrim to prevent infection. Follow up with Dr. Grandville Silos as instructed. Return if any issues or signs of infection.   Metacarpal Fracture A metacarpal fracture is a break (fracture) of a bone in the hand. Metacarpals are the bones that extend from your knuckles to your wrist. In each hand, you have five metacarpal bones that connect your fingers and your thumb to your wrist. Some hand fractures have bone pieces that are close together and stable (simple). These fractures may be treated with only a splint or cast. Hand fractures that have many pieces of broken bone (comminuted), unstable bone pieces (displaced), or a bone that breaks through the skin (compound) usually require surgery. CAUSES This injury may be caused by:  A fall.  A hard, direct hit to your hand.  An injury that squeezes your knuckle, stretches your finger out of place, or crushes your hand. RISK FACTORS This injury is more likely to occur if:  You play contact sports.  You have certain bone diseases. SYMPTOMS  Symptoms of this type of fracture develop soon after the injury. Symptoms may include:  Swelling.  Pain.  Stiffness.  Increased pain with movement.  Bruising.  Inability to move a finger.  A shortened finger.  A finger knuckle that looks sunken in.  Unusual appearance of the hand or finger (deformity). DIAGNOSIS  This injury may be diagnosed based on your signs and symptoms, especially if you had a recent hand injury. Your health care provider will perform a physical exam. He or she may also order X-rays to confirm the diagnosis.  TREATMENT  Treatment for this injury depends on the type of fracture you have and how severe it is. Possible treatments include:  Non-reduction. This can be done if the bone does not need to be moved back into place. The fracture can be casted or splinted as it is.   Closed  reduction. If your bone is stable and can be moved back into place, you may only need to wear a cast or splint or have buddy taping.  Closed reduction with internal fixation (CRIF). This is the most common treatment. You may have this procedure if your bone can be moved back into place but needs more support. Wires, pins, or screws may be inserted through your skin to stabilize the fracture.  Open reduction with internal fixation (ORIF). This may be needed if your fracture is severe and unstable. It involves surgery to move your bone back into the right position. Screws, wires, or plates are used to stabilize the fracture. After all procedures, you may need to wear a cast or a splint for several weeks. You will also need to have follow-up X-rays to make sure that the bone is healing well and staying in position. After you no longer need your cast or splint, you may need physical therapy. This will help you to regain full movement and strength in your hand.  HOME CARE INSTRUCTIONS  If You Have a Cast:  Do not stick anything inside the cast to scratch your skin. Doing that increases your risk of infection.  Check the skin around the cast every day. Report any concerns to your health care provider. You may put lotion on dry skin around the edges of the cast. Do not apply lotion to the skin underneath the cast. If You Have a Splint:  Wear it as directed by your health care provider. Remove  it only as directed by your health care provider.  Loosen the splint if your fingers become numb and tingle, or if they turn cold and blue. Bathing  Cover the cast or splint with a watertight plastic bag to protect it from water while you take a bath or a shower. Do not let the cast or splint get wet. Managing Pain, Stiffness, and Swelling  If directed, apply ice to the injured area (if you have a splint, not a cast):  Put ice in a plastic bag.  Place a towel between your skin and the bag.  Leave the ice on  for 20 minutes, 2-3 times a day.  Move your fingers often to avoid stiffness and to lessen swelling.  Raise the injured area above the level of your heart while you are sitting or lying down. Driving  Do not drive or operate heavy machinery while taking pain medicine.  Do not drive while wearing a cast or splint on a hand that you use for driving. Activity  Return to your normal activities as directed by your health care provider. Ask your health care provider what activities are safe for you. General Instructions  Do not put pressure on any part of the cast or splint until it is fully hardened. This may take several hours.  Keep the cast or splint clean and dry.  Do not use any tobacco products, including cigarettes, chewing tobacco, or electronic cigarettes. Tobacco can delay bone healing. If you need help quitting, ask your health care provider.  Take medicines only as directed by your health care provider.  Keep all follow-up visits as directed by your health care provider. This is important. SEEK MEDICAL CARE IF:   Your pain is getting worse.  You have redness, swelling, or pain in the injured area.   You have fluid, blood, or pus coming from under your cast or splint.   You notice a bad smell coming from under your cast or splint.   You have a fever.  SEEK IMMEDIATE MEDICAL CARE IF:   You develop a rash.   You have trouble breathing.   Your skin or nails on your injured hand turn blue or gray even after you loosen your splint.  Your injured hand feels cold or becomes numb even after you loosen your splint.   You develop severe pain under the cast or in your hand.   This information is not intended to replace advice given to you by your health care provider. Make sure you discuss any questions you have with your health care provider.   Document Released: 04/19/2005 Document Revised: 01/08/2015 Document Reviewed: 02/06/2014 Elsevier Interactive Patient  Education 2016 [place].   Abrasion An abrasion is a cut or scrape on the outer surface of your skin. An abrasion does not extend through all of the layers of your skin. It is important to care for your abrasion properly to prevent infection. CAUSES Most abrasions are caused by falling on or gliding across the ground or another surface. When your skin rubs on something, the outer and inner layer of skin rubs off.  SYMPTOMS A cut or scrape is the main symptom of this condition. The scrape may be bleeding, or it may appear red or pink. If there was an associated fall, there may be an underlying bruise. DIAGNOSIS An abrasion is diagnosed with a physical exam. TREATMENT Treatment for this condition depends on how large and deep the abrasion is. Usually, your abrasion will be cleaned  with water and mild soap. This removes any dirt or debris that may be stuck. An antibiotic ointment may be applied to the abrasion to help prevent infection. A bandage (dressing) may be placed on the abrasion to keep it clean. You may also need a tetanus shot. HOME CARE INSTRUCTIONS Medicines  Take or apply medicines only as directed by your health care provider.  If you were prescribed an antibiotic ointment, finish all of it even if you start to feel better. Wound Care  Clean the wound with mild soap and water 2-3 times per day or as directed by your health care provider. Pat your wound dry with a clean towel. Do not rub it.  There are many different ways to close and cover a wound. Follow instructions from your health care provider about:  Wound care.  Dressing changes and removal.  Check your wound every day for signs of infection. Watch for:  Redness, swelling, or pain.  Fluid, blood, or pus. General Instructions  Keep the dressing dry as directed by your health care provider. Do not take baths, swim, use a hot tub, or do anything that would put your wound underwater until your health care  provider approves.  If there is swelling, raise (elevate) the injured area above the level of your heart while you are sitting or lying down.  Keep all follow-up visits as directed by your health care provider. This is important. SEEK MEDICAL CARE IF:  You received a tetanus shot and you have swelling, severe pain, redness, or bleeding at the injection site.  Your pain is not controlled with medicine.  You have increased redness, swelling, or pain at the site of your wound. SEEK IMMEDIATE MEDICAL CARE IF:  You have a red streak going away from your wound.  You have a fever.  You have fluid, blood, or pus coming from your wound.  You notice a bad smell coming from your wound or your dressing.   This information is not intended to replace advice given to you by your health care provider. Make sure you discuss any questions you have with your health care provider.   Document Released: 01/27/2005 Document Revised: 01/08/2015 Document Reviewed: 04/17/2014 Elsevier Interactive Patient Education 2016 [place] or Splint Care Casts and splints support injured limbs and keep bones from moving while they heal. It is important to care for your cast or splint at home.  HOME CARE INSTRUCTIONS  Keep the cast or splint uncovered during the drying period. It can take 24 to 48 hours to dry if it is made of plaster. A fiberglass cast will dry in less than 1 hour.  Do not rest the cast on anything harder than a pillow for the first 24 hours.  Do not put weight on your injured limb or apply pressure to the cast until your health care provider gives you permission.  Keep the cast or splint dry. Wet casts or splints can lose their shape and may not support the limb as well. A wet cast that has lost its shape can also create harmful pressure on your skin when it dries. Also, wet skin can become infected.  Cover the cast or splint with a plastic bag when bathing or when out in the rain or  snow. If the cast is on the trunk of the body, take sponge baths until the cast is removed.  If your cast does become wet, dry it with a towel or a blow dryer on  the cool setting only.  Keep your cast or splint clean. Soiled casts may be wiped with a moistened cloth.  Do not place any hard or soft foreign objects under your cast or splint, such as cotton, toilet paper, lotion, or powder.  Do not try to scratch the skin under the cast with any object. The object could get stuck inside the cast. Also, scratching could lead to an infection. If itching is a problem, use a blow dryer on a cool setting to relieve discomfort.  Do not trim or cut your cast or remove padding from inside of it.  Exercise all joints next to the injury that are not immobilized by the cast or splint. For example, if you have a long leg cast, exercise the hip joint and toes. If you have an arm cast or splint, exercise the shoulder, elbow, thumb, and fingers.  Elevate your injured arm or leg on 1 or 2 pillows for the first 1 to 3 days to decrease swelling and pain.It is best if you can comfortably elevate your cast so it is higher than your heart. SEEK MEDICAL CARE IF:   Your cast or splint cracks.  Your cast or splint is too tight or too loose.  You have unbearable itching inside the cast.  Your cast becomes wet or develops a soft spot or area.  You have a bad smell coming from inside your cast.  You get an object stuck under your cast.  Your skin around the cast becomes red or raw.  You have new pain or worsening pain after the cast has been applied. SEEK IMMEDIATE MEDICAL CARE IF:   You have fluid leaking through the cast.  You are unable to move your fingers or toes.  You have discolored (blue or white), cool, painful, or very swollen fingers or toes beyond the cast.  You have tingling or numbness around the injured area.  You have severe pain or pressure under the cast.  You have any difficulty  with your breathing or have shortness of breath.  You have chest pain.   This information is not intended to replace advice given to you by your health care provider. Make sure you discuss any questions you have with your health care provider.   Document Released: 04/16/2000 Document Revised: 02/07/2013 Document Reviewed: 10/26/2012 Elsevier Interactive Patient Education [employer].
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
## 2015-08-07 NOTE — Telephone Encounter (Signed)
Pharmacy calling pt wanting to just get pain med (Norco) filled tonight and not antibiotic.  Informed pharmacy pt will need to get antibiotic filled if wants to get Norco filled.
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
## 2015-08-08 ENCOUNTER — Other Ambulatory Visit: Payer: Self-pay | Admitting: Orthopedic Surgery

## 2015-08-08 ENCOUNTER — Encounter (HOSPITAL_BASED_OUTPATIENT_CLINIC_OR_DEPARTMENT_OTHER): Payer: Self-pay | Admitting: *Deleted

## 2015-08-11 ENCOUNTER — Ambulatory Visit (HOSPITAL_BASED_OUTPATIENT_CLINIC_OR_DEPARTMENT_OTHER)
Admission: RE | Admit: 2015-08-11 | Discharge: 2015-08-11 | Disposition: A | Discharge status: Home / Self Care | Payer: Self-pay | Source: Ambulatory Visit | Attending: Orthopedic Surgery | Admitting: Orthopedic Surgery

## 2015-08-11 ENCOUNTER — Ambulatory Visit (HOSPITAL_BASED_OUTPATIENT_CLINIC_OR_DEPARTMENT_OTHER): Payer: Self-pay | Admitting: Anesthesiology

## 2015-08-11 ENCOUNTER — Ambulatory Visit (HOSPITAL_COMMUNITY): Payer: Self-pay

## 2015-08-11 ENCOUNTER — Encounter (HOSPITAL_BASED_OUTPATIENT_CLINIC_OR_DEPARTMENT_OTHER): Payer: Self-pay | Admitting: Anesthesiology

## 2015-08-11 ENCOUNTER — Encounter (HOSPITAL_BASED_OUTPATIENT_CLINIC_OR_DEPARTMENT_OTHER)
Admission: RE | Disposition: A | Discharge status: Home / Self Care | Payer: Self-pay | Source: Ambulatory Visit | Attending: Orthopedic Surgery

## 2015-08-11 DIAGNOSIS — Z419 Encounter for procedure for purposes other than remedying health state, unspecified: Secondary | ICD-10-CM

## 2015-08-11 DIAGNOSIS — F17210 Nicotine dependence, cigarettes, uncomplicated: Secondary | ICD-10-CM | POA: Insufficient documentation

## 2015-08-11 DIAGNOSIS — S62325A Displaced fracture of shaft of fourth metacarpal bone, left hand, initial encounter for closed fracture: Secondary | ICD-10-CM | POA: Insufficient documentation

## 2015-08-11 DIAGNOSIS — S60571A Other superficial bite of hand of right hand, initial encounter: Secondary | ICD-10-CM | POA: Insufficient documentation

## 2015-08-11 HISTORY — PX: PERCUTANEOUS PINNING: SHX2209

## 2015-08-11 SURGERY — PINNING, EXTREMITY, PERCUTANEOUS
Anesthesia: General | Site: Hand | Laterality: Left

## 2015-08-11 MED ORDER — SCOPOLAMINE 1 MG/3DAYS TD PT72: 1.0000 | MEDICATED_PATCH | Freq: Once | TRANSDERMAL | Status: DC | PRN

## 2015-08-11 MED ORDER — FENTANYL CITRATE (PF) 100 MCG/2ML IJ SOLN
INTRAMUSCULAR | Status: AC
  Filled 2015-08-11: qty 2

## 2015-08-11 MED ORDER — PROPOFOL 10 MG/ML IV BOLUS
INTRAVENOUS | Status: AC
  Filled 2015-08-11: qty 40

## 2015-08-11 MED ORDER — OXYCODONE-ACETAMINOPHEN 5-325 MG PO TABS: 1.0000 | ORAL_TABLET | ORAL | Status: DC | PRN

## 2015-08-11 MED ORDER — MIDAZOLAM HCL 2 MG/2ML IJ SOLN
1.0000 mg | INTRAMUSCULAR | Status: DC | PRN
  Administered 2015-08-11: 2 mg via INTRAVENOUS

## 2015-08-11 MED ORDER — PROPOFOL 10 MG/ML IV BOLUS
INTRAVENOUS | Status: DC | PRN
  Administered 2015-08-11: 300 mg via INTRAVENOUS

## 2015-08-11 MED ORDER — ONDANSETRON HCL 4 MG/2ML IJ SOLN
INTRAMUSCULAR | Status: AC
  Filled 2015-08-11: qty 2

## 2015-08-11 MED ORDER — OXYCODONE HCL 5 MG PO TABS: 5.0000 mg | ORAL_TABLET | Freq: Once | ORAL | Status: DC | PRN

## 2015-08-11 MED ORDER — PHENYLEPHRINE HCL 10 MG/ML IJ SOLN
INTRAMUSCULAR | Status: DC | PRN
  Administered 2015-08-11: 80 ug via INTRAVENOUS

## 2015-08-11 MED ORDER — CLINDAMYCIN PHOSPHATE 300 MG/50ML IV SOLN
300.0000 mg | INTRAVENOUS | Status: AC
  Administered 2015-08-11: 300 mg via INTRAVENOUS

## 2015-08-11 MED ORDER — LIDOCAINE HCL (CARDIAC) 20 MG/ML IV SOLN
INTRAVENOUS | Status: AC
  Filled 2015-08-11: qty 5

## 2015-08-11 MED ORDER — DEXAMETHASONE SODIUM PHOSPHATE 4 MG/ML IJ SOLN
INTRAMUSCULAR | Status: DC | PRN
  Administered 2015-08-11: 10 mg via INTRAVENOUS

## 2015-08-11 MED ORDER — HYDROMORPHONE HCL 1 MG/ML IJ SOLN
0.2500 mg | INTRAMUSCULAR | Status: DC | PRN
  Administered 2015-08-11 (×4): 0.5 mg via INTRAVENOUS

## 2015-08-11 MED ORDER — LACTATED RINGERS IV SOLN: INTRAVENOUS | Status: DC

## 2015-08-11 MED ORDER — ALBUTEROL SULFATE HFA 108 (90 BASE) MCG/ACT IN AERS
INHALATION_SPRAY | RESPIRATORY_TRACT | Status: AC
  Filled 2015-08-11: qty 6.7

## 2015-08-11 MED ORDER — MEPERIDINE HCL 25 MG/ML IJ SOLN: 6.2500 mg | INTRAMUSCULAR | Status: DC | PRN

## 2015-08-11 MED ORDER — ONDANSETRON 8 MG PO TBDP
8.0000 mg | ORAL_TABLET | Freq: Once | ORAL | Status: AC
  Administered 2015-08-11: 8 mg via ORAL

## 2015-08-11 MED ORDER — OXYCODONE HCL 5 MG/5ML PO SOLN: 5.0000 mg | Freq: Once | ORAL | Status: DC | PRN

## 2015-08-11 MED ORDER — MIDAZOLAM HCL 2 MG/2ML IJ SOLN
INTRAMUSCULAR | Status: AC
  Filled 2015-08-11: qty 2

## 2015-08-11 MED ORDER — DEXAMETHASONE SODIUM PHOSPHATE 10 MG/ML IJ SOLN
INTRAMUSCULAR | Status: AC
  Filled 2015-08-11: qty 1

## 2015-08-11 MED ORDER — HYDROMORPHONE HCL 1 MG/ML IJ SOLN
INTRAMUSCULAR | Status: AC
  Filled 2015-08-11: qty 1

## 2015-08-11 MED ORDER — LIDOCAINE HCL (CARDIAC) 20 MG/ML IV SOLN
INTRAVENOUS | Status: DC | PRN
  Administered 2015-08-11: 100 mg via INTRAVENOUS

## 2015-08-11 MED ORDER — LACTATED RINGERS IV SOLN
INTRAVENOUS | Status: DC
  Administered 2015-08-11: 11:00:00 via INTRAVENOUS

## 2015-08-11 MED ORDER — FENTANYL CITRATE (PF) 100 MCG/2ML IJ SOLN
50.0000 ug | INTRAMUSCULAR | Status: DC | PRN
  Administered 2015-08-11: 100 ug via INTRAVENOUS

## 2015-08-11 MED ORDER — CLINDAMYCIN PHOSPHATE 300 MG/50ML IV SOLN
INTRAVENOUS | Status: AC
  Filled 2015-08-11: qty 50

## 2015-08-11 MED ORDER — GLYCOPYRROLATE 0.2 MG/ML IJ SOLN: 0.2000 mg | Freq: Once | INTRAMUSCULAR | Status: DC | PRN

## 2015-08-11 MED ORDER — ONDANSETRON 8 MG PO TBDP
ORAL_TABLET | ORAL | Status: AC
  Filled 2015-08-11: qty 1

## 2015-08-11 SURGICAL SUPPLY — 46 items
BANDAGE COBAN STERILE 2 (GAUZE/BANDAGES/DRESSINGS) IMPLANT
BLADE MINI RND TIP GREEN BEAV (BLADE) IMPLANT
BLADE SURG 15 STRL LF DISP TIS (BLADE) ×1 IMPLANT
BLADE SURG 15 STRL SS (BLADE)
BNDG CMPR 9X4 STRL LF SNTH (GAUZE/BANDAGES/DRESSINGS)
BNDG COHESIVE 1X5 TAN STRL LF (GAUZE/BANDAGES/DRESSINGS) IMPLANT
BNDG COHESIVE 4X5 TAN STRL (GAUZE/BANDAGES/DRESSINGS) ×2 IMPLANT
BNDG ESMARK 4X9 LF (GAUZE/BANDAGES/DRESSINGS) IMPLANT
BNDG GAUZE ELAST 4 BULKY (GAUZE/BANDAGES/DRESSINGS) ×4 IMPLANT
CHLORAPREP W/TINT 26ML (MISCELLANEOUS) ×3 IMPLANT
CORDS BIPOLAR (ELECTRODE) IMPLANT
COVER BACK TABLE 60X90IN (DRAPES) ×3 IMPLANT
COVER MAYO STAND STRL (DRAPES) ×3 IMPLANT
CUFF TOURNIQUET SINGLE 18IN (TOURNIQUET CUFF) ×2 IMPLANT
DRAPE C-ARM 42X72 X-RAY (DRAPES) ×3 IMPLANT
DRAPE EXTREMITY T 121X128X90 (DRAPE) ×3 IMPLANT
DRAPE SURG 17X23 STRL (DRAPES) ×3 IMPLANT
DRSG EMULSION OIL 3X3 NADH (GAUZE/BANDAGES/DRESSINGS) ×2 IMPLANT
GLOVE BIO SURGEON STRL SZ7.5 (GLOVE) ×3 IMPLANT
GLOVE BIOGEL PI IND STRL 7.0 (GLOVE) ×1 IMPLANT
GLOVE BIOGEL PI IND STRL 8 (GLOVE) ×1 IMPLANT
GLOVE BIOGEL PI INDICATOR 7.0 (GLOVE) ×4
GLOVE BIOGEL PI INDICATOR 8 (GLOVE) ×2
GLOVE ECLIPSE 6.5 STRL STRAW (GLOVE) ×5 IMPLANT
GOWN STRL REUS W/ TWL LRG LVL3 (GOWN DISPOSABLE) ×2 IMPLANT
GOWN STRL REUS W/TWL LRG LVL3 (GOWN DISPOSABLE) ×6
GOWN STRL REUS W/TWL XL LVL3 (GOWN DISPOSABLE) ×3 IMPLANT
K-WIRE .062X4 (WIRE) ×4 IMPLANT
NEEDLE HYPO 22GX1.5 SAFETY (NEEDLE) ×2 IMPLANT
NS IRRIG 1000ML POUR BTL (IV SOLUTION) ×3 IMPLANT
PACK BASIN DAY SURGERY FS (CUSTOM PROCEDURE TRAY) ×3 IMPLANT
PADDING CAST ABS 4INX4YD NS (CAST SUPPLIES)
PADDING CAST ABS COTTON 4X4 ST (CAST SUPPLIES) IMPLANT
PADDING UNDERCAST 2 STRL (CAST SUPPLIES)
PADDING UNDERCAST 2X4 STRL (CAST SUPPLIES) IMPLANT
RUBBERBAND STERILE (MISCELLANEOUS) IMPLANT
SPONGE GAUZE 4X4 12PLY STER LF (GAUZE/BANDAGES/DRESSINGS) ×3 IMPLANT
STOCKINETTE 6  STRL (DRAPES) ×2
STOCKINETTE 6 STRL (DRAPES) ×1 IMPLANT
SUT VICRYL RAPIDE 4-0 (SUTURE) IMPLANT
SUT VICRYL RAPIDE 4/0 PS 2 (SUTURE) IMPLANT
SYR BULB 3OZ (MISCELLANEOUS) IMPLANT
SYRINGE 10CC LL (SYRINGE) IMPLANT
TOWEL OR 17X24 6PK STRL BLUE (TOWEL DISPOSABLE) ×3 IMPLANT
TOWEL OR NON WOVEN STRL DISP B (DISPOSABLE) ×1 IMPLANT
UNDERPAD 30X30 (UNDERPADS AND DIAPERS) ×3 IMPLANT

## 2015-08-11 NOTE — Op Note (Signed)
08/11/2015  10:35 AM  PATIENT:  Anthony Finley  45 y.o. male  PRE-OPERATIVE DIAGNOSIS:  Displaced 4th MC fx  POST-OPERATIVE DIAGNOSIS:  Same  PROCEDURE:  CRPP L 4 MC fx with IMN  SURGEON: Rayvon Char. [place] Silos, MD  PHYSICIAN ASSISTANT: Morley Kos, OPA-C  ANESTHESIA:  general  SPECIMENS:  None  DRAINS:   None  EBL:  less than 50 mL  PREOPERATIVE INDICATIONS:  Anthony Finley is a  45 y.o. male with a displaced left fourth metacarpal shaft fracture.  The risks benefits and alternatives were discussed with the patient preoperatively including but not limited to the risks of infection, bleeding, nerve injury, cardiopulmonary complications, the need for revision surgery, among others, and the patient verbalized understanding and consented to proceed.  OPERATIVE IMPLANTS: 0.062 inch K wire 1 is an intramedullary nail  OPERATIVE PROCEDURE:  After receiving prophylactic antibiotics, the patient was escorted to the operative theatre and placed in a supine position.  General anesthesia was administered.  A surgical "time-out" was performed during which the planned procedure, proposed operative site, and the correct patient identity were compared to the operative consent and agreement confirmed by the circulating nurse according to current facility policy.  Following application of a tourniquet to the operative extremity, the exposed skin was pre-scrub with a Hibiclens scrub brush before being formally prepped with Chloraprep and draped in the usual sterile fashion.  A 0.062 inch K wire was driven percutaneously through the skin with fluoroscopic guidance to create an entry hole on the proximal dorsal aspect of the fourth metacarpal.  It was then withdrawn, and a second K wire that had had the ends clips to be blunt and one tip bent slightly was placed through the skin perforation, into the dorsal cortical window, and advanced through the shaft of the metacarpal.  The fracture was  reduced through closed manipulation and the intramedullary nail K wire was pushed across the fracture site and into the head of the metacarpal.  With the intramedullary device in place and its position confirmed fluoroscopically, the metacarpal was injected axial load which compressed the fracture at the fracture site and proximally the K wire was bent over and clipped outside the skin.  A short arm splint dressing was applied after final fluoroscopic images were obtained and he was awakened and taken to the recovery room stable condition, breathing spontaneously  DISPOSITION: He'll be discharged home today with typical instructions, returning in 10-15 days at which time we'll obtain new x-rays of the left hand out of the splint and place him into a short arm cast for an additional 3 weeks or so.

## 2015-08-11 NOTE — Anesthesia Preprocedure Evaluation (Signed)
Anesthesia Evaluation  Patient identified by MRN, date of birth, ID band Patient awake    Reviewed: Allergy & Precautions, NPO status , Patient's Chart, lab work & pertinent test results  Airway Mallampati: I  TM Distance: >3 FB Neck ROM: Full    Dental  (+) Poor Dentition, Dental Advisory Given, Teeth Intact   Pulmonary asthma , Current Smoker,    breath sounds clear to auscultation       Cardiovascular  Rhythm:Regular Rate:Normal     Neuro/Psych    GI/Hepatic   Endo/Other  Morbid obesity  Renal/GU      Musculoskeletal   Abdominal   Peds  Hematology   Anesthesia Other Findings   Reproductive/Obstetrics                             Anesthesia Physical Anesthesia Plan  ASA: III  Anesthesia Plan: General   Post-op Pain Management:    Induction: Intravenous  Airway Management Planned: LMA  Additional Equipment:   Intra-op Plan:   Post-operative Plan: Extubation in OR  Informed Consent: I have reviewed the patients History and Physical, chart, labs and discussed the procedure including the risks, benefits and alternatives for the proposed anesthesia with the patient or authorized representative who has indicated his/her understanding and acceptance.   Dental advisory given  Plan Discussed with: CRNA, Anesthesiologist and Surgeon  Anesthesia Plan Comments:         Anesthesia Quick Evaluation

## 2015-08-11 NOTE — Transfer of Care (Signed)
Immediate Anesthesia Transfer of Care Note  Patient: Anthony Finley  Procedure(s) Performed: Procedure(s): PINNING OF LEFT 4TH METACARPAL FRACTURE (Left)  Patient Location: PACU  Anesthesia Type:General  Level of Consciousness: awake, sedated and patient cooperative  Airway & Oxygen Therapy: Patient Spontanous Breathing and Patient connected to face mask oxygen  Post-op Assessment: Report given to RN and Post -op Vital signs reviewed and stable  Post vital signs: Reviewed and stable  Last Vitals:  Filed Vitals:   08/11/15 1025 08/11/15 1301  BP: 112/73   Pulse: 69 87  Temp: 36.5 C   Resp: 20 16    Complications: No apparent anesthesia complications

## 2015-08-11 NOTE — Discharge Instructions (Addendum)
Discharge Instructions ° ° °You have a dressing with a plaster splint incorporated in it. °Move your fingers as much as possible, making a full fist and fully opening the fist. °Elevate your hand to reduce pain & swelling of the digits.  Ice over the operative site may be helpful to reduce pain & swelling.  DO NOT USE HEAT. °Pain medicine has been prescribed for you.  °Use your medicine as needed over the first 48 hours, and then you can begin to taper your use.  You may use Tylenol in place of your prescribed pain medication, but not IN ADDITION to it. °Leave the dressing in place until you return to our office.  °You may shower, but keep the bandage clean & dry.  °You may drive a car when you are off of prescription pain medications and can safely control your vehicle with both hands. ° ° °Please call [phone] during normal business hours or [phone] after hours for any problems. Including the following: ° °- excessive redness of the incisions °- drainage for more than 4 days °- fever of more than 101.5 F ° °*Please note that pain medications will not be refilled after hours or on weekends. ° ° °Post Anesthesia Home Care Instructions ° °Activity: °Get plenty of rest for the remainder of the day. A responsible adult should stay with you for 24 hours following the procedure.  °For the next 24 hours, DO NOT: °-Drive a car °-Operate machinery °-Drink alcoholic beverages °-Take any medication unless instructed by your physician °-Make any legal decisions or sign important papers. ° °Meals: °Start with liquid foods such as gelatin or soup. Progress to regular foods as tolerated. Avoid greasy, spicy, heavy foods. If nausea and/or vomiting occur, drink only clear liquids until the nausea and/or vomiting subsides. Call your physician if vomiting continues. ° °Special Instructions/Symptoms: °Your throat may feel dry or sore from the anesthesia or the breathing tube placed in your throat during surgery. If this  causes discomfort, gargle with warm salt water. The discomfort should disappear within 24 hours. ° °If you had a scopolamine patch placed behind your ear for the management of post- operative nausea and/or vomiting: ° °1. The medication in the patch is effective for 72 hours, after which it should be removed.  Wrap patch in a tissue and discard in the trash. Wash hands thoroughly with soap and water. °2. You may remove the patch earlier than 72 hours if you experience unpleasant side effects which may include dry mouth, dizziness or visual disturbances. °3. Avoid touching the patch. Wash your hands with soap and water after contact with the patch. °  ° °

## 2015-08-11 NOTE — Anesthesia Postprocedure Evaluation (Signed)
Anesthesia Post Note  Patient: Anthony Finley  Procedure(s) Performed: Procedure(s) (LRB): PINNING OF LEFT 4TH METACARPAL FRACTURE (Left)  Patient location during evaluation: PACU Anesthesia Type: General Level of consciousness: awake and alert Pain management: pain level controlled Vital Signs Assessment: post-procedure vital signs reviewed and stable Respiratory status: spontaneous breathing, nonlabored ventilation and respiratory function stable Cardiovascular status: blood pressure returned to baseline and stable Postop Assessment: no signs of nausea or vomiting Anesthetic complications: no    Last Vitals:  Filed Vitals:   08/11/15 1345 08/11/15 1400  BP: 107/80 111/78  Pulse: 70 62  Temp:    Resp: 14 11    Last Pain:  Filed Vitals:   08/11/15 1411  PainSc: 2                  [name] A
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
## 2015-08-11 NOTE — Anesthesia Procedure Notes (Signed)
Procedure Name: LMA Insertion Date/Time: 08/11/2015 12:30 PM Performed by: Lyndee Leo Pre-anesthesia Checklist: Patient identified, Emergency Drugs available, Suction available and Patient being monitored Patient Re-evaluated:Patient Re-evaluated prior to inductionOxygen Delivery Method: Circle System Utilized Preoxygenation: Pre-oxygenation with 100% oxygen Intubation Type: IV induction Ventilation: Mask ventilation without difficulty LMA: LMA inserted LMA Size: 5.0 Number of attempts: 1 Airway Equipment and Method: Bite block Placement Confirmation: positive ETCO2 Tube secured with: Tape Dental Injury: Teeth and Oropharynx as per pre-operative assessment

## 2015-08-11 NOTE — Interval H&P Note (Signed)
History and Physical Interval Note:  08/11/2015 10:35 AM  Anthony Finley  has presented today for surgery, with the diagnosis of LEFT 4TH METACARPAL FRACTURE S62.325A  The various methods of treatment have been discussed with the patient and family. After consideration of risks, benefits and other options for treatment, the patient has consented to  Procedure(s): PINNING OF LEFT 4TH METACARPAL FRACTURE (Left) as a surgical intervention .  The patient's history has been reviewed, patient examined, no change in status, stable for surgery.  I have reviewed the patient's chart and labs.  Questions were answered to the patient's satisfaction.     [name] A.

## 2015-08-12 ENCOUNTER — Encounter (HOSPITAL_BASED_OUTPATIENT_CLINIC_OR_DEPARTMENT_OTHER): Payer: Self-pay | Admitting: Orthopedic Surgery

## 2015-10-17 ENCOUNTER — Encounter (HOSPITAL_COMMUNITY): Payer: Self-pay

## 2015-10-17 ENCOUNTER — Ambulatory Visit (HOSPITAL_COMMUNITY)
Admission: EM | Admit: 2015-10-17 | Discharge: 2015-10-17 | Disposition: A | Discharge status: Home / Self Care | Payer: Self-pay | Attending: Family Medicine | Admitting: Family Medicine

## 2015-10-17 DIAGNOSIS — L240 Irritant contact dermatitis due to detergents: Secondary | ICD-10-CM

## 2015-10-17 MED ORDER — FLUTICASONE PROPIONATE 0.05 % EX CREA: TOPICAL_CREAM | Freq: Two times a day (BID) | CUTANEOUS | Status: DC

## 2015-10-17 NOTE — ED Provider Notes (Signed)
CSN: [id number]     Arrival date & time 10/17/15  1744 History   First MD Initiated Contact with Patient 10/17/15 1756     Chief Complaint  Patient presents with  . Rash   (Consider location/radiation/quality/duration/timing/severity/associated sxs/prior Treatment) Patient is a 45 y.o. male presenting with rash. The history is provided by the patient.  Rash Location:  Hand Hand rash location:  Dorsum of L hand Quality: blistering, itchiness, peeling and swelling   Severity:  Mild Onset quality:  Sudden Duration:  3 days Progression:  Unchanged Chronicity:  New Context comment:  Onset after using degreaser chemical at work Relieved by:  Nothing Ineffective treatments:  Topical steroids Associated symptoms: no fever     Past Medical History  Diagnosis Date  . No pertinent past medical history   . Asthma     mostly as child-uses inh about q 3-4 months   Past Surgical History  Procedure Laterality Date  . Fracture surgery      orif fx lt ankle age 89  . Orif ankle fracture  02/22/2012    Procedure: OPEN REDUCTION INTERNAL FIXATION (ORIF) ANKLE FRACTURE;  Surgeon: Wylene Simmer, MD;  Location: [facility];  Service: Orthopedics;  Laterality: Left;  Open Reduction Internal Fixation Left Ankle Bimalleolar Fracture;   Open Reduction Internal Fixation of Syndesmosis, and Repair of Deltoid Ligament  . Percutaneous pinning Left 08/11/2015    Procedure: PINNING OF LEFT 4TH METACARPAL FRACTURE;  Surgeon: Milly Jakob, MD;  Location: [facility];  Service: Orthopedics;  Laterality: Left;  . Hand surgery Left 08/2015   No family history on file. Social History  Substance Use Topics  . Smoking status: Current Every Day Smoker -- 0.50 packs/day    Types: Cigarettes  . Smokeless tobacco: Never Used  . Alcohol Use: Yes     Comment: occ    Review of Systems  Constitutional: Negative.  Negative for fever.  Skin: Positive for rash.  All other systems reviewed  and are negative.   Allergies  Cephalosporins; Fish-derived products; and Keflex  Home Medications   Prior to Admission medications   Medication Sig Start Date End Date Taking? Authorizing Provider  albuterol (PROVENTIL HFA;VENTOLIN HFA) 108 (90 Base) MCG/ACT inhaler Inhale into the lungs every 6 (six) hours as needed for wheezing or shortness of breath.   Yes Historical Provider, MD  diphenhydrAMINE (BENADRYL) 25 MG tablet Take 25 mg by mouth at bedtime as needed for sleep.    Historical Provider, MD  HYDROcodone-acetaminophen (NORCO) 5-325 MG tablet Take 1 tablet by mouth every 6 (six) hours as needed for moderate pain. 08/07/15   Tatyana Kirichenko, PA-C  metroNIDAZOLE (FLAGYL) 500 MG tablet Take 1 tablet (500 mg total) by mouth 3 (three) times daily. 08/07/15   Tatyana Kirichenko, PA-C  naproxen (NAPROSYN) 500 MG tablet Take 1 tablet (500 mg total) by mouth 2 (two) times daily. 08/07/15   Tatyana Kirichenko, PA-C  oxyCODONE-acetaminophen (ROXICET) 5-325 MG tablet Take 1-2 tablets by mouth every 4 (four) hours as needed for severe pain. 08/11/15   Milly Jakob, MD   Meds Ordered and Administered this Visit  Medications - No data to display  BP 112/75 mmHg  Pulse 90  Temp(Src) 98.1 F (36.7 C) (Oral)  Resp 20  SpO2 96% No data found.   Physical Exam  Constitutional: He is oriented to person, place, and time. He appears well-developed and well-nourished.  Neurological: He is alert and oriented to person, place, and time.  Skin: Skin is warm and dry. Rash noted. There is erythema.  Local sts and papular eruption on dorsum of hand, no sign of infection, no erythema.  Nursing note and vitals reviewed.   ED Course  Procedures (including critical care time)  Labs Review Labs Reviewed - No data to display  Imaging Review No results found.   Visual Acuity Review  Right Eye Distance:   Left Eye Distance:   Bilateral Distance:    Right Eye Near:   Left Eye Near:    Bilateral  Near:         MDM  No diagnosis found.  Meds ordered this encounter  Medications  . fluticasone (CUTIVATE) 0.05 % cream    Sig: Apply topically 2 (two) times daily.    Dispense:  30 g    Refill:  0      Billy Fischer, MD 10/17/15 [phone]

## 2015-10-17 NOTE — ED Notes (Signed)
Patient presents with rash on back of left hand, he states he was using degreaser at work and now has developed a rash 3-days ago, pt has applied hydrocortisone cream to affected area  No acute distress

## 2015-12-07 ENCOUNTER — Emergency Department (HOSPITAL_COMMUNITY)
Admission: EM | Admit: 2015-12-07 | Discharge: 2015-12-07 | Disposition: A | Discharge status: Home / Self Care | Payer: Self-pay | Attending: Emergency Medicine | Admitting: Emergency Medicine

## 2015-12-07 ENCOUNTER — Encounter (HOSPITAL_COMMUNITY): Payer: Self-pay | Admitting: *Deleted

## 2015-12-07 DIAGNOSIS — F17210 Nicotine dependence, cigarettes, uncomplicated: Secondary | ICD-10-CM | POA: Insufficient documentation

## 2015-12-07 DIAGNOSIS — K029 Dental caries, unspecified: Secondary | ICD-10-CM | POA: Insufficient documentation

## 2015-12-07 DIAGNOSIS — Z720 Tobacco use: Secondary | ICD-10-CM

## 2015-12-07 DIAGNOSIS — K047 Periapical abscess without sinus: Secondary | ICD-10-CM | POA: Insufficient documentation

## 2015-12-07 DIAGNOSIS — J45909 Unspecified asthma, uncomplicated: Secondary | ICD-10-CM | POA: Insufficient documentation

## 2015-12-07 DIAGNOSIS — Z79899 Other long term (current) drug therapy: Secondary | ICD-10-CM | POA: Insufficient documentation

## 2015-12-07 MED ORDER — HYDROCODONE-ACETAMINOPHEN 5-325 MG PO TABS: 1.0000 | ORAL_TABLET | Freq: Four times a day (QID) | ORAL | 0 refills | Status: DC | PRN

## 2015-12-07 MED ORDER — NAPROXEN 500 MG PO TABS: 500.0000 mg | ORAL_TABLET | Freq: Two times a day (BID) | ORAL | 0 refills | Status: DC | PRN

## 2015-12-07 MED ORDER — HYDROCODONE-ACETAMINOPHEN 5-325 MG PO TABS
1.0000 | ORAL_TABLET | Freq: Once | ORAL | Status: AC
  Administered 2015-12-07: 1 via ORAL
  Filled 2015-12-07: qty 1

## 2015-12-07 MED ORDER — DOXYCYCLINE HYCLATE 100 MG PO CAPS: 100.0000 mg | ORAL_CAPSULE | Freq: Two times a day (BID) | ORAL | 0 refills | Status: DC

## 2015-12-07 NOTE — Discharge Instructions (Signed)
Apply warm compresses to jaw throughout the day. Take antibiotic until finished. Use tylenol or naprosyn as needed for pain, or use norco for additional pain relief but don't drive while taking norco. Perform salt water swishes to help with pain/swelling. Followup with a dentist is very important for ongoing evaluation and management of recurrent dental pain, call the dentist listed above in the next 24-48 hours to schedule ongoing dental care, or use the list below to find a dentist. Return to emergency department for emergent changing or worsening symptoms. STOP SMOKING!    Emergency Department Resource Guide 1) Find a Doctor and Pay Out of Pocket Although you won't have to find out who is covered by your insurance plan, it is a good idea to ask around and get recommendations. You will then need to call the office and see if the doctor you have chosen will accept you as a new patient and what types of options they offer for patients who are self-pay. Some doctors offer discounts or will set up payment plans for their patients who do not have insurance, but you will need to ask so you aren't surprised when you get to your appointment.  2) Contact Your Local Health Department Not all health departments have doctors that can see patients for sick visits, but many do, so it is worth a call to see if yours does. If you don't know where your local health department is, you can check in your phone book. The CDC also has a tool to help you locate your state's health department, and many state websites also have listings of all of their local health departments.  3) Find a [facility] If your illness is not likely to be very severe or complicated, you may want to try a walk in clinic. These are popping up all over the country in pharmacies, drugstores, and shopping centers. They're usually staffed by nurse practitioners or physician assistants that have been trained to treat common illnesses and complaints.  They're usually fairly quick and inexpensive. However, if you have serious medical issues or chronic medical problems, these are probably not your best option.  No Primary Care Doctor: - Call Health Connect at  [phone] - they can help you locate a primary care doctor that  accepts your insurance, provides certain services, etc. - Physician Referral Service- [phone]  Chronic Pain Problems: Organization         Address  Phone   Notes  [facility]  [phone] Patients need to be referred by their primary care doctor.   Medication Assistance: Organization         Address  Phone   Notes  [facility] Medication [facility] [address] [place], [place] [address] [phone] --Must be a resident of [facility] -- Must have NO insurance coverage whatsoever (no Medicaid/ Medicare, etc.) -- The pt. MUST have a primary care doctor that directs their care regularly and follows them in the community   [facility]  [phone]   [place]  [phone]     Dental Care: Organization         Address  Phone  Notes  [facility] Department of [facility] [place] [phone] Accepts children up to age 49 who are enrolled in [place] or [place]; pregnant women with a Medicaid card; and children who have applied for Medicaid or [place] Health Choice, but were declined,  whose parents can pay a reduced fee at time of service.  [facility] Department of [facility]  [address], [place] [phone] Accepts children up to age 49 who are enrolled in [place] or [place]; pregnant women with a Medicaid card; and children who have applied for Medicaid or  Health Choice, but were declined, whose parents can pay a reduced fee at time of service.  [facility] Adult [facility] Access PRO[facility]  [place] [phone] Patients are seen by  appointment only. Walk-ins are not accepted. [place] will see patients 29 years of age and older. Monday - Tuesday (8am-5pm) Most Wednesdays (8:30-5pm) $30 per visit, cash only  [facility] Adult Dental Access PROGRAM  [address], [facility] [phone] Patients are seen by appointment only. Walk-ins are not accepted. [place] will see patients 49 years of age and older. One Wednesday Evening (Monthly: Volunteer Based).  $30 per visit, cash only  [place]  [phone] for adults; Children under age 64, call Graduate Pediatric Dentistry at [phone]. Children aged 78-14, please call [phone] to request a pediatric application.  Dental services are provided in all areas of dental care including fillings, crowns and bridges, complete and partial dentures, implants, gum treatment, root canals, and extractions. Preventive care is also provided. Treatment is provided to both adults and children. Patients are selected via a lottery and there is often a waiting list.   [facility] [address] [place]  [phone] [email or web address]   [facility] [address] [place], [place] [phone], Ext. [phone] Second and Fourth Thursday of each month, opens at 6:30 AM; Clinic ends at 9 AM.  Patients are seen on a first-come first-served basis, and a limited number are seen during each clinic.   [facility]  [address] [place] Danker [place], [place] [phone]   Eligibility Requirements You must have lived in [place], [place], or [place] counties for at least the last three months.   You cannot be eligible for state or federal sponsored [employer], including [employer], [place], or [employer].   You generally cannot be eligible for healthcare insurance through your employer.    How to apply: Eligibility screenings are held every Tuesday and Wednesday afternoon from 1:00 pm until 4:00 pm. You  do not need an appointment for the interview!  [facility] [address] [place], [place]   [place]  [place]  [place]  [phone]

## 2015-12-07 NOTE — ED Provider Notes (Signed)
[facility] DEPT Provider Note   CSN: [id number] Arrival date & time: 12/07/15  [id number]  First Provider Contact:  First MD Initiated Contact with Patient 12/07/15 1026        History   Chief Complaint Chief Complaint  Patient presents with  . Dental Pain    HPI Anthony Finley is a 45 y.o. male with a PMHx of asthma, who presents to the ED with complaints of left upper and lower dental pain 3 days. He reports his pain is 10/10 constant sharp nonradiating pain in the left upper and lower molars, worse with chewing, and unrelieved with Aleve, BC powder, Goody's powders, and ice. Associated symptoms include left facial swelling. He has made an appointment with his dentist on Thursday, but his pain was so severe that he could not wait until then. Positive smoker.  He denies any fevers, chills, trismus, gum swelling or drainage, ear pain or drainage, drooling, neck swelling, rhinorrhea, sore throat, chest pain, shortness breath, abdominal pain, nausea, vomiting, diarrhea, constipation, dysuria, hematuria, numbness, tingling, or focal weakness. States that the teeth have decayed and one is broken.   The history is provided by the patient and medical records. No language interpreter was used.  Dental Pain   This is a new problem. The current episode started more than 2 days ago. The problem occurs constantly. The problem has been gradually worsening. The pain is at a severity of 10/10. The pain is severe. He has tried ice (and [place], [employer], and Goody's powders) for the symptoms. The treatment provided no relief.    Past Medical History:  Diagnosis Date  . Asthma    mostly as child-uses inh about q 3-4 months  . No pertinent past medical history     There are no active problems to display for this patient.   Past Surgical History:  Procedure Laterality Date  . FRACTURE SURGERY     orif fx lt ankle age 47  . HAND SURGERY Left 08/2015  . ORIF ANKLE FRACTURE  02/22/2012   Procedure: OPEN REDUCTION INTERNAL FIXATION (ORIF) ANKLE FRACTURE;  Surgeon: Wylene Simmer, MD;  Location: [facility];  Service: Orthopedics;  Laterality: Left;  Open Reduction Internal Fixation Left Ankle Bimalleolar Fracture;   Open Reduction Internal Fixation of Syndesmosis, and Repair of Deltoid Ligament  . PERCUTANEOUS PINNING Left 08/11/2015   Procedure: PINNING OF LEFT 4TH METACARPAL FRACTURE;  Surgeon: Milly Jakob, MD;  Location: [facility];  Service: Orthopedics;  Laterality: Left;       Home Medications    Prior to Admission medications   Medication Sig Start Date End Date Taking? Authorizing Provider  albuterol (PROVENTIL HFA;VENTOLIN HFA) 108 (90 Base) MCG/ACT inhaler Inhale into the lungs every 6 (six) hours as needed for wheezing or shortness of breath.    Historical Provider, MD  diphenhydrAMINE (BENADRYL) 25 MG tablet Take 25 mg by mouth at bedtime as needed for sleep.    Historical Provider, MD  fluticasone (CUTIVATE) 0.05 % cream Apply topically 2 (two) times daily. 10/17/15   Billy Fischer, MD  HYDROcodone-acetaminophen (NORCO) 5-325 MG tablet Take 1 tablet by mouth every 6 (six) hours as needed for moderate pain. 08/07/15   Tatyana Kirichenko, PA-C  metroNIDAZOLE (FLAGYL) 500 MG tablet Take 1 tablet (500 mg total) by mouth 3 (three) times daily. 08/07/15   Tatyana Kirichenko, PA-C  naproxen (NAPROSYN) 500 MG tablet Take 1 tablet (500 mg total) by mouth 2 (two) times daily. 08/07/15  Tatyana Kirichenko, PA-C  oxyCODONE-acetaminophen (ROXICET) 5-325 MG tablet Take 1-2 tablets by mouth every 4 (four) hours as needed for severe pain. 08/11/15   Milly Jakob, MD    Family History No family history on file.  Social History Social History  Substance Use Topics  . Smoking status: Current Every Day Smoker    Packs/day: 0.50    Types: Cigarettes  . Smokeless tobacco: Never Used  . Alcohol use Yes     Comment: occ     Allergies   Cephalosporins;  Fish-derived products; and Keflex [cephalexin]   Review of Systems Review of Systems  Constitutional: Negative for chills and fever.  HENT: Positive for dental problem and facial swelling. Negative for drooling, ear discharge, ear pain, rhinorrhea, sore throat and trouble swallowing.   Respiratory: Negative for shortness of breath.   Cardiovascular: Negative for chest pain.  Gastrointestinal: Negative for abdominal pain, constipation, diarrhea, nausea and vomiting.  Genitourinary: Negative for dysuria and hematuria.  Musculoskeletal: Negative for arthralgias and myalgias.  Skin: Negative for color change.  Allergic/Immunologic: Negative for immunocompromised state.  Neurological: Negative for weakness and numbness.  Psychiatric/Behavioral: Negative for confusion.   10 Systems reviewed and are negative for acute change except as noted in the HPI.   Physical Exam Updated Vital Signs BP 133/89 (BP Location: Left Arm)   Pulse 87   Temp 98.6 F (37 C) (Oral)   Resp 16   Ht 6' (1.829 m)   Wt 113.4 kg   SpO2 96%   BMI 33.91 kg/m   Physical Exam  Constitutional: He is oriented to person, place, and time. Vital signs are normal. He appears well-developed and well-nourished.  Non-toxic appearance. No distress.  Afebrile, nontoxic, NAD  HENT:  Head: Normocephalic and atraumatic.  Nose: Nose normal.  Mouth/Throat: Uvula is midline, oropharynx is clear and moist and mucous membranes are normal. No trismus in the jaw. Dental caries present. No dental abscesses or uvula swelling.    Diffuse dental decay, L lower molar #18-19 and L upper molar #15 decayed and with minimal surrounding erythema without swelling or focal abscess, no evidence of ludwig's. No facial swelling appreciated. Nose clear. Oropharynx clear and moist, without uvular swelling or deviation, no trismus or drooling, no tonsillar swelling or erythema, no exudates.  Eyes: Conjunctivae and EOM are normal. Right eye exhibits  no discharge. Left eye exhibits no discharge.  Neck: Normal range of motion. Neck supple.  Cardiovascular: Normal rate.   Pulmonary/Chest: Effort normal. No respiratory distress.  Abdominal: Normal appearance. He exhibits no distension.  Musculoskeletal: Normal range of motion.  Lymphadenopathy:       Head (left side): Submandibular adenopathy present.  L reactive submandibular LAD  Neurological: He is alert and oriented to person, place, and time. He has normal strength. No sensory deficit.  Skin: Skin is warm, dry and intact. No rash noted.  Psychiatric: He has a normal mood and affect.  Nursing note and vitals reviewed.    ED Treatments / Results  Labs (all labs ordered are listed, but only abnormal results are displayed) Labs Reviewed - No data to display  EKG  EKG Interpretation None       Radiology No results found.  Procedures Procedures (including critical care time)  Medications Ordered in ED Medications  HYDROcodone-acetaminophen (NORCO/VICODIN) 5-325 MG per tablet 1 tablet (not administered)     Initial Impression / Assessment and Plan / ED Course  I have reviewed the triage vital signs and the nursing notes.  Pertinent labs & imaging results that were available during my care of the patient were reviewed by me and considered in my medical decision making (see chart for details).  Clinical Course    45 y.o. male here with Dental pain associated with dental decay and possible dental infection with patient afebrile, non toxic appearing and swallowing secretions well. Pt has dentist appt, I stressed the importance of dental follow up for ultimate management of dental pain.  I have also discussed reasons to return immediately to the ER.  Patient expresses understanding and agrees with plan.  I will also give doxycycline and pain control. Smoking cessation advised.   Final Clinical Impressions(s) / ED Diagnoses   Final diagnoses:  Pain due to dental caries    Dental infection  Dental decay  Tobacco use    New Prescriptions New Prescriptions   DOXYCYCLINE (VIBRAMYCIN) 100 MG CAPSULE    Take 1 capsule (100 mg total) by mouth 2 (two) times daily. One po bid x 7 days   HYDROCODONE-ACETAMINOPHEN (NORCO) 5-325 MG TABLET    Take 1 tablet by mouth every 6 (six) hours as needed for severe pain.   NAPROXEN (NAPROSYN) 500 MG TABLET    Take 1 tablet (500 mg total) by mouth 2 (two) times daily as needed for mild pain, moderate pain or headache (TAKE WITH MEALS.).     [address] Gilson Chalco, PA-C 12/07/15 1044    Carmin Muskrat, MD 12/07/15 1524

## 2015-12-07 NOTE — Progress Notes (Signed)
Pt given an ice pack for the left side of his mouth. He has been c/o sever tooth pain worse this am. Pain pt stated, "it is a 20."Family member at the bedside. Pt denies any fevers.

## 2015-12-07 NOTE — ED Triage Notes (Signed)
Pt complains of toothache for the past 2 days. Pt states he has tried aleve, BC powder, salt water gargle with no relief.

## 2016-11-06 ENCOUNTER — Encounter (HOSPITAL_COMMUNITY): Payer: Self-pay | Admitting: Emergency Medicine

## 2016-11-06 ENCOUNTER — Emergency Department (HOSPITAL_COMMUNITY)
Admission: EM | Admit: 2016-11-06 | Discharge: 2016-11-06 | Disposition: A | Discharge status: Home / Self Care | Payer: Self-pay | Attending: Emergency Medicine | Admitting: Emergency Medicine

## 2016-11-06 ENCOUNTER — Encounter (HOSPITAL_COMMUNITY): Payer: Self-pay

## 2016-11-06 DIAGNOSIS — F17210 Nicotine dependence, cigarettes, uncomplicated: Secondary | ICD-10-CM | POA: Insufficient documentation

## 2016-11-06 DIAGNOSIS — H9202 Otalgia, left ear: Secondary | ICD-10-CM | POA: Insufficient documentation

## 2016-11-06 DIAGNOSIS — T782XXA Anaphylactic shock, unspecified, initial encounter: Secondary | ICD-10-CM | POA: Insufficient documentation

## 2016-11-06 DIAGNOSIS — T361X5A Adverse effect of cephalosporins and other beta-lactam antibiotics, initial encounter: Secondary | ICD-10-CM | POA: Insufficient documentation

## 2016-11-06 DIAGNOSIS — J45909 Unspecified asthma, uncomplicated: Secondary | ICD-10-CM | POA: Insufficient documentation

## 2016-11-06 DIAGNOSIS — J45998 Other asthma: Secondary | ICD-10-CM | POA: Insufficient documentation

## 2016-11-06 MED ORDER — EPINEPHRINE 0.3 MG/0.3ML IJ SOAJ: 0.3000 mg | Freq: Once | INTRAMUSCULAR | 0 refills | Status: DC | PRN

## 2016-11-06 MED ORDER — METHYLPREDNISOLONE SODIUM SUCC 125 MG IJ SOLR
125.0000 mg | Freq: Once | INTRAMUSCULAR | Status: AC
  Administered 2016-11-06: 125 mg via INTRAVENOUS

## 2016-11-06 MED ORDER — IBUPROFEN 800 MG PO TABS
800.0000 mg | ORAL_TABLET | Freq: Once | ORAL | Status: AC
  Administered 2016-11-06: 800 mg via ORAL
  Filled 2016-11-06: qty 1

## 2016-11-06 MED ORDER — HYDROGEN PEROXIDE 3 % EX SOLN
CUTANEOUS | Status: AC
  Filled 2016-11-06: qty 473

## 2016-11-06 MED ORDER — SODIUM CHLORIDE 0.9 % IV BOLUS (SEPSIS)
1000.0000 mL | Freq: Once | INTRAVENOUS | Status: AC
  Administered 2016-11-06: 1000 mL via INTRAVENOUS

## 2016-11-06 MED ORDER — AMOXICILLIN 500 MG PO CAPS: 500.0000 mg | ORAL_CAPSULE | Freq: Three times a day (TID) | ORAL | 0 refills | Status: DC

## 2016-11-06 MED ORDER — AMOXICILLIN 500 MG PO CAPS
500.0000 mg | ORAL_CAPSULE | Freq: Once | ORAL | Status: AC
  Administered 2016-11-06: 500 mg via ORAL
  Filled 2016-11-06: qty 1

## 2016-11-06 MED ORDER — EPINEPHRINE 0.3 MG/0.3ML IJ SOAJ
0.3000 mg | Freq: Once | INTRAMUSCULAR | Status: AC
  Administered 2016-11-06: 0.3 mg via INTRAMUSCULAR

## 2016-11-06 MED ORDER — ALBUTEROL SULFATE (2.5 MG/3ML) 0.083% IN NEBU
INHALATION_SOLUTION | RESPIRATORY_TRACT | Status: AC
  Administered 2016-11-06: 5 mg via RESPIRATORY_TRACT
  Filled 2016-11-06: qty 6

## 2016-11-06 MED ORDER — FAMOTIDINE IN NACL 20-0.9 MG/50ML-% IV SOLN
20.0000 mg | Freq: Once | INTRAVENOUS | Status: AC
  Administered 2016-11-06: 20 mg via INTRAVENOUS
  Filled 2016-11-06: qty 50

## 2016-11-06 MED ORDER — ALBUTEROL SULFATE (2.5 MG/3ML) 0.083% IN NEBU
5.0000 mg | INHALATION_SOLUTION | Freq: Once | RESPIRATORY_TRACT | Status: AC
  Administered 2016-11-06: 5 mg via RESPIRATORY_TRACT

## 2016-11-06 MED ORDER — AZITHROMYCIN 250 MG PO TABS: 250.0000 mg | ORAL_TABLET | Freq: Every day | ORAL | 0 refills | Status: DC

## 2016-11-06 NOTE — ED Notes (Signed)
Bed: WTR5 Expected date:  Expected time:  Means of arrival:  Comments: 

## 2016-11-06 NOTE — Discharge Instructions (Signed)
Take the prescribed medication as directed.  Can continue tylenol or motrin as needed for pain. Follow-up with your primary care doctor. Return to the ED for new or worsening symptoms.

## 2016-11-06 NOTE — ED Triage Notes (Signed)
Pt from home after being seen here earlier this morning. Pt was given amoxicillin and ibuprofen at visit around 0645. Pt arrived home and started to have difficulty breathing and hives. Pt reports that he took 4 benadryl PTA. Pt was given epi-pen, 125 solumedrol, 5 albuterol and placed on O2. PT is A&O and in NAD.

## 2016-11-06 NOTE — ED Provider Notes (Signed)
[facility] DEPT Provider Note   CSN: [id number] Arrival date & time: 11/06/16  0816     History   Chief Complaint Chief Complaint  Patient presents with  . Allergic Reaction    HPI Anthony Finley is a 46 y.o. male.  HPI   46 year old male patient of anaphylaxis. Patient was seen in the emergency room earlier this morning. He was diagnosed with an ear infection and received a dose of amoxicillin prior to leavin around 6 AM. Approximately 1 hour later he began having diffuse itching. Wheezing and shortness of breath. He has a past history of what sounds like mild intermittent asthma. He feels lightheaded. No syncope. No GI complaints. The only new exposure that he is aware of was a amoxicillin. He does have a listed allergy to cephalosporins. He took 100 mg of Benadryl just prior to coming to the emergency room.    Past Medical History:  Diagnosis Date  . Asthma    mostly as child-uses inh about q 3-4 months  . No pertinent past medical history     There are no active problems to display for this patient.   Past Surgical History:  Procedure Laterality Date  . FRACTURE SURGERY     orif fx lt ankle age 3  . HAND SURGERY Left 08/2015  . ORIF ANKLE FRACTURE  02/22/2012   Procedure: OPEN REDUCTION INTERNAL FIXATION (ORIF) ANKLE FRACTURE;  Surgeon: Wylene Simmer, MD;  Location: [facility];  Service: Orthopedics;  Laterality: Left;  Open Reduction Internal Fixation Left Ankle Bimalleolar Fracture;   Open Reduction Internal Fixation of Syndesmosis, and Repair of Deltoid Ligament  . PERCUTANEOUS PINNING Left 08/11/2015   Procedure: PINNING OF LEFT 4TH METACARPAL FRACTURE;  Surgeon: Milly Jakob, MD;  Location: [facility];  Service: Orthopedics;  Laterality: Left;       Home Medications    Prior to Admission medications   Medication Sig Start Date End Date Taking? Authorizing Provider  albuterol (PROVENTIL HFA;VENTOLIN HFA) 108 (90 Base)  MCG/ACT inhaler Inhale into the lungs every 6 (six) hours as needed for wheezing or shortness of breath.    [provider]  amoxicillin (AMOXIL) 500 MG capsule Take 1 capsule (500 mg total) by mouth 3 (three) times daily. 11/06/16   Larene Pickett, PA-C  diphenhydrAMINE (BENADRYL) 25 MG tablet Take 25 mg by mouth at bedtime as needed for sleep.    [provider]  doxycycline (VIBRAMYCIN) 100 MG capsule Take 1 capsule (100 mg total) by mouth 2 (two) times daily. One po bid x 7 days 12/07/15   Street, [place], PA-C  fluticasone (CUTIVATE) 0.05 % cream Apply topically 2 (two) times daily. 10/17/15   Billy Fischer, MD  HYDROcodone-acetaminophen (NORCO) 5-325 MG tablet Take 1 tablet by mouth every 6 (six) hours as needed for moderate pain. 08/07/15   Kirichenko, Lahoma Rocker, PA-C  HYDROcodone-acetaminophen (NORCO) 5-325 MG tablet Take 1 tablet by mouth every 6 (six) hours as needed for severe pain. 12/07/15   Street, Mercedes, PA-C  metroNIDAZOLE (FLAGYL) 500 MG tablet Take 1 tablet (500 mg total) by mouth 3 (three) times daily. 08/07/15   Kirichenko, Tatyana, PA-C  naproxen (NAPROSYN) 500 MG tablet Take 1 tablet (500 mg total) by mouth 2 (two) times daily. 08/07/15   Kirichenko, Tatyana, PA-C  naproxen (NAPROSYN) 500 MG tablet Take 1 tablet (500 mg total) by mouth 2 (two) times daily as needed for mild pain, moderate pain or headache (TAKE WITH MEALS.). 12/07/15  Street, [place], PA-C  oxyCODONE-acetaminophen (ROXICET) 5-325 MG tablet Take 1-2 tablets by mouth every 4 (four) hours as needed for severe pain. 08/11/15   Milly Jakob, MD    Family History No family history on file.  Social History Social History  Substance Use Topics  . Smoking status: Current Every Day Smoker    Packs/day: 0.50    Types: Cigarettes  . Smokeless tobacco: Never Used  . Alcohol use Yes     Comment: occ     Allergies   Cephalosporins; Fish-derived products; and Keflex [cephalexin]   Review of  Systems Review of Systems  All systems reviewed and negative, other than as noted in HPI.  Physical Exam Updated Vital Signs BP 114/84 (BP Location: Right Arm)   Pulse (!) 102   Resp (!) 22   Ht 5' 11.5" (1.816 m)   Wt (!) 240 kg (529 lb 1.7 oz)   SpO2 93%   BMI 72.77 kg/m   Physical Exam  Constitutional: He appears well-developed and well-nourished. No distress.  HENT:  Head: Normocephalic and atraumatic.  Eyes: Conjunctivae are normal. Right eye exhibits no discharge. Left eye exhibits no discharge.  Neck: Neck supple.  Cardiovascular: Regular rhythm and normal heart sounds.  Exam reveals no gallop and no friction rub.   No murmur heard. tachycardic  Pulmonary/Chest: No respiratory distress. He has wheezes.  tachypnea  Abdominal: Soft. He exhibits no distension. There is no tenderness.  Musculoskeletal: He exhibits no edema or tenderness.  Neurological: He is alert.  Skin: Skin is warm and dry. Rash noted.  Psychiatric: He has a normal mood and affect. His behavior is normal. Thought content normal.  Nursing note and vitals reviewed.    ED Treatments / Results  Labs (all labs ordered are listed, but only abnormal results are displayed) Labs Reviewed - No data to display  EKG  EKG Interpretation None       Radiology No results found.  Procedures Procedures (including critical care time)  CRITICAL CARE Performed by: Virgel Manifold Total critical care time: 35 minutes Critical care time was exclusive of separately billable procedures and treating other patients. Critical care was necessary to treat or prevent imminent or life-threatening deterioration. Critical care was time spent personally by me on the following activities: development of treatment plan with patient and/or surrogate as well as nursing, discussions with consultants, evaluation of patient's response to treatment, examination of patient, obtaining history from patient or surrogate, ordering  and performing treatments and interventions, ordering and review of laboratory studies, ordering and review of radiographic studies, pulse oximetry and re-evaluation of patient's condition.   Medications Ordered in ED Medications  famotidine (PEPCID) IVPB 20 mg premix (20 mg Intravenous New Bag/Given 11/06/16 0836)  sodium chloride 0.9 % bolus 1,000 mL (not administered)  EPINEPHrine (EPI-PEN) injection 0.3 mg (0.3 mg Intramuscular Given 11/06/16 0818)  methylPREDNISolone sodium succinate (SOLU-MEDROL) 125 mg/2 mL injection 125 mg (125 mg Intravenous Given 11/06/16 0819)  albuterol (PROVENTIL) (2.5 MG/3ML) 0.083% nebulizer solution 5 mg (5 mg Nebulization Given 11/06/16 5885)     Initial Impression / Assessment and Plan / ED Course  I have reviewed the triage vital signs and the nursing notes.  Pertinent labs & imaging results that were available during my care of the patient were reviewed by me and considered in my medical decision making (see chart for details).  Clinical Course as of Nov 08 822  Sat Nov 06, 2016  0277 Appears more comfortable. After epi HR  actually decreasing into 80s. Remains normotensive. Looks more comfortable. He says he is feeling better. Currently getting albuterol neb. NS infusing. Will continue to watch closely. Plan at this point is to observe for a few hours. Continue interventions as needed. Fiance at bedside.   [SK]  0918 Pt resting. Doesn't appear distressed. Says breathing feels better. Vitals remain ok.   [SK]  1020 Snoring in bed. Awakens easily. No further complaints.   [SK]    Clinical Course User Index [SK] Virgel Manifold, MD    46 year old male with anaphylaxis. Likely secondary to amoxicillin. Known cephalosporin allergy.  He took 100 mg of Benadryl just prior to arrival to the emergency room.   On exam, he seems somewhat uncomfortable. Diffuse urticaria. Audible wheezing with oxygen saturations in the high 80s. Normotensive. He was given a dose of IM  epinephrine. Fluid bolus. Albuterol. H2 blocker. At this point after several checks of his blood pressure he remains normotensive. His oxygen saturations improving. Patient actually says he is feeling better and objectively he looks less distressed as well. We'll continue to monitor.Patient aware that he now has to list penicillin as allergy too.  Feels much better. HD stable. Has been observed for several hours. DC with epi pens and new class of abx.   Final Clinical Impressions(s) / ED Diagnoses   Final diagnoses:  Anaphylaxis, initial encounter    New Prescriptions New Prescriptions   No medications on file     Virgel Manifold, MD 11/07/16 [phone]

## 2016-11-06 NOTE — ED Provider Notes (Signed)
[facility] DEPT Provider Note   CSN: [id number] Arrival date & time: 11/06/16  0252     History   Chief Complaint Chief Complaint  Patient presents with  . Otalgia    HPI KAYVAN HOEFLING is a 46 y.o. male.  The history is provided by the patient and medical records.  Otalgia     46 year old male with history of asthma, presenting to the ED with left ear pain. Patient states yesterday he started noticing that his ear was itching quite a bit so he tried to rub inside his ear with a Q-tip. States afterwards he had a lot of pain. States he can hear normally this left ear. He has not had any other upper respiratory symptoms such as sore throat or cough. Nasal congestion. No fever or chills. No issues with the right ear.  Past Medical History:  Diagnosis Date  . Asthma    mostly as child-uses inh about q 3-4 months  . No pertinent past medical history     There are no active problems to display for this patient.   Past Surgical History:  Procedure Laterality Date  . FRACTURE SURGERY     orif fx lt ankle age 33  . HAND SURGERY Left 08/2015  . ORIF ANKLE FRACTURE  02/22/2012   Procedure: OPEN REDUCTION INTERNAL FIXATION (ORIF) ANKLE FRACTURE;  Surgeon: Wylene Simmer, MD;  Location: [facility];  Service: Orthopedics;  Laterality: Left;  Open Reduction Internal Fixation Left Ankle Bimalleolar Fracture;   Open Reduction Internal Fixation of Syndesmosis, and Repair of Deltoid Ligament  . PERCUTANEOUS PINNING Left 08/11/2015   Procedure: PINNING OF LEFT 4TH METACARPAL FRACTURE;  Surgeon: Milly Jakob, MD;  Location: [facility];  Service: Orthopedics;  Laterality: Left;       Home Medications    Prior to Admission medications   Medication Sig Start Date End Date Taking? Authorizing Provider  albuterol (PROVENTIL HFA;VENTOLIN HFA) 108 (90 Base) MCG/ACT inhaler Inhale into the lungs every 6 (six) hours as needed for wheezing or shortness of  breath.    [provider]  diphenhydrAMINE (BENADRYL) 25 MG tablet Take 25 mg by mouth at bedtime as needed for sleep.    [provider]  doxycycline (VIBRAMYCIN) 100 MG capsule Take 1 capsule (100 mg total) by mouth 2 (two) times daily. One po bid x 7 days 12/07/15   Street, [place], PA-C  fluticasone (CUTIVATE) 0.05 % cream Apply topically 2 (two) times daily. 10/17/15   Billy Fischer, MD  HYDROcodone-acetaminophen (NORCO) 5-325 MG tablet Take 1 tablet by mouth every 6 (six) hours as needed for moderate pain. 08/07/15   Kirichenko, Lahoma Rocker, PA-C  HYDROcodone-acetaminophen (NORCO) 5-325 MG tablet Take 1 tablet by mouth every 6 (six) hours as needed for severe pain. 12/07/15   Street, Mercedes, PA-C  metroNIDAZOLE (FLAGYL) 500 MG tablet Take 1 tablet (500 mg total) by mouth 3 (three) times daily. 08/07/15   Kirichenko, Tatyana, PA-C  naproxen (NAPROSYN) 500 MG tablet Take 1 tablet (500 mg total) by mouth 2 (two) times daily. 08/07/15   Kirichenko, Tatyana, PA-C  naproxen (NAPROSYN) 500 MG tablet Take 1 tablet (500 mg total) by mouth 2 (two) times daily as needed for mild pain, moderate pain or headache (TAKE WITH MEALS.). 12/07/15   Street, [place], PA-C  oxyCODONE-acetaminophen (ROXICET) 5-325 MG tablet Take 1-2 tablets by mouth every 4 (four) hours as needed for severe pain. 08/11/15   Milly Jakob, MD    Family  History History reviewed. No pertinent family history.  Social History Social History  Substance Use Topics  . Smoking status: Current Every Day Smoker    Packs/day: 0.50    Types: Cigarettes  . Smokeless tobacco: Never Used  . Alcohol use Yes     Comment: occ     Allergies   Cephalosporins; Fish-derived products; and Keflex [cephalexin]   Review of Systems Review of Systems  HENT: Positive for ear pain.   All other systems reviewed and are negative.    Physical Exam Updated Vital Signs BP 117/77   Pulse 86   Temp 97.8 F (36.6 C) (Oral)   Resp 16    SpO2 99%   Physical Exam  Constitutional: He is oriented to person, place, and time. He appears well-developed and well-nourished.  HENT:  Head: Normocephalic and atraumatic.  Mouth/Throat: Oropharynx is clear and moist.  Left EAC is erythematous, TM is also slightly erythematous but not bulging, no appreciable effusion, no signs of rupture Right ear normal  Eyes: Conjunctivae and EOM are normal. Pupils are equal, round, and reactive to light.  Neck: Normal range of motion.  Cardiovascular: Normal rate, regular rhythm and normal heart sounds.   Pulmonary/Chest: Effort normal and breath sounds normal.  Abdominal: Soft. Bowel sounds are normal.  Musculoskeletal: Normal range of motion.  Neurological: He is alert and oriented to person, place, and time.  Skin: Skin is warm and dry.  Psychiatric: He has a normal mood and affect.  Nursing note and vitals reviewed.    ED Treatments / Results  Labs (all labs ordered are listed, but only abnormal results are displayed) Labs Reviewed - No data to display  EKG  EKG Interpretation None       Radiology No results found.  Procedures Procedures (including critical care time)  Medications Ordered in ED Medications  hydrogen peroxide 3 % external solution (not administered)  ibuprofen (ADVIL,MOTRIN) tablet 800 mg (800 mg Oral Given 11/06/16 0523)  amoxicillin (AMOXIL) capsule 500 mg (500 mg Oral Given 11/06/16 0522)     Initial Impression / Assessment and Plan / ED Course  I have reviewed the triage vital signs and the nursing notes.  Pertinent labs & imaging results that were available during my care of the patient were reviewed by me and considered in my medical decision making (see chart for details).  46 year old male here with left ear pain.  EAC and TM appear slightly erythematous, no signs of rupture. No effusion. He is afebrile and nontoxic. Will start on amoxicillin for possible early otitis media. Follow-up with PCP.   Discussed plan with patient, he acknowledged understanding and agreed with plan of care.  Return precautions given for new or worsening symptoms.  Final Clinical Impressions(s) / ED Diagnoses   Final diagnoses:  Left ear pain    New Prescriptions New Prescriptions   AMOXICILLIN (AMOXIL) 500 MG CAPSULE    Take 1 capsule (500 mg total) by mouth 3 (three) times daily.     Larene Pickett, PA-C 11/06/16 2505    Varney Biles, MD 11/06/16 [phone]

## 2016-11-06 NOTE — Discharge Instructions (Signed)
Stop amoxicillin. List this as an allergy in the future. Take azithromycin instead. You may have some continued itching/rash for the rest of the day. You can continue to take benadryl 25-50 mg every 6 hours as needed for this. If you develop difficulty with your breathing, dizziness/lightheadedness or anything else concerning then you need to seek immediate evaluation. The epi-pens are to be used in these scenarios. If you feel you need to use an epi-pen then the next step is to call 911.

## 2017-10-17 ENCOUNTER — Emergency Department (HOSPITAL_COMMUNITY): Payer: Self-pay

## 2017-10-17 ENCOUNTER — Inpatient Hospital Stay (HOSPITAL_COMMUNITY)
Admission: EM | Admit: 2017-10-17 | Discharge: 2017-10-21 | DRG: 202 | Disposition: A | Discharge status: Home / Self Care | Payer: Self-pay | Attending: Family Medicine | Admitting: Family Medicine

## 2017-10-17 ENCOUNTER — Encounter (HOSPITAL_COMMUNITY): Payer: Self-pay

## 2017-10-17 DIAGNOSIS — J441 Chronic obstructive pulmonary disease with (acute) exacerbation: Secondary | ICD-10-CM | POA: Diagnosis present

## 2017-10-17 DIAGNOSIS — Z720 Tobacco use: Secondary | ICD-10-CM | POA: Diagnosis present

## 2017-10-17 DIAGNOSIS — R0789 Other chest pain: Secondary | ICD-10-CM | POA: Diagnosis present

## 2017-10-17 DIAGNOSIS — E876 Hypokalemia: Secondary | ICD-10-CM | POA: Diagnosis present

## 2017-10-17 DIAGNOSIS — D696 Thrombocytopenia, unspecified: Secondary | ICD-10-CM | POA: Diagnosis present

## 2017-10-17 DIAGNOSIS — J45901 Unspecified asthma with (acute) exacerbation: Secondary | ICD-10-CM

## 2017-10-17 DIAGNOSIS — K7689 Other specified diseases of liver: Secondary | ICD-10-CM | POA: Diagnosis present

## 2017-10-17 DIAGNOSIS — F1410 Cocaine abuse, uncomplicated: Secondary | ICD-10-CM | POA: Diagnosis present

## 2017-10-17 DIAGNOSIS — E538 Deficiency of other specified B group vitamins: Secondary | ICD-10-CM | POA: Diagnosis present

## 2017-10-17 DIAGNOSIS — I209 Angina pectoris, unspecified: Secondary | ICD-10-CM | POA: Diagnosis present

## 2017-10-17 DIAGNOSIS — J4541 Moderate persistent asthma with (acute) exacerbation: Principal | ICD-10-CM | POA: Diagnosis present

## 2017-10-17 DIAGNOSIS — F17210 Nicotine dependence, cigarettes, uncomplicated: Secondary | ICD-10-CM | POA: Diagnosis present

## 2017-10-17 HISTORY — DX: Thrombocytopenia, unspecified: D69.6

## 2017-10-17 HISTORY — DX: Tobacco use: Z72.0

## 2017-10-17 LAB — I-STAT TROPONIN, ED: Troponin i, poc: 0 ng/mL (ref 0.00–0.08)

## 2017-10-17 LAB — CBC
HCT: 44.1 % (ref 39.0–52.0)
HEMOGLOBIN: 15 g/dL (ref 13.0–17.0)
MCH: 31.1 pg (ref 26.0–34.0)
MCHC: 34 g/dL (ref 30.0–36.0)
MCV: 91.5 fL (ref 78.0–100.0)
Platelets: 29 10*3/uL — CL (ref 150–400)
RBC: 4.82 MIL/uL (ref 4.22–5.81)
RDW: 13.5 % (ref 11.5–15.5)
WBC: 9.3 10*3/uL (ref 4.0–10.5)

## 2017-10-17 LAB — BASIC METABOLIC PANEL
ANION GAP: 7 (ref 5–15)
BUN: 10 mg/dL (ref 6–20)
CALCIUM: 8.5 mg/dL — AB (ref 8.9–10.3)
CO2: 25 mmol/L (ref 22–32)
Chloride: 107 mmol/L (ref 101–111)
Creatinine, Ser: 1.18 mg/dL (ref 0.61–1.24)
Glucose, Bld: 128 mg/dL — ABNORMAL HIGH (ref 65–99)
Potassium: 4 mmol/L (ref 3.5–5.1)
Sodium: 139 mmol/L (ref 135–145)

## 2017-10-17 MED ORDER — ALBUTEROL SULFATE (2.5 MG/3ML) 0.083% IN NEBU
5.0000 mg | INHALATION_SOLUTION | Freq: Once | RESPIRATORY_TRACT | Status: AC
  Administered 2017-10-17: 5 mg via RESPIRATORY_TRACT
  Filled 2017-10-17: qty 6

## 2017-10-17 MED ORDER — METHYLPREDNISOLONE SODIUM SUCC 125 MG IJ SOLR
125.0000 mg | Freq: Once | INTRAMUSCULAR | Status: AC
  Administered 2017-10-17: 125 mg via INTRAVENOUS
  Filled 2017-10-17: qty 2

## 2017-10-17 MED ORDER — SODIUM CHLORIDE 0.9 % IV BOLUS
1000.0000 mL | Freq: Once | INTRAVENOUS | Status: AC
  Administered 2017-10-17: 1000 mL via INTRAVENOUS

## 2017-10-17 MED ORDER — ALBUTEROL (5 MG/ML) CONTINUOUS INHALATION SOLN
15.0000 mg/h | INHALATION_SOLUTION | Freq: Once | RESPIRATORY_TRACT | Status: AC
  Administered 2017-10-17: 15 mg/h via RESPIRATORY_TRACT
  Filled 2017-10-17: qty 20

## 2017-10-17 MED ORDER — IPRATROPIUM BROMIDE 0.02 % IN SOLN
0.5000 mg | Freq: Once | RESPIRATORY_TRACT | Status: AC
  Administered 2017-10-17: 0.5 mg via RESPIRATORY_TRACT
  Filled 2017-10-17: qty 2.5

## 2017-10-17 MED ORDER — MAGNESIUM SULFATE 2 GM/50ML IV SOLN
2.0000 g | Freq: Once | INTRAVENOUS | Status: AC
  Administered 2017-10-17: 2 g via INTRAVENOUS
  Filled 2017-10-17: qty 50

## 2017-10-17 NOTE — ED Triage Notes (Signed)
Pt reports that he has been having left sided chest pain that started last night 6/10 pinching sensation. Pt appears to have malaise and expiratory wheezes ( h/o of asthma)

## 2017-10-17 NOTE — ED Provider Notes (Signed)
[facility] DEPT Provider Note   CSN: [id number] Arrival date & time: 10/17/17  2045     History   Chief Complaint Chief Complaint  Patient presents with  . Shortness of Breath  . Chest Pain    HPI Anthony Finley is a 47 y.o. male.  Patient presents with a 2-day history of chest pain or shortness of breath.  Does have a history of asthma and continues to smoke.  States he had left-sided chest pain that is been constant since yesterday nothing makes it better nothing makes it worse.  Does not radiate.  It is associated with shortness of breath and productive cough of white mucus.  Associated with rhinorrhea and sore throat.  No leg pain or swelling.  He has not been admitted for asthma since he was a child.  Good p.o. intake and urine output.  No fever.  No nausea or vomiting.  The history is provided by the patient.  Shortness of Breath  Associated symptoms include rhinorrhea, cough and chest pain. Pertinent negatives include no fever, no headaches, no vomiting and no abdominal pain.  Chest Pain   Associated symptoms include cough and shortness of breath. Pertinent negatives include no abdominal pain, no dizziness, no fever, no headaches, no nausea, no vomiting and no weakness.    Past Medical History:  Diagnosis Date  . Asthma    mostly as child-uses inh about q 3-4 months  . No pertinent past medical history     There are no active problems to display for this patient.   Past Surgical History:  Procedure Laterality Date  . FRACTURE SURGERY     orif fx lt ankle age 50  . HAND SURGERY Left 08/2015  . ORIF ANKLE FRACTURE  02/22/2012   Procedure: OPEN REDUCTION INTERNAL FIXATION (ORIF) ANKLE FRACTURE;  Surgeon: Wylene Simmer, MD;  Location: [facility];  Service: Orthopedics;  Laterality: Left;  Open Reduction Internal Fixation Left Ankle Bimalleolar Fracture;   Open Reduction Internal Fixation of Syndesmosis, and Repair of  Deltoid Ligament  . PERCUTANEOUS PINNING Left 08/11/2015   Procedure: PINNING OF LEFT 4TH METACARPAL FRACTURE;  Surgeon: Milly Jakob, MD;  Location: [facility];  Service: Orthopedics;  Laterality: Left;        Home Medications    Prior to Admission medications   Medication Sig Start Date End Date Taking? Authorizing Provider  albuterol (PROVENTIL HFA;VENTOLIN HFA) 108 (90 Base) MCG/ACT inhaler Inhale into the lungs every 6 (six) hours as needed for wheezing or shortness of breath.    [provider]  azithromycin (ZITHROMAX) 250 MG tablet Take 1 tablet (250 mg total) by mouth daily. Take first 2 tablets together, then 1 every day until finished. 11/06/16   Virgel Manifold, MD  diphenhydrAMINE (BENADRYL) 25 MG tablet Take 25-50 mg by mouth every 6 (six) hours as needed for allergies or sleep.     [provider]  doxycycline (VIBRAMYCIN) 100 MG capsule Take 1 capsule (100 mg total) by mouth 2 (two) times daily. One po bid x 7 days Patient not taking: Reported on 11/06/2016 12/07/15   Street, [place], PA-C  EPINEPHrine 0.3 mg/0.3 mL IJ SOAJ injection Inject 0.3 mLs (0.3 mg total) into the muscle once as needed (anaphylaxis). 11/06/16   Virgel Manifold, MD  fluticasone (CUTIVATE) 0.05 % cream Apply topically 2 (two) times daily. Patient not taking: Reported on 11/06/2016 10/17/15   Billy Fischer, MD  HYDROcodone-acetaminophen [facility]) 5-325 MG tablet  Take 1 tablet by mouth every 6 (six) hours as needed for moderate pain. Patient not taking: Reported on 11/06/2016 08/07/15   [name], PA-C  HYDROcodone-acetaminophen (NORCO) 5-325 MG tablet Take 1 tablet by mouth every 6 (six) hours as needed for severe pain. Patient not taking: Reported on 11/06/2016 12/07/15   Street, [place], PA-C  ibuprofen (ADVIL,MOTRIN) 200 MG tablet Take 200 mg by mouth every 6 (six) hours as needed for mild pain or moderate pain.    [provider]  metroNIDAZOLE (FLAGYL) 500 MG tablet  Take 1 tablet (500 mg total) by mouth 3 (three) times daily. Patient not taking: Reported on 11/06/2016 08/07/15   [name], PA-C  naproxen (NAPROSYN) 500 MG tablet Take 1 tablet (500 mg total) by mouth 2 (two) times daily. Patient not taking: Reported on 11/06/2016 08/07/15   [name], PA-C  naproxen (NAPROSYN) 500 MG tablet Take 1 tablet (500 mg total) by mouth 2 (two) times daily as needed for mild pain, moderate pain or headache (TAKE WITH MEALS.). Patient not taking: Reported on 11/06/2016 12/07/15   Street, [place], PA-C  naproxen sodium (ANAPROX) 220 MG tablet Take 220 mg by mouth daily as needed (pain).    [provider]  oxyCODONE-acetaminophen (ROXICET) 5-325 MG tablet Take 1-2 tablets by mouth every 4 (four) hours as needed for severe pain. Patient not taking: Reported on 11/06/2016 08/11/15   Milly Jakob, MD    Family History History reviewed. No pertinent family history.  Social History Social History   Tobacco Use  . Smoking status: Current Every Day Smoker    Packs/day: 0.50    Types: Cigarettes  . Smokeless tobacco: Never Used  Substance Use Topics  . Alcohol use: Yes    Comment: occ  . Drug use: No     Allergies   Cephalosporins; Fish-derived products; Keflex [cephalexin]; and Penicillins   Review of Systems Review of Systems  Constitutional: Negative for activity change, appetite change, fatigue and fever.  HENT: Positive for congestion and rhinorrhea.   Respiratory: Positive for cough, chest tightness and shortness of breath.   Cardiovascular: Positive for chest pain.  Gastrointestinal: Negative for abdominal pain, nausea and vomiting.  Genitourinary: Negative for dysuria, hematuria and testicular pain.  Musculoskeletal: Negative for arthralgias and myalgias.  Neurological: Negative for dizziness, weakness and headaches.   all other systems are negative except as noted in the HPI and PMH.     Physical Exam Updated Vital  Signs BP 133/88 (BP Location: Left Arm)   Pulse (!) 112   Temp 99 F (37.2 C) (Oral)   Resp (!) 22   Ht 5' 11.5" (1.816 m)   Wt 103.4 kg (228 lb)   SpO2 96%   BMI 31.36 kg/m   Physical Exam  Constitutional: He is oriented to person, place, and time. He appears well-developed and well-nourished. No distress.  Mild respiratory distress  HENT:  Head: Normocephalic and atraumatic.  Mouth/Throat: Oropharynx is clear and moist. No oropharyngeal exudate.  Eyes: Pupils are equal, round, and reactive to light. Conjunctivae and EOM are normal.  Neck: Normal range of motion. Neck supple.  No meningismus.  Cardiovascular: Normal rate, regular rhythm, normal heart sounds and intact distal pulses.  No murmur heard. Pulmonary/Chest: He is in respiratory distress. He has wheezes. He exhibits tenderness.  Diffuse inspiratory and expiratory wheezing TTP L sternum  Abdominal: Soft. There is no tenderness. There is no rebound and no guarding.  Musculoskeletal: Normal range of motion. He exhibits no  edema or tenderness.  Neurological: He is alert and oriented to person, place, and time. No cranial nerve deficit. He exhibits normal muscle tone. Coordination normal.  No ataxia on finger to nose bilaterally. No pronator drift. 5/5 strength throughout. CN 2-12 intact.Equal grip strength. Sensation intact.   Skin: Skin is warm. Capillary refill takes less than 2 seconds. No pallor.  Psychiatric: He has a normal mood and affect. His behavior is normal.  Nursing note and vitals reviewed.    ED Treatments / Results  Labs (all labs ordered are listed, but only abnormal results are displayed) Labs Reviewed  BASIC METABOLIC PANEL - Abnormal; Notable for the following components:      Result Value   Glucose, Bld 128 (*)    Calcium 8.5 (*)    All other components within normal limits  CBC - Abnormal; Notable for the following components:   Platelets 29 (*)    All other components within normal limits   D-DIMER, QUANTITATIVE (NOT AT [facility])  BRAIN NATRIURETIC PEPTIDE  HIV ANTIBODY (ROUTINE TESTING)  TROPONIN I  TROPONIN I  TROPONIN I  CBC  RAPID URINE DRUG SCREEN, HOSP PERFORMED  LIPID PANEL  COMPREHENSIVE METABOLIC PANEL  I-STAT TROPONIN, ED    EKG EKG Interpretation  Date/Time:  Monday October 17 2017 20:57:06 EDT Ventricular Rate:  104 PR Interval:  128 QRS Duration: 66 QT Interval:  320 QTC Calculation: 420 R Axis:   71 Text Interpretation:  Sinus tachycardia Otherwise normal ECG Rate faster Confirmed by Ezequiel Essex [phone]) on 10/17/2017 11:18:03 PM   Radiology Dg Chest 2 View  Result Date: 10/17/2017 CLINICAL DATA:  LEFT chest pain since last night, wheezing. History of asthma. EXAM: CHEST - 2 VIEW COMPARISON:  Chest radiograph April 17, 2013 FINDINGS: Cardiomediastinal silhouette is normal. LEFT greater than RIGHT apical bullous changes and increased. Mild bronchitic changes. Increased lung volumes with flattened hemidiaphragms. No pleural effusion or focal consolidation. No pneumothorax. Soft tissue planes and included osseous structures are non suspicious. IMPRESSION: Worsening COPD. Electronically Signed   By: Elon Alas M.D.   On: 10/17/2017 22:37    Procedures Procedures (including critical care time)  Medications Ordered in ED Medications  albuterol (PROVENTIL,VENTOLIN) solution continuous neb (has no administration in time range)  ipratropium (ATROVENT) nebulizer solution 0.5 mg (has no administration in time range)  magnesium sulfate IVPB 2 g 50 mL (has no administration in time range)  methylPREDNISolone sodium succinate (SOLU-MEDROL) 125 mg/2 mL injection 125 mg (has no administration in time range)  albuterol (PROVENTIL) (2.5 MG/3ML) 0.083% nebulizer solution 5 mg (5 mg Nebulization Given 10/17/17 2157)     Initial Impression / Assessment and Plan / ED Course  I have reviewed the triage vital signs and the nursing notes.  Pertinent labs &  imaging results that were available during my care of the patient were reviewed by me and considered in my medical decision making (see chart for details).    Shortness of breath and chest pain since yesterday. Wheezing on exam. EKG nonischemic. CP is reproducible and worse with palpation. Chest pain seems atypical for ACS.  Troponin is negative and d-dimer is negative.  Thrombocytopenia on labs. No comparison.  Patient states he was told he had low platelets while in the study several years ago but never had any follow-up regarding this.  He denies any issues with bleeding. Nebulizers, steroids, magnesium. CXR without infiltrate.  Patient remains very wheezy after continuous nebulizer, steroids and magnesium.  He is able to  ambulate without hypoxia but becomes very tachypneic and tachycardic.  He still has continuous wheezing and increased work of breathing.  Plan admission for COPD exacerbation. Admission discussed with Dr. Tamala Julian.  Patient aware of thrombocytopenia with no recent bleeding issues.  CRITICAL CARE Performed by: Ezequiel Essex Total critical care time: 45 minutes Critical care time was exclusive of separately billable procedures and treating other patients. Critical care was necessary to treat or prevent imminent or life-threatening deterioration. Critical care was time spent personally by me on the following activities: development of treatment plan with patient and/or surrogate as well as nursing, discussions with consultants, evaluation of patient's response to treatment, examination of patient, obtaining history from patient or surrogate, ordering and performing treatments and interventions, ordering and review of laboratory studies, ordering and review of radiographic studies, pulse oximetry and re-evaluation of patient's condition.   Final Clinical Impressions(s) / ED Diagnoses   Final diagnoses:  COPD exacerbation ([place])  Thrombocytopenia [facility])    ED Discharge  Orders    None       Ezequiel Essex, MD 10/18/17 [phone]

## 2017-10-17 NOTE — ED Notes (Signed)
Pt o2 saturation is 89%

## 2017-10-17 NOTE — ED Notes (Addendum)
Date and time results received: 10/17/17 2336Test: Platelets Critical Value: 29k  Name of Provider Notified: Dr. Wyvonnia Dusky  Orders Received? Or Actions Taken?:

## 2017-10-17 NOTE — ED Notes (Signed)
PT adm neb treatment.

## 2017-10-18 ENCOUNTER — Other Ambulatory Visit: Payer: Self-pay

## 2017-10-18 ENCOUNTER — Inpatient Hospital Stay (HOSPITAL_COMMUNITY): Payer: Self-pay

## 2017-10-18 ENCOUNTER — Encounter (HOSPITAL_COMMUNITY): Payer: Self-pay | Admitting: Internal Medicine

## 2017-10-18 DIAGNOSIS — I209 Angina pectoris, unspecified: Secondary | ICD-10-CM | POA: Diagnosis present

## 2017-10-18 DIAGNOSIS — D696 Thrombocytopenia, unspecified: Secondary | ICD-10-CM

## 2017-10-18 DIAGNOSIS — Z720 Tobacco use: Secondary | ICD-10-CM | POA: Diagnosis present

## 2017-10-18 DIAGNOSIS — J45901 Unspecified asthma with (acute) exacerbation: Secondary | ICD-10-CM

## 2017-10-18 DIAGNOSIS — J441 Chronic obstructive pulmonary disease with (acute) exacerbation: Secondary | ICD-10-CM | POA: Diagnosis present

## 2017-10-18 LAB — HIV ANTIBODY (ROUTINE TESTING W REFLEX): HIV Screen 4th Generation wRfx: NONREACTIVE

## 2017-10-18 LAB — PROTIME-INR
INR: 0.96
Prothrombin Time: 12.7 seconds (ref 11.4–15.2)

## 2017-10-18 LAB — COMPREHENSIVE METABOLIC PANEL
ALK PHOS: 42 U/L (ref 38–126)
ALT: 31 U/L (ref 17–63)
ANION GAP: 7 (ref 5–15)
AST: 29 U/L (ref 15–41)
Albumin: 2.8 g/dL — ABNORMAL LOW (ref 3.5–5.0)
BILIRUBIN TOTAL: 0.2 mg/dL — AB (ref 0.3–1.2)
BUN: 11 mg/dL (ref 6–20)
CALCIUM: 8.3 mg/dL — AB (ref 8.9–10.3)
CO2: 25 mmol/L (ref 22–32)
Chloride: 108 mmol/L (ref 101–111)
Creatinine, Ser: 1.18 mg/dL (ref 0.61–1.24)
Glucose, Bld: 162 mg/dL — ABNORMAL HIGH (ref 65–99)
Potassium: 3.4 mmol/L — ABNORMAL LOW (ref 3.5–5.1)
Sodium: 140 mmol/L (ref 135–145)
TOTAL PROTEIN: 4.9 g/dL — AB (ref 6.5–8.1)

## 2017-10-18 LAB — RAPID URINE DRUG SCREEN, HOSP PERFORMED
Amphetamines: NOT DETECTED
BENZODIAZEPINES: NOT DETECTED
Cocaine: POSITIVE — AB
OPIATES: NOT DETECTED
Tetrahydrocannabinol: NOT DETECTED

## 2017-10-18 LAB — D-DIMER, QUANTITATIVE: D-Dimer, Quant: <0.27 ug/mL-FEU (ref 0.00–0.50)

## 2017-10-18 LAB — LIPID PANEL
Cholesterol: 134 mg/dL (ref 0–200)
HDL: 53 mg/dL (ref >40)
LDL Cholesterol: 47 mg/dL (ref 0–99)
TRIGLYCERIDES: 170 mg/dL — AB (ref <150)
Total CHOL/HDL Ratio: 2.5 RATIO
VLDL: 34 mg/dL (ref 0–40)

## 2017-10-18 LAB — CBC
HCT: 39.2 % (ref 39.0–52.0)
Hemoglobin: 13.2 g/dL (ref 13.0–17.0)
MCH: 30.7 pg (ref 26.0–34.0)
MCHC: 33.7 g/dL (ref 30.0–36.0)
MCV: 91.2 fL (ref 78.0–100.0)
PLATELETS: 29 10*3/uL — AB (ref 150–400)
RBC: 4.3 MIL/uL (ref 4.22–5.81)
RDW: 13.8 % (ref 11.5–15.5)
WBC: 7.7 10*3/uL (ref 4.0–10.5)

## 2017-10-18 LAB — BRAIN NATRIURETIC PEPTIDE: B NATRIURETIC PEPTIDE 5: 30.9 pg/mL (ref 0.0–100.0)

## 2017-10-18 LAB — TROPONIN I: Troponin I: <0.03 ng/mL (ref <0.03)

## 2017-10-18 LAB — VITAMIN B12: Vitamin B-12: 171 pg/mL — ABNORMAL LOW (ref 180–914)

## 2017-10-18 LAB — APTT: APTT: 28 s (ref 24–36)

## 2017-10-18 LAB — TSH: TSH: 1.691 u[IU]/mL (ref 0.350–4.500)

## 2017-10-18 MED ORDER — IPRATROPIUM-ALBUTEROL 0.5-2.5 (3) MG/3ML IN SOLN
3.0000 mL | Freq: Four times a day (QID) | RESPIRATORY_TRACT | Status: DC
  Administered 2017-10-18 – 2017-10-19 (×8): 3 mL via RESPIRATORY_TRACT
  Filled 2017-10-18 (×8): qty 3

## 2017-10-18 MED ORDER — GUAIFENESIN ER 600 MG PO TB12
600.0000 mg | ORAL_TABLET | Freq: Two times a day (BID) | ORAL | Status: DC
  Administered 2017-10-18 – 2017-10-21 (×8): 600 mg via ORAL
  Filled 2017-10-18 (×8): qty 1

## 2017-10-18 MED ORDER — IPRATROPIUM-ALBUTEROL 0.5-2.5 (3) MG/3ML IN SOLN: 3.0000 mL | RESPIRATORY_TRACT | Status: DC | PRN

## 2017-10-18 MED ORDER — VITAMIN B-12 1000 MCG PO TABS
1000.0000 ug | ORAL_TABLET | Freq: Every day | ORAL | Status: DC
Start: 2017-10-18 — End: 2017-10-21
  Administered 2017-10-18 – 2017-10-21 (×4): 1000 ug via ORAL
  Filled 2017-10-18 (×4): qty 1

## 2017-10-18 MED ORDER — FOLIC ACID 1 MG PO TABS
1.0000 mg | ORAL_TABLET | Freq: Every day | ORAL | Status: DC
  Administered 2017-10-18 – 2017-10-21 (×4): 1 mg via ORAL
  Filled 2017-10-18 (×4): qty 1

## 2017-10-18 MED ORDER — VITAMIN B-1 100 MG PO TABS
100.0000 mg | ORAL_TABLET | Freq: Every day | ORAL | Status: DC
  Administered 2017-10-18 – 2017-10-21 (×4): 100 mg via ORAL
  Filled 2017-10-18 (×4): qty 1

## 2017-10-18 MED ORDER — ONDANSETRON HCL 4 MG PO TABS: 4.0000 mg | ORAL_TABLET | Freq: Four times a day (QID) | ORAL | Status: DC | PRN

## 2017-10-18 MED ORDER — ACETAMINOPHEN 650 MG RE SUPP
650.0000 mg | Freq: Four times a day (QID) | RECTAL | Status: DC | PRN
Start: 2017-10-18 — End: 2017-10-21

## 2017-10-18 MED ORDER — POTASSIUM CHLORIDE CRYS ER 20 MEQ PO TBCR
20.0000 meq | EXTENDED_RELEASE_TABLET | Freq: Once | ORAL | Status: AC
  Administered 2017-10-18: 20 meq via ORAL
  Filled 2017-10-18: qty 1

## 2017-10-18 MED ORDER — IPRATROPIUM-ALBUTEROL 0.5-2.5 (3) MG/3ML IN SOLN
3.0000 mL | Freq: Once | RESPIRATORY_TRACT | Status: AC
  Administered 2017-10-18: 3 mL via RESPIRATORY_TRACT
  Filled 2017-10-18: qty 3

## 2017-10-18 MED ORDER — ACETAMINOPHEN 325 MG PO TABS
650.0000 mg | ORAL_TABLET | Freq: Four times a day (QID) | ORAL | Status: DC | PRN
  Administered 2017-10-18 – 2017-10-20 (×4): 650 mg via ORAL
  Filled 2017-10-18 (×4): qty 2

## 2017-10-18 MED ORDER — NICOTINE 21 MG/24HR TD PT24
21.0000 mg | MEDICATED_PATCH | Freq: Every day | TRANSDERMAL | Status: DC
  Administered 2017-10-18 – 2017-10-21 (×4): 21 mg via TRANSDERMAL
  Filled 2017-10-18 (×4): qty 1

## 2017-10-18 MED ORDER — DOXYCYCLINE HYCLATE 100 MG PO TABS
100.0000 mg | ORAL_TABLET | Freq: Once | ORAL | Status: AC
  Administered 2017-10-18: 100 mg via ORAL
  Filled 2017-10-18: qty 1

## 2017-10-18 MED ORDER — ALPRAZOLAM 0.5 MG PO TABS
0.5000 mg | ORAL_TABLET | Freq: Two times a day (BID) | ORAL | Status: DC | PRN
  Administered 2017-10-18 – 2017-10-20 (×3): 0.5 mg via ORAL
  Filled 2017-10-18 (×4): qty 1

## 2017-10-18 MED ORDER — BUDESONIDE 0.25 MG/2ML IN SUSP
0.2500 mg | Freq: Two times a day (BID) | RESPIRATORY_TRACT | Status: DC
  Administered 2017-10-18 – 2017-10-21 (×7): 0.25 mg via RESPIRATORY_TRACT
  Filled 2017-10-18 (×8): qty 2

## 2017-10-18 MED ORDER — METHYLPREDNISOLONE SODIUM SUCC 125 MG IJ SOLR
60.0000 mg | Freq: Three times a day (TID) | INTRAMUSCULAR | Status: DC
  Administered 2017-10-18 – 2017-10-21 (×10): 60 mg via INTRAVENOUS
  Filled 2017-10-18 (×11): qty 2

## 2017-10-18 MED ORDER — DOXYCYCLINE HYCLATE 100 MG PO TABS
100.0000 mg | ORAL_TABLET | Freq: Two times a day (BID) | ORAL | Status: DC
  Administered 2017-10-18 – 2017-10-21 (×7): 100 mg via ORAL
  Filled 2017-10-18 (×7): qty 1

## 2017-10-18 MED ORDER — ONDANSETRON HCL 4 MG/2ML IJ SOLN: 4.0000 mg | Freq: Four times a day (QID) | INTRAMUSCULAR | Status: DC | PRN

## 2017-10-18 NOTE — ED Notes (Signed)
ED TO INPATIENT HANDOFF REPORT  Name/Age/Gender Anthony Finley 47 y.o. male  Code Status    Code Status Orders  (From admission, onward)        Start     Ordered   10/18/17 0256  Full code  Continuous     10/18/17 0302    Code Status History    This patient has a current code status but no historical code status.      Home/SNF/Other Home  Chief Complaint Shortness of Breath;Chest Pain  Level of Care/Admitting Diagnosis ED Disposition    ED Disposition Condition Comment   Admit  Hospital Area: [place] [facility] [[id number]]  Level of Care: Telemetry [5]  Admit to tele based on following criteria: Monitor for Ischemic changes  Diagnosis: COPD exacerbation [facility]) [[id number]]  Admitting Physician: Norval Morton [[id number]]  Attending Physician: Norval Morton [[id number]]  Estimated length of stay: past midnight tomorrow  Certification:: I certify this patient will need inpatient services for at least 2 midnights  PT Class (Do Not Modify): Inpatient [101]  PT Acc Code (Do Not Modify): Private [1]       Medical History Past Medical History:  Diagnosis Date  . Asthma    mostly as child-uses inh about q 3-4 months  . Thrombocytopenia ([place])   . Tobacco abuse     Allergies Allergies  Allergen Reactions  . Cephalosporins     Hives/ Swelling   . Fish-Derived Products     rash  . Keflex [Cephalexin]     unknown  . Penicillins Hives    Has patient had a PCN reaction causing immediate rash, facial/tongue/throat swelling, SOB or lightheadedness with hypotension: No Has patient had a PCN reaction causing severe rash involving mucus membranes or skin necrosis: No Has patient had a PCN reaction that required hospitalization: Yes Has patient had a PCN reaction occurring within the last 10 years: Yes If all of the above answers are "NO", then may proceed with Cephalosporin use.     IV Location/Drains/Wounds Patient Lines/Drains/Airways Status   Active  Line/Drains/Airways    Name:   Placement date:   Placement time:   Site:   Days:   Peripheral IV 10/17/17 Right Forearm   10/17/17    2350    Forearm   1   Incision 02/22/12 Ankle Left   02/22/12    1327     2065   Incision (Closed) 08/11/15 Arm Left   08/11/15    1239     799          Labs/Imaging Results for orders placed or performed during the hospital encounter of 10/17/17 (from the past 48 hour(s))  Basic metabolic panel     Status: Abnormal   Collection Time: 10/17/17 10:09 PM  Result Value Ref Range   Sodium 139 135 - 145 mmol/L   Potassium 4.0 3.5 - 5.1 mmol/L   Chloride 107 101 - 111 mmol/L   CO2 25 22 - 32 mmol/L   Glucose, Bld 128 (H) 65 - 99 mg/dL   BUN 10 6 - 20 mg/dL   Creatinine, Ser 1.18 0.61 - 1.24 mg/dL   Calcium 8.5 (L) 8.9 - 10.3 mg/dL   GFR calc non Af Amer >60 >60 mL/min   GFR calc Af Amer >60 >60 mL/min    Comment: (NOTE) The eGFR has been calculated using the CKD EPI equation. This calculation has not been validated in all clinical situations. eGFR's persistently <60 mL/min signify possible  Chronic Kidney Disease.    Anion gap 7 5 - 15    Comment: Performed at [facility], [place] [address] [place], [place] [address]  CBC     Status: Abnormal   Collection Time: 10/17/17 10:09 PM  Result Value Ref Range   WBC 9.3 4.0 - 10.5 K/uL   RBC 4.82 4.22 - 5.81 MIL/uL   Hemoglobin 15.0 13.0 - 17.0 g/dL   HCT 44.1 39.0 - 52.0 %   MCV 91.5 78.0 - 100.0 fL   MCH 31.1 26.0 - 34.0 pg   MCHC 34.0 30.0 - 36.0 g/dL   RDW 13.5 11.5 - 15.5 %   Platelets 29 (LL) 150 - 400 K/uL    Comment: REPEATED TO VERIFY SPECIMEN CHECKED FOR CLOTS PLATELET COUNT CONFIRMED BY SMEAR CRITICAL RESULT CALLED TO, READ BACK BY AND VERIFIED WITH: DOSTER,T RN AT 1135 10/17/17 BY TIBBITTS,K Performed at [facility], [place] [address], [place], [place] [address]   D-dimer, quantitative (not at [facility])     Status: None   Collection Time: 10/17/17 10:09  PM  Result Value Ref Range   D-Dimer, Quant <0.27 0.00 - 0.50 ug/mL-FEU    Comment: (NOTE) At the manufacturer cut-off of 0.50 ug/mL FEU, this assay has been documented to exclude PE with a sensitivity and negative predictive value of 97 to 99%.  At this time, this assay has not been approved by the FDA to exclude DVT/VTE. Results should be correlated with clinical presentation. Performed at [facility], [place] [address] [place], [place] [address]   Brain natriuretic peptide     Status: None   Collection Time: 10/17/17 10:09 PM  Result Value Ref Range   B Natriuretic Peptide 30.9 0.0 - 100.0 pg/mL    Comment: Performed at [facility], [place] [address] [place], [place] [address]  I-stat troponin, ED     Status: None   Collection Time: 10/17/17 10:15 PM  Result Value Ref Range   Troponin i, poc 0.00 0.00 - 0.08 ng/mL   Comment 3            Comment: Due to the release kinetics of cTnI, a negative result within the first hours of the onset of symptoms does not rule out myocardial infarction with certainty. If myocardial infarction is still suspected, repeat the test at appropriate intervals.    Dg Chest 2 View  Result Date: 10/17/2017 CLINICAL DATA:  LEFT chest pain since last night, wheezing. History of asthma. EXAM: CHEST - 2 VIEW COMPARISON:  Chest radiograph April 17, 2013 FINDINGS: Cardiomediastinal silhouette is normal. LEFT greater than RIGHT apical bullous changes and increased. Mild bronchitic changes. Increased lung volumes with flattened hemidiaphragms. No pleural effusion or focal consolidation. No pneumothorax. Soft tissue planes and included osseous structures are non suspicious. IMPRESSION: Worsening COPD. Electronically Signed   By: Elon Alas M.D.   On: 10/17/2017 22:37    Pending Labs Unresulted Labs (From admission, onward)   Start     Ordered   10/18/17 0500  CBC  Tomorrow morning,   R     10/18/17 0302   10/18/17  0500  Lipid panel  Tomorrow morning,   R     10/18/17 0302   10/18/17 0500  Comprehensive metabolic panel  Tomorrow morning,   R     10/18/17 0336   10/18/17 0257  Urine rapid drug screen (hosp performed)  STAT,   R     10/18/17 0302  10/18/17 0256  Troponin I  Now then every 6 hours,   R     10/18/17 0302   10/18/17 0255  HIV antibody (Routine Testing)  Once,   R     10/18/17 0302      Vitals/Pain Today's Vitals   10/18/17 0145 10/18/17 0159 10/18/17 0300 10/18/17 0302  BP:   114/68   Pulse: (!) 109 (!) 130 (!) 119   Resp: (!) 29  (!) 24   Temp:    98.3 F (36.8 C)  TempSrc:    Oral  SpO2: 95% 97% 97%   Weight:      Height:        Isolation Precautions No active isolations  Medications Medications  ondansetron (ZOFRAN) tablet 4 mg (has no administration in time range)    Or  ondansetron (ZOFRAN) injection 4 mg (has no administration in time range)  acetaminophen (TYLENOL) tablet 650 mg (has no administration in time range)    Or  acetaminophen (TYLENOL) suppository 650 mg (has no administration in time range)  ipratropium-albuterol (DUONEB) 0.5-2.5 (3) MG/3ML nebulizer solution 3 mL (has no administration in time range)  ipratropium-albuterol (DUONEB) 0.5-2.5 (3) MG/3ML nebulizer solution 3 mL (has no administration in time range)  guaiFENesin (MUCINEX) 12 hr tablet 600 mg (has no administration in time range)  doxycycline (VIBRA-TABS) tablet 100 mg (has no administration in time range)  methylPREDNISolone sodium succinate (SOLU-MEDROL) 125 mg/2 mL injection 60 mg (has no administration in time range)  nicotine (NICODERM CQ - dosed in mg/24 hours) patch 21 mg (has no administration in time range)  albuterol (PROVENTIL) (2.5 MG/3ML) 0.083% nebulizer solution 5 mg (5 mg Nebulization Given 10/17/17 2157)  albuterol (PROVENTIL,VENTOLIN) solution continuous neb (15 mg/hr Nebulization Given 10/17/17 2350)  ipratropium (ATROVENT) nebulizer solution 0.5 mg (0.5 mg Nebulization  Given 10/17/17 2350)  magnesium sulfate IVPB 2 g 50 mL (0 g Intravenous Stopped 10/18/17 0051)  methylPREDNISolone sodium succinate (SOLU-MEDROL) 125 mg/2 mL injection 125 mg (125 mg Intravenous Given 10/17/17 2350)  sodium chloride 0.9 % bolus 1,000 mL (0 mLs Intravenous Stopped 10/18/17 0059)  ipratropium-albuterol (DUONEB) 0.5-2.5 (3) MG/3ML nebulizer solution 3 mL (3 mLs Nebulization Given 10/18/17 0228)  doxycycline (VIBRA-TABS) tablet 100 mg (100 mg Oral Given 10/18/17 0228)    Mobility walks

## 2017-10-18 NOTE — Progress Notes (Signed)
[facility] PROGRESS NOTE  Anthony Finley YYQ:[id number] DOB: [birth year] DOA: 10/17/2017  PCP: Marliss Coots, NP  Brief History/Interval Summary: 47 year old African-American male with a past medical history of asthma, tobacco abuse, cocaine abuse presented with shortness of breath and chest pain since Sunday night.  Patient was found to be wheezing.  He was thought to have acute asthma exacerbation.  He was hospitalized for further management.  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  Reason for Visit: Acute asthma exacerbation  Consultants: None  Procedures: None  Antibiotics: Doxycycline  Subjective/Interval History: Patient continues to have difficulty breathing.  Has not experienced much improvement compared to last night.  Denies any bleeding episodes.  No reddish spots on his skin.  No nosebleeds.  ROS: Denies Nausea or vomiting  Objective:  Vital Signs  Vitals:   10/18/17 0440 10/18/17 0456 10/18/17 0456 10/18/17 0846  BP: 106/77 110/74 110/74   Pulse: 100 98 (!) 101   Resp: (!) 22 20 20    Temp: 98.9 F (37.2 C) 99.4 F (37.4 C) 99.4 F (37.4 C)   TempSrc: Oral Oral Oral   SpO2: 94% 97% 97% 97%  Weight:      Height:        Intake/Output Summary (Last 24 hours) at 10/18/2017 1043 Last data filed at 10/18/2017 0700 Gross per 24 hour  Intake 240 ml  Output 350 ml  Net -110 ml   Filed Weights   10/17/17 2058  Weight: 103.4 kg (228 lb)    General appearance: alert, cooperative, appears stated age and no distress Head: Normocephalic, without obvious abnormality, atraumatic Resp: Mildly tachypneic at rest.  Continues to have wheezing bilateral lungs.  Few crackles at the bases.  No rhonchi. Cardio: regular rate and rhythm, S1, S2 normal, no murmur, click, rub or gallop GI: soft, non-tender; bowel sounds normal; no masses,  no organomegaly Extremities: extremities normal, atraumatic, no  cyanosis or edema Skin: No purpura noted Neurologic: Alert and oriented x3.  Cranial nerves II through XII intact.  Motor strength equal bilateral upper and lower extremities.  Lab Results:  Data Reviewed: I have personally reviewed following labs and imaging studies  CBC: Recent Labs  Lab 10/17/17 2209 10/18/17 0508  WBC 9.3 7.7  HGB 15.0 13.2  HCT 44.1 39.2  MCV 91.5 91.2  PLT 29* 29*    Basic Metabolic Panel: Recent Labs  Lab 10/17/17 2209 10/18/17 0508  NA 139 140  K 4.0 3.4*  CL 107 108  CO2 25 25  GLUCOSE 128* 162*  BUN 10 11  CREATININE 1.18 1.18  CALCIUM 8.5* 8.3*    GFR: Estimated Creatinine Clearance: 95.6 mL/min (by C-G formula based on SCr of 1.18 mg/dL).  Liver Function Tests: Recent Labs  Lab 10/18/17 0508  AST 29  ALT 31  ALKPHOS 42  BILITOT 0.2*  PROT 4.9*  ALBUMIN 2.8*    Coagulation Profile: Recent Labs  Lab 10/18/17 0902  INR 0.96    Cardiac Enzymes: Recent Labs  Lab 10/18/17 0508 10/18/17 0902  TROPONINI <0.03 <0.03    Lipid Profile: Recent Labs    10/18/17 0508  CHOL 134  HDL 53  LDLCALC 47  TRIG 170*  CHOLHDL 2.5    Thyroid Function Tests: Recent Labs    10/18/17 0902  TSH 1.691    Anemia Panel: Recent Labs    10/18/17 0902  VITAMINB12 171*    Radiology Studies: Dg Chest  2 View  Result Date: 10/17/2017 CLINICAL DATA:  LEFT chest pain since last night, wheezing. History of asthma. EXAM: CHEST - 2 VIEW COMPARISON:  Chest radiograph April 17, 2013 FINDINGS: Cardiomediastinal silhouette is normal. LEFT greater than RIGHT apical bullous changes and increased. Mild bronchitic changes. Increased lung volumes with flattened hemidiaphragms. No pleural effusion or focal consolidation. No pneumothorax. Soft tissue planes and included osseous structures are non suspicious. IMPRESSION: Worsening COPD. Electronically Signed   By: Elon Alas M.D.   On: 10/17/2017 22:37     Medications:  Scheduled: .  doxycycline  100 mg Oral Q12H  . guaiFENesin  600 mg Oral BID  . ipratropium-albuterol  3 mL Nebulization QID  . methylPREDNISolone (SOLU-MEDROL) injection  60 mg Intravenous Q8H  . nicotine  21 mg Transdermal Daily   Continuous:  TSV:[id number] **OR** acetaminophen, ALPRAZolam, ipratropium-albuterol, ondansetron **OR** ondansetron (ZOFRAN) IV  Assessment/Plan:    Acute asthma exacerbation with likely a component of COPD as well Patient with history of childhood asthma.  Use any maintenance medications.  However he also smokes quite heavily.  Likely has a mixed picture.  Continue with nebulizer treatment steroids and doxycycline.  Initiate budesonide as well.  Chest pain Atypical.  Normal d-dimer.  Troponins are normal.  Was present with palpation.  Likely related to the above.  Not show any ischemic changes.  Chest x-ray unremarkable as well.  Continue to watch for now.  Thrombocytopenia/B12 deficiency According the patient he was told that he has low platelet counts about 4 years ago.  Due to lack of insurance he has never followed up with any doctor.  Has not had any blood work done in the last 4 years.  He does drink alcohol but not on a daily basis.  He admits to using cocaine 5 days ago.  He denies any history of skin lesions suggestive of purpura or petechia.  Does not have any splenomegaly or hepatomegaly on examination.  We will do some preliminary work-up in the hospital.  We will obtain ultrasound of the abdomen.  B12 Level was checked and is noted to be low.  Folic acid level is pending.  We will start him on B12 supplementation as well as folic acid.  This could be the reason for his low platelets.  TSH is normal.  PT/INR PTT is normal.  HIV is pending.  Hepatitis panel is pending. He will benefit from being seen by hematology but this can be pursued as an outpatient.  No evidence of bleeding at this time.  Tobacco abuse He has been counseled.  Nicotine patch.  Cocaine  abuse Counseled.  Hypokalemia Will be repleted.  Check magnesium   DVT Prophylaxis: SCDs    Code Status: Full code Family Communication: Discussed with the patient Disposition Plan: As outlined above.  Await improvement in asthma exacerbation.    LOS: 0 days   [facility] Pager [phone] 10/18/2017, 10:43 AM  If 7PM-7AM, please contact night-coverage at [email or web address], password [facility]

## 2017-10-18 NOTE — H&P (Signed)
History and Physical    Anthony Finley YQI:[id number] DOB: 26-Jun-1970 DOA: 10/17/2017  Referring MD/NP/PA: Dr. Ezequiel Essex PCP: Synthia Innocent Audrea Muscat, NP  Patient coming from:  home  Chief Complaint: Shortness of breath and chest pain  I have personally briefly reviewed patient's old medical records in [place]   HPI: Anthony Finley is a 47 y.o. male with medical history significant of asthma and tobacco abuse; who presents with complaints of shortness of breath and chest pain starting Sunday night.  He has had left upper chest wall discomfort that is been present for several weeks.  However, pain changed acutely and was described as sharp and constant.  Over the weekend he had helped someone moved, but does not feel that that it is related to his symptoms were present before.  He also complained of shortness of breath with inability to catch his breath.  Associated symptoms included wheezing and mildly productive cough with white sputum.  He has a previous history of asthma, but is currently not on any inhalers.  Last notes requiring hospitalization for asthma exacerbation at the age of 1.  He reports smoking approximately half pack of cigarettes per day on average and has done so for 25 to 30 years.  He does not note any significant fevers, recent sick contacts, nausea, vomiting, diarrhea, or reports of bleeding.  Patient reports previously being told that he had a low platelet count at the [facility], but he does not have insurance and has been followed by her primary care provider.  ED Course: Upon admission patient was seen to be afebrile, pulse 93-1 30, respirations 13-29, blood pressures stable, noted saturation 95-100% on room air.  Labs revealed WBC 9.5, hemoglobin 15, platelets 29, troponin 0, and BNP 30.9.  Chest x-ray showed worsening COPD.  Patient was given hour-long breathing treatment with DuoNeb's, 2 g of magnesium sulfate, 125 mg of Solu-Medrol,  and empiric antibiotics of doxycycline.  The areas called to admit.  Review of Systems  Constitutional: Negative for fever and malaise/fatigue.  HENT: Negative for ear discharge and nosebleeds.   Eyes: Negative for double vision and photophobia.  Respiratory: Positive for cough, sputum production, shortness of breath and wheezing.   Cardiovascular: Positive for chest pain. Negative for leg swelling.  Gastrointestinal: Negative for abdominal pain, diarrhea and vomiting.  Genitourinary: Negative for dysuria and frequency.  Musculoskeletal: Negative for falls.  Neurological: Negative for speech change, focal weakness and loss of consciousness.  Psychiatric/Behavioral: Negative for memory loss and suicidal ideas.    Past Medical History:  Diagnosis Date  . Asthma    mostly as child-uses inh about q 3-4 months    Past Surgical History:  Procedure Laterality Date  . FRACTURE SURGERY     orif fx lt ankle age 49  . HAND SURGERY Left 08/2015  . ORIF ANKLE FRACTURE  02/22/2012   Procedure: OPEN REDUCTION INTERNAL FIXATION (ORIF) ANKLE FRACTURE;  Surgeon: Wylene Simmer, MD;  Location: [place];  Service: Orthopedics;  Laterality: Left;  Open Reduction Internal Fixation Left Ankle Bimalleolar Fracture;   Open Reduction Internal Fixation of Syndesmosis, and Repair of Deltoid Ligament  . PERCUTANEOUS PINNING Left 08/11/2015   Procedure: PINNING OF LEFT 4TH METACARPAL FRACTURE;  Surgeon: Milly Jakob, MD;  Location: [place];  Service: Orthopedics;  Laterality: Left;     reports that he has been smoking cigarettes.  He has been smoking about 0.50 packs per day. He  has never used smokeless tobacco. He reports that he drinks alcohol. He reports that he does not use drugs.  Allergies  Allergen Reactions  . Cephalosporins     Hives/ Swelling   . Fish-Derived Products     rash  . Keflex [Cephalexin]     unknown  . Penicillins Hives    Has patient had a PCN  reaction causing immediate rash, facial/tongue/throat swelling, SOB or lightheadedness with hypotension: No Has patient had a PCN reaction causing severe rash involving mucus membranes or skin necrosis: No Has patient had a PCN reaction that required hospitalization: Yes Has patient had a PCN reaction occurring within the last 10 years: Yes If all of the above answers are "NO", then may proceed with Cephalosporin use.     History reviewed. No pertinent family history.  Prior to Admission medications   Medication Sig Start Date End Date Taking? Authorizing Provider  albuterol (PROVENTIL HFA;VENTOLIN HFA) 108 (90 Base) MCG/ACT inhaler Inhale into the lungs every 6 (six) hours as needed for wheezing or shortness of breath.   Yes [provider]  ibuprofen (ADVIL,MOTRIN) 200 MG tablet Take 200 mg by mouth every 6 (six) hours as needed for mild pain or moderate pain.   Yes [provider]  EPINEPHrine 0.3 mg/0.3 mL IJ SOAJ injection Inject 0.3 mLs (0.3 mg total) into the muscle once as needed (anaphylaxis). 11/06/16   Virgel Manifold, MD    Physical Exam:  Constitutional: Middle-aged male who appears to be in some respiratory distress Vitals:   10/18/17 0052 10/18/17 0117 10/18/17 0145 10/18/17 0159  BP: (!) 115/59 107/71    Pulse: (!) 122 (!) 114 (!) 109 (!) 130  Resp: (!) 28 (!) 22 (!) 29   Temp:      TempSrc:      SpO2: 100% 99% 95% 97%  Weight:      Height:       Eyes: PERRL, lids and conjunctivae normal ENMT: Mucous membranes are moist. Posterior pharynx clear of any exudate or lesions.Normal dentition.  Neck: normal, supple, no masses, no thyromegaly Respiratory: Decreased overall aeration with positive expiratory wheezes appreciated throughout both lung fields.  O2 saturation maintained on room air. Cardiovascular: Regular rate and rhythm, no murmurs / rubs / gallops. No extremity edema. 2+ pedal pulses. No carotid bruits.  Tenderness palpation noted of the left  upper chest wall. Abdomen: no tenderness, no masses palpated. No hepatosplenomegaly. Bowel sounds positive.  Musculoskeletal: no clubbing / cyanosis. No joint deformity upper and lower extremities. Good ROM, no contractures. Normal muscle tone.  Skin: no rashes, lesions, ulcers. No induration Neurologic: CN 2-12 grossly intact. Sensation intact, DTR normal. Strength 5/5 in all 4.  Psychiatric: Normal judgment and insight. Alert and oriented x 3. Normal mood.     Labs on Admission: I have personally reviewed following labs and imaging studies  CBC: Recent Labs  Lab 10/17/17 2209  WBC 9.3  HGB 15.0  HCT 44.1  MCV 91.5  PLT 29*   Basic Metabolic Panel: Recent Labs  Lab 10/17/17 2209  NA 139  K 4.0  CL 107  CO2 25  GLUCOSE 128*  BUN 10  CREATININE 1.18  CALCIUM 8.5*   GFR: Estimated Creatinine Clearance: 95.6 mL/min (by C-G formula based on SCr of 1.18 mg/dL). Liver Function Tests: No results for input(s): AST, ALT, ALKPHOS, BILITOT, PROT, ALBUMIN in the last 168 hours. No results for input(s): LIPASE, AMYLASE in the last 168 hours. No results for input(s): AMMONIA  in the last 168 hours. Coagulation Profile: No results for input(s): INR, PROTIME in the last 168 hours. Cardiac Enzymes: No results for input(s): CKTOTAL, CKMB, CKMBINDEX, TROPONINI in the last 168 hours. BNP (last 3 results) No results for input(s): PROBNP in the last 8760 hours. HbA1C: No results for input(s): HGBA1C in the last 72 hours. CBG: No results for input(s): GLUCAP in the last 168 hours. Lipid Profile: No results for input(s): CHOL, HDL, LDLCALC, TRIG, CHOLHDL, LDLDIRECT in the last 72 hours. Thyroid Function Tests: No results for input(s): TSH, T4TOTAL, FREET4, T3FREE, THYROIDAB in the last 72 hours. Anemia Panel: No results for input(s): VITAMINB12, FOLATE, FERRITIN, TIBC, IRON, RETICCTPCT in the last 72 hours. Urine analysis: No results found for: COLORURINE, APPEARANCEUR, LABSPEC,  PHURINE, GLUCOSEU, HGBUR, BILIRUBINUR, KETONESUR, PROTEINUR, UROBILINOGEN, NITRITE, LEUKOCYTESUR Sepsis Labs: No results found for this or any previous visit (from the past 240 hour(s)).   Radiological Exams on Admission: Dg Chest 2 View  Result Date: 10/17/2017 CLINICAL DATA:  LEFT chest pain since last night, wheezing. History of asthma. EXAM: CHEST - 2 VIEW COMPARISON:  Chest radiograph April 17, 2013 FINDINGS: Cardiomediastinal silhouette is normal. LEFT greater than RIGHT apical bullous changes and increased. Mild bronchitic changes. Increased lung volumes with flattened hemidiaphragms. No pleural effusion or focal consolidation. No pneumothorax. Soft tissue planes and included osseous structures are non suspicious. IMPRESSION: Worsening COPD. Electronically Signed   By: Elon Alas M.D.   On: 10/17/2017 22:37    EKG: Independently reviewed.  Sinus tachycardia 104 bpm  Assessment/Plan Asthma/COPD exacerbation: Acute.  Patient presents with wheezing and shortness of breath.  Chest x-ray showing hyperinflation a suggestive of COPD.  Patient with known tobacco abuse history, but no PFTs performed to confirm diagnosis. - Admit to a telemetry bed - Duonebs qid and prn - Solumedrol 60 mg IV - Continue doxycycline - Mucinex  Chest pain: Patient reports left-sided chest pain over the last day.  Symptoms present with palpation.  D-dimer noted to be negative and therefore blood clot less likely. - Trend troponins - Check urine drug screen and lipid panel - May warrant further work-up including echocardiogram and/or cardiology consult  Tobacco abuse: Patient continues to report smoking. - Counseled on need of cessation of tobacco  Thrombocytopenia: Patient reports known history of low platelets, but no records to compare.  Patient denies any reports of bleeding. - Continue to monitor  DVT prophylaxis: SCDs Code Status: Full Family Communication: Discussed plan of care with the  patient and his wife present at bedside Disposition Plan: Likely discharge home in 2 to 3 days Consults called: None Admission status: Inpatient  Norval Morton MD [facility] Pager [phone]   If 7PM-7AM, please contact night-coverage [email or web address] Password [facility]  10/18/2017, 2:37 AM

## 2017-10-19 DIAGNOSIS — E538 Deficiency of other specified B group vitamins: Secondary | ICD-10-CM

## 2017-10-19 LAB — HEPATITIS PANEL, ACUTE
HEP B S AG: NEGATIVE
Hep A IgM: NEGATIVE
Hep B C IgM: NEGATIVE

## 2017-10-19 LAB — COMPREHENSIVE METABOLIC PANEL
ALT: 41 U/L (ref 17–63)
AST: 30 U/L (ref 15–41)
Albumin: 3.3 g/dL — ABNORMAL LOW (ref 3.5–5.0)
Alkaline Phosphatase: 43 U/L (ref 38–126)
Anion gap: 8 (ref 5–15)
BILIRUBIN TOTAL: 0.8 mg/dL (ref 0.3–1.2)
BUN: 12 mg/dL (ref 6–20)
CO2: 24 mmol/L (ref 22–32)
Calcium: 9.5 mg/dL (ref 8.9–10.3)
Chloride: 108 mmol/L (ref 101–111)
Creatinine, Ser: 1.11 mg/dL (ref 0.61–1.24)
Glucose, Bld: 188 mg/dL — ABNORMAL HIGH (ref 65–99)
POTASSIUM: 4.6 mmol/L (ref 3.5–5.1)
Sodium: 140 mmol/L (ref 135–145)
TOTAL PROTEIN: 6.2 g/dL — AB (ref 6.5–8.1)

## 2017-10-19 LAB — CBC
HEMATOCRIT: 43.4 % (ref 39.0–52.0)
Hemoglobin: 15 g/dL (ref 13.0–17.0)
MCH: 31.1 pg (ref 26.0–34.0)
MCHC: 34.6 g/dL (ref 30.0–36.0)
MCV: 89.9 fL (ref 78.0–100.0)
PLATELETS: 44 10*3/uL — AB (ref 150–400)
RBC: 4.83 MIL/uL (ref 4.22–5.81)
RDW: 13.5 % (ref 11.5–15.5)
WBC: 17 10*3/uL — ABNORMAL HIGH (ref 4.0–10.5)

## 2017-10-19 LAB — MAGNESIUM: MAGNESIUM: 1.9 mg/dL (ref 1.7–2.4)

## 2017-10-19 LAB — FOLATE RBC
FOLATE, RBC: 794 ng/mL (ref >498)
Folate, Hemolysate: 316.8 ng/mL
HEMATOCRIT: 39.9 % (ref 37.5–51.0)

## 2017-10-19 LAB — PATHOLOGIST SMEAR REVIEW

## 2017-10-19 NOTE — Progress Notes (Addendum)
[facility] PROGRESS NOTE  Anthony Finley YIR:[id number] DOB: [birth year] DOA: 10/17/2017  PCP: Marliss Coots, NP  Brief History/Interval Summary: 47 year old African-American male with a past medical history of asthma, tobacco abuse, cocaine abuse presented with shortness of breath and chest pain since Sunday night.  Patient was found to be wheezing.  He was thought to have acute asthma exacerbation.  He was hospitalized for further management.  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  Reason for Visit: Acute asthma exacerbation  Consultants: None  Procedures: None  Antibiotics: Doxycycline  Subjective/Interval History: Patient states that he is feeling better though still wheezing and continues to have a cough.  Denies any chest pain.    ROS: Denies any nausea or vomiting  Objective:  Vital Signs  Vitals:   10/18/17 1948 10/18/17 2205 10/19/17 0550 10/19/17 0849  BP:  116/84 117/82   Pulse:  (!) 103 80   Resp:  16 16   Temp:  98 F (36.7 C) (!) 97.4 F (36.3 C)   TempSrc:  Oral Oral   SpO2: 96% 96% 100% 95%  Weight:      Height:        Intake/Output Summary (Last 24 hours) at 10/19/2017 1136 Last data filed at 10/19/2017 0600 Gross per 24 hour  Intake 480 ml  Output -  Net 480 ml   Filed Weights   10/17/17 2058  Weight: 103.4 kg (228 lb)    General appearance: Awake alert.  In no distress. Resp: Normal effort at rest.  Continues to have wheezing bilateral lungs.  Compared to yesterday.  Few crackles at the bases.  No rhonchi.   Cardio: S1-S2 is normal regular.  No S3-S4.  No rubs murmurs or bruit GI: Abdomen is soft.  Nontender nondistended.  Bowel sounds are present.  No masses organomegaly. Extremities: No edema Neurologic: Alert and oriented x3.  No focal neurological deficits.    Lab Results:  Data Reviewed: I have personally reviewed following labs and imaging studies  CBC: Recent  Labs  Lab 10/17/17 2209 10/18/17 0508 10/19/17 0425  WBC 9.3 7.7 17.0*  HGB 15.0 13.2 15.0  HCT 44.1 39.2 43.4  MCV 91.5 91.2 89.9  PLT 29* 29* 44*    Basic Metabolic Panel: Recent Labs  Lab 10/17/17 2209 10/18/17 0508 10/19/17 0425  NA 139 140 140  K 4.0 3.4* 4.6  CL 107 108 108  CO2 25 25 24   GLUCOSE 128* 162* 188*  BUN 10 11 12   CREATININE 1.18 1.18 1.11  CALCIUM 8.5* 8.3* 9.5  MG  --   --  1.9    GFR: Estimated Creatinine Clearance: 101.6 mL/min (by C-G formula based on SCr of 1.11 mg/dL).  Liver Function Tests: Recent Labs  Lab 10/18/17 0508 10/19/17 0425  AST 29 30  ALT 31 41  ALKPHOS 42 43  BILITOT 0.2* 0.8  PROT 4.9* 6.2*  ALBUMIN 2.8* 3.3*    Coagulation Profile: Recent Labs  Lab 10/18/17 0902  INR 0.96    Cardiac Enzymes: Recent Labs  Lab 10/18/17 0508 10/18/17 0902 10/18/17 1455  TROPONINI <0.03 <0.03 <0.03    Lipid Profile: Recent Labs    10/18/17 0508  CHOL 134  HDL 53  LDLCALC 47  TRIG 170*  CHOLHDL 2.5    Thyroid Function Tests: Recent Labs    10/18/17 0902  TSH 1.691    Anemia Panel: Recent Labs    10/18/17  [place]*    Radiology Studies: Dg Chest 2 View  Result Date: 10/17/2017 CLINICAL DATA:  LEFT chest pain since last night, wheezing. History of asthma. EXAM: CHEST - 2 VIEW COMPARISON:  Chest radiograph April 17, 2013 FINDINGS: Cardiomediastinal silhouette is normal. LEFT greater than RIGHT apical bullous changes and increased. Mild bronchitic changes. Increased lung volumes with flattened hemidiaphragms. No pleural effusion or focal consolidation. No pneumothorax. Soft tissue planes and included osseous structures are non suspicious. IMPRESSION: Worsening COPD. Electronically Signed   By: Elon Alas M.D.   On: 10/17/2017 22:37   US Abdomen Complete  Result Date: 10/18/2017 CLINICAL DATA:  Thrombocytopenia EXAM: ABDOMEN ULTRASOUND COMPLETE COMPARISON:  None. FINDINGS: Gallbladder: No  gallstones or wall thickening visualized. No sonographic Murphy sign noted by sonographer. Common bile duct: Diameter: 3.1 mm and normal. Liver: Anechoic simple cyst in the left hepatic lobe measuring approximately 1.7 x 1.4 x 1.6 cm. No solid-appearing masses. No biliary dilatation. Within normal limits in parenchymal echogenicity. Portal vein is patent on color Doppler imaging with normal direction of blood flow towards the liver. IVC: No abnormality visualized. Pancreas: Visualized portion unremarkable. The tail is obscured by bowel gas. Spleen: No splenomegaly. Spleen measures approximately 12 cm in length on my measurements. Right Kidney: Length: 10.7 cm. Echogenicity within normal limits. No mass or hydronephrosis visualized. Left Kidney: Length: 10.6 cm. Echogenicity within normal limits. No mass or hydronephrosis visualized. Abdominal aorta: No aneurysm visualized. Other findings: No hemorrhage or abnormal fluid collections. IMPRESSION: No acute intra-abdominal abnormality. Simple appearing cyst in the left hepatic lobe measuring 1.7 x 1.4 x 1.6 cm. Electronically Signed   By: Ashley Royalty M.D.   On: 10/18/2017 18:43     Medications:  Scheduled: . budesonide (PULMICORT) nebulizer solution  0.25 mg Nebulization BID  . doxycycline  100 mg Oral Q12H  . folic acid  1 mg Oral Daily  . guaiFENesin  600 mg Oral BID  . ipratropium-albuterol  3 mL Nebulization QID  . methylPREDNISolone (SOLU-MEDROL) injection  60 mg Intravenous Q8H  . nicotine  21 mg Transdermal Daily  . thiamine  100 mg Oral Daily  . vitamin B-12  1,000 mcg Oral Daily   Continuous:  JOI:[id number] **OR** acetaminophen, ALPRAZolam, ipratropium-albuterol, ondansetron **OR** ondansetron (ZOFRAN) IV  Assessment/Plan:    Acute asthma exacerbation with likely a component of COPD as well Patient with history of childhood asthma.  Not noted to be on any maintenance medications.  However he also smokes quite heavily.  Likely has a  mixed picture.  Continue with nebulizer treatment, steroids and doxycycline and nebulized budesonide.  Slow to improve.  Continue current treatment for now.  Rise in WBC is most likely due to steroids.  Atypical chest pain This was present at the time of admission.  Troponins have been normal.  EKG did not show any ischemic changes.  Chest x-ray unremarkable.  Patient did have reproducible pain with palpation.  Likely related to the above or musculoskeletal.  No further cardiac work-up at this time.    Thrombocytopenia According the patient he was told that he has low platelet counts about 4 years ago.  Due to lack of insurance he has never followed up with any doctor.  Has not had any blood work done in the last 4 years.  He does drink alcohol but not on a daily basis.  He admits to using cocaine 5 days prior to admission.  He denies any history of skin lesions suggestive of  purpura or petechia.  Does not have any splenomegaly or hepatomegaly on examination.  Ultrasound did not show any splenomegaly or any other concerning findings.  B12 level was noted to be normal.  See below.  Folic acid level is pending.  TSH normal.  HIV normal.  Hepatitis panel unremarkable.  Peripheral smear unremarkable.  Platelet count slightly better today.  He will need outpatient hematology evaluation.    Vitamin B12 deficiency Vitamin B12 noted to be low at 1.  Folic acid level is pending.  Patient started on oral vitamin B12.  Also on oral folic acid.  Tobacco abuse He has been counseled.  Nicotine patch.  Cocaine abuse Counseled.  Hypokalemia Potassium level has improved.  Magnesium 1.9.  Simple hepatic cyst Incidentally noted on ultrasound.   DVT Prophylaxis: SCDs    Code Status: Full code Family Communication: Discussed with the patient Disposition Plan: Mobilize.  Anticipate discharge in 24 to 48 hours.    LOS: 1 day   [facility] Pager [phone] 10/19/2017, 11:36 AM  If  7PM-7AM, please contact night-coverage at [email or web address], password [facility]

## 2017-10-20 LAB — CBC
HEMATOCRIT: 42.5 % (ref 39.0–52.0)
HEMOGLOBIN: 14.7 g/dL (ref 13.0–17.0)
MCH: 31.7 pg (ref 26.0–34.0)
MCHC: 34.6 g/dL (ref 30.0–36.0)
MCV: 91.6 fL (ref 78.0–100.0)
Platelets: 77 10*3/uL — ABNORMAL LOW (ref 150–400)
RBC: 4.64 MIL/uL (ref 4.22–5.81)
RDW: 13.5 % (ref 11.5–15.5)
WBC: 24.8 10*3/uL — AB (ref 4.0–10.5)

## 2017-10-20 LAB — BASIC METABOLIC PANEL
ANION GAP: 7 (ref 5–15)
BUN: 15 mg/dL (ref 6–20)
CHLORIDE: 107 mmol/L (ref 101–111)
CO2: 25 mmol/L (ref 22–32)
CREATININE: 1.01 mg/dL (ref 0.61–1.24)
Calcium: 9.3 mg/dL (ref 8.9–10.3)
GFR calc non Af Amer: >60 mL/min (ref >60)
Glucose, Bld: 200 mg/dL — ABNORMAL HIGH (ref 65–99)
Potassium: 4.2 mmol/L (ref 3.5–5.1)
Sodium: 139 mmol/L (ref 135–145)

## 2017-10-20 MED ORDER — ALBUTEROL SULFATE (2.5 MG/3ML) 0.083% IN NEBU: 2.5000 mg | INHALATION_SOLUTION | RESPIRATORY_TRACT | Status: DC | PRN

## 2017-10-20 MED ORDER — ALBUTEROL SULFATE (2.5 MG/3ML) 0.083% IN NEBU
2.5000 mg | INHALATION_SOLUTION | Freq: Four times a day (QID) | RESPIRATORY_TRACT | Status: DC
  Administered 2017-10-21 (×2): 2.5 mg via RESPIRATORY_TRACT
  Filled 2017-10-20 (×3): qty 3

## 2017-10-20 MED ORDER — TIOTROPIUM BROMIDE MONOHYDRATE 18 MCG IN CAPS
18.0000 ug | ORAL_CAPSULE | Freq: Every day | RESPIRATORY_TRACT | Status: DC
  Administered 2017-10-20 – 2017-10-21 (×2): 18 ug via RESPIRATORY_TRACT
  Filled 2017-10-20: qty 5

## 2017-10-20 MED ORDER — ALUM & MAG HYDROXIDE-SIMETH 200-200-20 MG/5ML PO SUSP
15.0000 mL | Freq: Four times a day (QID) | ORAL | Status: DC | PRN
  Administered 2017-10-20: 15 mL via ORAL
  Filled 2017-10-20: qty 30

## 2017-10-20 MED ORDER — ALBUTEROL SULFATE HFA 108 (90 BASE) MCG/ACT IN AERS: 1.0000 | INHALATION_SPRAY | Freq: Four times a day (QID) | RESPIRATORY_TRACT | Status: DC

## 2017-10-20 MED ORDER — ALBUTEROL SULFATE (2.5 MG/3ML) 0.083% IN NEBU
2.5000 mg | INHALATION_SOLUTION | Freq: Four times a day (QID) | RESPIRATORY_TRACT | Status: DC
  Administered 2017-10-20 (×2): 2.5 mg via RESPIRATORY_TRACT
  Filled 2017-10-20 (×2): qty 3

## 2017-10-20 MED ORDER — SENNA 8.6 MG PO TABS
1.0000 | ORAL_TABLET | Freq: Every day | ORAL | Status: DC | PRN
  Administered 2017-10-20: 8.6 mg via ORAL
  Filled 2017-10-20 (×2): qty 1

## 2017-10-20 MED ORDER — IPRATROPIUM-ALBUTEROL 0.5-2.5 (3) MG/3ML IN SOLN
3.0000 mL | Freq: Three times a day (TID) | RESPIRATORY_TRACT | Status: DC
  Administered 2017-10-20: 3 mL via RESPIRATORY_TRACT
  Filled 2017-10-20: qty 3

## 2017-10-20 NOTE — Progress Notes (Signed)
[facility] PROGRESS NOTE  Anthony Finley YBO:[id number] DOB: 12-11-1970 DOA: 10/17/2017  PCP: Marliss Coots, NP  Brief History/Interval Summary: 47 year old African-American male with a past medical history of asthma, tobacco abuse, cocaine abuse presented with shortness of breath and chest pain since Sunday night.  Patient was found to be wheezing.  He was thought to have acute asthma exacerbation.  He was hospitalized for further management.  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  Reason for Visit: Acute asthma exacerbation  Consultants: None  Procedures: None  Antibiotics: Doxycycline  Subjective/Interval History: Pt still having cough and some mild wheezes. Feels better than when he first came in but reports that he is not at baseline.  ROS: Denies any nausea or vomiting  Objective:  Vital Signs  Vitals:   10/19/17 2109 10/19/17 2123 10/20/17 0520 10/20/17 0853  BP: 123/77  117/82   Pulse: 92  73   Resp: 20  16   Temp: 98.2 F (36.8 C)  97.6 F (36.4 C)   TempSrc: Oral  Oral   SpO2: 96% 96% 98% 93%  Weight:      Height:        Intake/Output Summary (Last 24 hours) at 10/20/2017 1251 Last data filed at 10/20/2017 0830 Gross per 24 hour  Intake 240 ml  Output -  Net 240 ml   Filed Weights   10/17/17 2058  Weight: 103.4 kg (228 lb)    General appearance: Pt in nad, alert and awake,  Resp: exp wheezes diffusely, + dry cough, equal chest rise Cardio: S1-S2 is normal regular.  No S3-S4.  No rubs murmurs or bruit GI: Abdomen is soft.  Nontender nondistended.  Bowel sounds are present.  No masses organomegaly. Extremities: No edema, warm Neurologic: Alert and oriented x3.  No focal neurological deficits.    Lab Results:  Data Reviewed: I have personally reviewed following labs and imaging studies  CBC: Recent Labs  Lab 10/17/17 2209 10/18/17 0508 10/18/17 0902 10/19/17 0425  10/20/17 0445  WBC 9.3 7.7  --  17.0* 24.8*  HGB 15.0 13.2  --  15.0 14.7  HCT 44.1 39.2 39.9 43.4 42.5  MCV 91.5 91.2  --  89.9 91.6  PLT 29* 29*  --  44* 77*    Basic Metabolic Panel: Recent Labs  Lab 10/17/17 2209 10/18/17 0508 10/19/17 0425 10/20/17 0445  NA 139 140 140 139  K 4.0 3.4* 4.6 4.2  CL 107 108 108 107  CO2 25 25 24 25   GLUCOSE 128* 162* 188* 200*  BUN 10 11 12 15   CREATININE 1.18 1.18 1.11 1.01  CALCIUM 8.5* 8.3* 9.5 9.3  MG  --   --  1.9  --     GFR: Estimated Creatinine Clearance: 111.6 mL/min (by C-G formula based on SCr of 1.01 mg/dL).  Liver Function Tests: Recent Labs  Lab 10/18/17 0508 10/19/17 0425  AST 29 30  ALT 31 41  ALKPHOS 42 43  BILITOT 0.2* 0.8  PROT 4.9* 6.2*  ALBUMIN 2.8* 3.3*    Coagulation Profile: Recent Labs  Lab 10/18/17 0902  INR 0.96    Cardiac Enzymes: Recent Labs  Lab 10/18/17 0508 10/18/17 0902 10/18/17 1455  TROPONINI <0.03 <0.03 <0.03    Lipid Profile: Recent Labs    10/18/17 0508  CHOL 134  HDL 53  LDLCALC 47  TRIG 170*  CHOLHDL 2.5    Thyroid Function Tests: Recent Labs  10/18/17 0902  TSH 1.691    Anemia Panel: Recent Labs    10/18/17 0902  VITAMINB12 171*    Radiology Studies: US Abdomen Complete  Result Date: 10/18/2017 CLINICAL DATA:  Thrombocytopenia EXAM: ABDOMEN ULTRASOUND COMPLETE COMPARISON:  None. FINDINGS: Gallbladder: No gallstones or wall thickening visualized. No sonographic Murphy sign noted by sonographer. Common bile duct: Diameter: 3.1 mm and normal. Liver: Anechoic simple cyst in the left hepatic lobe measuring approximately 1.7 x 1.4 x 1.6 cm. No solid-appearing masses. No biliary dilatation. Within normal limits in parenchymal echogenicity. Portal vein is patent on color Doppler imaging with normal direction of blood flow towards the liver. IVC: No abnormality visualized. Pancreas: Visualized portion unremarkable. The tail is obscured by bowel gas. Spleen: No  splenomegaly. Spleen measures approximately 12 cm in length on my measurements. Right Kidney: Length: 10.7 cm. Echogenicity within normal limits. No mass or hydronephrosis visualized. Left Kidney: Length: 10.6 cm. Echogenicity within normal limits. No mass or hydronephrosis visualized. Abdominal aorta: No aneurysm visualized. Other findings: No hemorrhage or abnormal fluid collections. IMPRESSION: No acute intra-abdominal abnormality. Simple appearing cyst in the left hepatic lobe measuring 1.7 x 1.4 x 1.6 cm. Electronically Signed   By: Ashley Royalty M.D.   On: 10/18/2017 18:43     Medications:  Scheduled: . budesonide (PULMICORT) nebulizer solution  0.25 mg Nebulization BID  . doxycycline  100 mg Oral Q12H  . folic acid  1 mg Oral Daily  . guaiFENesin  600 mg Oral BID  . ipratropium-albuterol  3 mL Nebulization TID  . methylPREDNISolone (SOLU-MEDROL) injection  60 mg Intravenous Q8H  . nicotine  21 mg Transdermal Daily  . thiamine  100 mg Oral Daily  . vitamin B-12  1,000 mcg Oral Daily   Continuous:  ZDG:[id number] **OR** acetaminophen, ALPRAZolam, ipratropium-albuterol, ondansetron **OR** ondansetron (ZOFRAN) IV, senna  Assessment/Plan:  Acute asthma exacerbation with likely a component of COPD as well - Pt would likely benefit from Spiriva.  We will add spiriva and d/c duonebs - continue pulmicort, continue solumedrol, albuterol scheduled q 6 hours with albuterol prn q 2 hours  Atypical chest pain This was present at the time of admission.  Troponins have been normal.  EKG did not show any ischemic changes.  Chest x-ray unremarkable.   - no complaints reported today.  Thrombocytopenia According the patient he was told that he has low platelet counts about 4 years ago.  Due to lack of insurance he has never followed up with any doctor.  Has not had any blood work done in the last 4 years.  He does drink alcohol but not on a daily basis.  He admits to using cocaine 5 days prior to  admission.  He denies any history of skin lesions suggestive of purpura or petechia.  Does not have any splenomegaly or hepatomegaly on examination.  Ultrasound did not show any splenomegaly or any other concerning findings.  B12 level was noted to be normal.   - Recommend hematologist follow up after discharge.   Vitamin B12 deficiency Vitamin B12 noted to be low at 1.  Folic acid level is pending.  Patient started on oral vitamin B12.  Also on oral folic acid.  Tobacco abuse He has been counseled.  Nicotine patch.  Cocaine abuse Counseled.  Hypokalemia Potassium level wnl on last check.   Magnesium 1.9.  Simple hepatic cyst Incidentally noted on ultrasound.  DVT Prophylaxis: SCDs    Code Status: Full code Family Communication: Discussed with the patient  Disposition Plan: Once respiratory status improved.    LOS: 2 days   [place] [facility] Pager [phone] 10/20/2017, 12:51 PM  If 7PM-7AM, please contact night-coverage at [email or web address], password [facility]

## 2017-10-21 DIAGNOSIS — J45901 Unspecified asthma with (acute) exacerbation: Secondary | ICD-10-CM

## 2017-10-21 DIAGNOSIS — J441 Chronic obstructive pulmonary disease with (acute) exacerbation: Secondary | ICD-10-CM

## 2017-10-21 MED ORDER — ALBUTEROL SULFATE HFA 108 (90 BASE) MCG/ACT IN AERS: 1.0000 | INHALATION_SPRAY | Freq: Four times a day (QID) | RESPIRATORY_TRACT | 0 refills | Status: DC | PRN

## 2017-10-21 MED ORDER — ALBUTEROL SULFATE HFA 108 (90 BASE) MCG/ACT IN AERS
1.0000 | INHALATION_SPRAY | Freq: Four times a day (QID) | RESPIRATORY_TRACT | Status: DC | PRN
  Filled 2017-10-21: qty 6.7

## 2017-10-21 MED ORDER — BISACODYL 5 MG PO TBEC
5.0000 mg | DELAYED_RELEASE_TABLET | Freq: Every day | ORAL | Status: DC | PRN
  Filled 2017-10-21: qty 1

## 2017-10-21 NOTE — Progress Notes (Signed)
Pt ambulated 2 lapses around unit 380 feet times 2, steady pace, holding normal conversation without noted dyspnea upon exertion. RA sat during ambulation was 98-100%. Pt rest O2 sats maintained and stable at 95%. Pt,  also, able to complete ADLs without exertion on RA sat 95-97%. SRP, RN

## 2017-10-21 NOTE — Progress Notes (Deleted)
[facility] PROGRESS NOTE  Anthony Finley CVE:[id number] DOB: 05/30/1970 DOA: 10/17/2017  PCP: Marliss Coots, NP  Brief History/Interval Summary: 47 year old African-American male with a past medical history of asthma, tobacco abuse, cocaine abuse presented with shortness of breath and chest pain since Sunday night.  Patient was found to be wheezing.  He was thought to have acute asthma exacerbation.  He was hospitalized for further management.  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  Reason for Visit: Acute asthma exacerbation  Consultants: None  Procedures: None  Antibiotics: Doxycycline  Subjective/Interval History: Pt reports some wheezes still, not at baseline per my discussion with him.  ROS: Denies any nausea or vomiting  Objective:  Vital Signs  Vitals:   10/20/17 2046 10/21/17 0503 10/21/17 0848 10/21/17 1113  BP: 122/81 118/82    Pulse: 86 76    Resp: 20 20    Temp: 97.8 F (36.6 C) 97.8 F (36.6 C)    TempSrc: Oral Oral    SpO2: 93% 97% 96% 96%  Weight:      Height:        Intake/Output Summary (Last 24 hours) at 10/21/2017 1245 Last data filed at 10/20/2017 1849 Gross per 24 hour  Intake 240 ml  Output -  Net 240 ml   Filed Weights   10/17/17 2058  Weight: 103.4 kg (228 lb)    General appearance: alert and awake, pt in nad. Resp: exp wheezes diffusely, + dry cough, equal chest rise Cardio: S1-S2 is normal regular.  No S3-S4.  No rubs murmurs or bruit GI: Abdomen is soft.  Nontender nondistended.  Bowel sounds are present.  No masses organomegaly. Extremities: No edema, warm Neurologic: Alert and oriented x3.  No focal neurological deficits.    Lab Results:  Data Reviewed: I have personally reviewed following labs and imaging studies  CBC: Recent Labs  Lab 10/17/17 2209 10/18/17 0508 10/18/17 0902 10/19/17 0425 10/20/17 0445  WBC 9.3 7.7  --  17.0* 24.8*  HGB 15.0 13.2   --  15.0 14.7  HCT 44.1 39.2 39.9 43.4 42.5  MCV 91.5 91.2  --  89.9 91.6  PLT 29* 29*  --  44* 77*    Basic Metabolic Panel: Recent Labs  Lab 10/17/17 2209 10/18/17 0508 10/19/17 0425 10/20/17 0445  NA 139 140 140 139  K 4.0 3.4* 4.6 4.2  CL 107 108 108 107  CO2 25 25 24 25   GLUCOSE 128* 162* 188* 200*  BUN 10 11 12 15   CREATININE 1.18 1.18 1.11 1.01  CALCIUM 8.5* 8.3* 9.5 9.3  MG  --   --  1.9  --     GFR: Estimated Creatinine Clearance: 111.6 mL/min (by C-G formula based on SCr of 1.01 mg/dL).  Liver Function Tests: Recent Labs  Lab 10/18/17 0508 10/19/17 0425  AST 29 30  ALT 31 41  ALKPHOS 42 43  BILITOT 0.2* 0.8  PROT 4.9* 6.2*  ALBUMIN 2.8* 3.3*    Coagulation Profile: Recent Labs  Lab 10/18/17 0902  INR 0.96    Cardiac Enzymes: Recent Labs  Lab 10/18/17 0508 10/18/17 0902 10/18/17 1455  TROPONINI <0.03 <0.03 <0.03    Lipid Profile: No results for input(s): CHOL, HDL, LDLCALC, TRIG, CHOLHDL, LDLDIRECT in the last 72 hours.  Thyroid Function Tests: No results for input(s): TSH, T4TOTAL, FREET4, T3FREE, THYROIDAB in the last 72 hours.  Anemia Panel: No results for input(s): VITAMINB12, FOLATE,  FERRITIN, TIBC, IRON, RETICCTPCT in the last 72 hours.  Radiology Studies: No results found.   Medications:  Scheduled: . albuterol  2.5 mg Nebulization QID  . budesonide (PULMICORT) nebulizer solution  0.25 mg Nebulization BID  . doxycycline  100 mg Oral Q12H  . folic acid  1 mg Oral Daily  . guaiFENesin  600 mg Oral BID  . methylPREDNISolone (SOLU-MEDROL) injection  60 mg Intravenous Q8H  . nicotine  21 mg Transdermal Daily  . thiamine  100 mg Oral Daily  . tiotropium  18 mcg Inhalation Daily  . vitamin B-12  1,000 mcg Oral Daily   Continuous:  KWI:[id number] **OR** acetaminophen, albuterol, ALPRAZolam, alum & mag hydroxide-simeth, bisacodyl, ondansetron **OR** ondansetron (ZOFRAN) IV  Assessment/Plan:  Acute asthma exacerbation  with likely a component of COPD as well - Continue patient on spiriva, Continue pulmicort, continue current dose of solumedrol, albuterol scheduled q 6 hours with albuterol prn q 2 hours  Atypical chest pain This was present at the time of admission.  Troponins have been normal.  EKG did not show any ischemic changes.  Chest x-ray unremarkable.   - no complaints reported today.  Thrombocytopenia According the patient he was told that he has low platelet counts about 4 years ago.  Due to lack of insurance he has never followed up with any doctor.  Has not had any blood work done in the last 4 years.  He does drink alcohol but not on a daily basis.  He admits to using cocaine 5 days prior to admission.  He denies any history of skin lesions suggestive of purpura or petechia.  Does not have any splenomegaly or hepatomegaly on examination.  Ultrasound did not show any splenomegaly or any other concerning findings.  B12 level was noted to be normal.   - Recommend hematologist follow up after discharge.   Vitamin B12 deficiency Vitamin B12 noted to be low at 1.  Folic acid level is pending.  Patient started on oral vitamin B12.  Also on oral folic acid.  Tobacco abuse He has been counseled.  Nicotine patch.  Cocaine abuse Counseled.  Hypokalemia Resolved.  Simple hepatic cyst Incidentally noted on ultrasound.  DVT Prophylaxis: SCDs    Code Status: Full code Family Communication: Discussed with the patient Disposition Plan: Once respiratory status improved.    LOS: 3 days   [place] [facility] Pager [phone] 10/21/2017, 12:45 PM  If 7PM-7AM, please contact night-coverage at [email or web address], password [facility]

## 2017-10-21 NOTE — Discharge Summary (Signed)
Physician Discharge Summary  Anthony Finley [id number]:[id number] DOB: 1970/06/18 DOA: 10/17/2017  PCP: Marliss Coots, NP  Admit date: 10/17/2017 Discharge date: 10/21/2017  Time spent: 36 minutes  Recommendations for Outpatient Follow-up:  1. Please ensure patient has maintenance medications   Discharge Diagnoses:  Principal Problem:   Asthma with COPD with exacerbation ([place]) Active Problems:   Ischemic chest pain   Tobacco abuse   Thrombocytopenia ([place])   Discharge Condition: stable  Diet recommendation: regular diet.  Filed Weights   10/17/17 2058  Weight: 103.4 kg (228 lb)    History of present illness:  47 year old African-American male with a past medical history of asthma, tobacco abuse, cocaine abuse presented with shortness of breath and chest pain since Sunday night.  Patient was found to be wheezing.  He was thought to have acute asthma exacerbation.  He was hospitalized for further management.  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  Hospital Course:  Acute asthma exacerbation with likely a component of COPD as well - Pt will be discharged on albuterol - he had 5 days of antibiotics and steroids. He is ambulating and maintaining his sats and requesting discharge.   Atypical chest pain This was present at the time of admission.  Troponins have been normal.  EKG did not show any ischemic changes.  Chest x-ray unremarkable.   - resolved  Thrombocytopenia According the patient he was told that he has low platelet counts about 4 years ago.  Due to lack of insurance he has never followed up with any doctor.  Has not had any blood work done in the last 4 years.  He does drink alcohol but not on a daily basis.  He admits to using cocaine 5 days prior to admission.  He denies any history of skin lesions suggestive of purpura or petechia.  Does not have any splenomegaly or hepatomegaly on examination.  Ultrasound did not  show any splenomegaly or any other concerning findings.  B12 level was noted to be normal.   - Recommend hematologist follow up after discharge.   Vitamin B12 deficiency Vitamin B12 noted to be low at 1.  Folic acid level is pending.  Patient started on oral vitamin B12.  Also on oral folic acid.  Tobacco abuse He has been counseled.  Nicotine patch.  Cocaine abuse Counseled.  Hypokalemia Potassium level wnl on last check.    Simple hepatic cyst Incidentally noted on ultrasound.  Procedures:  None  Consultations:  None  Discharge Exam: Vitals:   10/21/17 0848 10/21/17 1113  BP:    Pulse:    Resp:    Temp:    SpO2: 96% 96%    General: Pt in nad, alert and awake Cardiovascular: rrr, no rubs Respiratory: no increased wob, no wheezes  Discharge Instructions   Discharge Instructions    Diet - low sodium heart healthy   Complete by:  As directed    Discharge instructions   Complete by:  As directed    Please be sure to follow up with your primary care physician.   Increase activity slowly   Complete by:  As directed      Allergies as of 10/21/2017      Reactions   Cephalosporins    Hives/ Swelling   Fish-derived Products    rash   Keflex [cephalexin]    unknown   Penicillins Hives   Has patient had a PCN reaction causing immediate  rash, facial/tongue/throat swelling, SOB or lightheadedness with hypotension: No Has patient had a PCN reaction causing severe rash involving mucus membranes or skin necrosis: No Has patient had a PCN reaction that required hospitalization: Yes Has patient had a PCN reaction occurring within the last 10 years: Yes If all of the above answers are "NO", then may proceed with Cephalosporin use.      Medication List    TAKE these medications   albuterol 108 (90 Base) MCG/ACT inhaler Commonly known as:  PROVENTIL HFA;VENTOLIN HFA Inhale 1-2 puffs into the lungs every 6 (six) hours as needed for wheezing or shortness of  breath. What changed:  how much to take   EPINEPHrine 0.3 mg/0.3 mL Soaj injection Commonly known as:  EPI-PEN Inject 0.3 mLs (0.3 mg total) into the muscle once as needed (anaphylaxis).   ibuprofen 200 MG tablet Commonly known as:  ADVIL,MOTRIN Take 200 mg by mouth every 6 (six) hours as needed for mild pain or moderate pain.      Allergies  Allergen Reactions  . Cephalosporins     Hives/ Swelling   . Fish-Derived Products     rash  . Keflex [Cephalexin]     unknown  . Penicillins Hives    Has patient had a PCN reaction causing immediate rash, facial/tongue/throat swelling, SOB or lightheadedness with hypotension: No Has patient had a PCN reaction causing severe rash involving mucus membranes or skin necrosis: No Has patient had a PCN reaction that required hospitalization: Yes Has patient had a PCN reaction occurring within the last 10 years: Yes If all of the above answers are "NO", then may proceed with Cephalosporin use.       The results of significant diagnostics from this hospitalization (including imaging, microbiology, ancillary and laboratory) are listed below for reference.    Significant Diagnostic Studies: Dg Chest 2 View  Result Date: 10/17/2017 CLINICAL DATA:  LEFT chest pain since last night, wheezing. History of asthma. EXAM: CHEST - 2 VIEW COMPARISON:  Chest radiograph April 17, 2013 FINDINGS: Cardiomediastinal silhouette is normal. LEFT greater than RIGHT apical bullous changes and increased. Mild bronchitic changes. Increased lung volumes with flattened hemidiaphragms. No pleural effusion or focal consolidation. No pneumothorax. Soft tissue planes and included osseous structures are non suspicious. IMPRESSION: Worsening COPD. Electronically Signed   By: Elon Alas M.D.   On: 10/17/2017 22:37   US Abdomen Complete  Result Date: 10/18/2017 CLINICAL DATA:  Thrombocytopenia EXAM: ABDOMEN ULTRASOUND COMPLETE COMPARISON:  None. FINDINGS: Gallbladder:  No gallstones or wall thickening visualized. No sonographic Murphy sign noted by sonographer. Common bile duct: Diameter: 3.1 mm and normal. Liver: Anechoic simple cyst in the left hepatic lobe measuring approximately 1.7 x 1.4 x 1.6 cm. No solid-appearing masses. No biliary dilatation. Within normal limits in parenchymal echogenicity. Portal vein is patent on color Doppler imaging with normal direction of blood flow towards the liver. IVC: No abnormality visualized. Pancreas: Visualized portion unremarkable. The tail is obscured by bowel gas. Spleen: No splenomegaly. Spleen measures approximately 12 cm in length on my measurements. Right Kidney: Length: 10.7 cm. Echogenicity within normal limits. No mass or hydronephrosis visualized. Left Kidney: Length: 10.6 cm. Echogenicity within normal limits. No mass or hydronephrosis visualized. Abdominal aorta: No aneurysm visualized. Other findings: No hemorrhage or abnormal fluid collections. IMPRESSION: No acute intra-abdominal abnormality. Simple appearing cyst in the left hepatic lobe measuring 1.7 x 1.4 x 1.6 cm. Electronically Signed   By: Ashley Royalty M.D.   On: 10/18/2017 18:43  Microbiology: No results found for this or any previous visit (from the past 240 hour(s)).   Labs: Basic Metabolic Panel: Recent Labs  Lab 10/17/17 2209 10/18/17 0508 10/19/17 0425 10/20/17 0445  NA 139 140 140 139  K 4.0 3.4* 4.6 4.2  CL 107 108 108 107  CO2 25 25 24 25   GLUCOSE 128* 162* 188* 200*  BUN 10 11 12 15   CREATININE 1.18 1.18 1.11 1.01  CALCIUM 8.5* 8.3* 9.5 9.3  MG  --   --  1.9  --    Liver Function Tests: Recent Labs  Lab 10/18/17 0508 10/19/17 0425  AST 29 30  ALT 31 41  ALKPHOS 42 43  BILITOT 0.2* 0.8  PROT 4.9* 6.2*  ALBUMIN 2.8* 3.3*   No results for input(s): LIPASE, AMYLASE in the last 168 hours. No results for input(s): AMMONIA in the last 168 hours. CBC: Recent Labs  Lab 10/17/17 2209 10/18/17 0508 10/18/17 0902  10/19/17 0425 10/20/17 0445  WBC 9.3 7.7  --  17.0* 24.8*  HGB 15.0 13.2  --  15.0 14.7  HCT 44.1 39.2 39.9 43.4 42.5  MCV 91.5 91.2  --  89.9 91.6  PLT 29* 29*  --  44* 77*   Cardiac Enzymes: Recent Labs  Lab 10/18/17 0508 10/18/17 0902 10/18/17 1455  TROPONINI <0.03 <0.03 <0.03   BNP: BNP (last 3 results) Recent Labs    10/17/17 2209  BNP 30.9    ProBNP (last 3 results) No results for input(s): PROBNP in the last 8760 hours.  CBG: No results for input(s): GLUCAP in the last 168 hours.   Signed:  Velvet Bathe MD.  [facility] 10/21/2017, 1:09 PM

## 2017-10-21 NOTE — Progress Notes (Signed)
Pt d/c to home, instructions reviewed with pt acknowledged understanding. SRP, RN

## 2017-10-21 NOTE — Progress Notes (Signed)
Spoke with pt concerning medication needs. Pt states that he go to [facility]. A call to Audrea Muscat. NP with [facility] states that she is able to assist the pt with his medications and asked that pt come on Monday to [facility]. Explained this to the pt.  Pt states that he will go on Monday to [facility].

## 2017-10-21 NOTE — Progress Notes (Signed)
Pt request to be discharged, MD made aware and D/c orders noted. SRP, RN

## 2017-10-21 NOTE — Plan of Care (Signed)
Pt still experiencing wheezing and Rhonchi. Needs to have a good BM.

## 2018-05-18 ENCOUNTER — Encounter (HOSPITAL_COMMUNITY): Payer: Self-pay

## 2018-05-18 ENCOUNTER — Emergency Department (HOSPITAL_COMMUNITY): Payer: Self-pay

## 2018-05-18 ENCOUNTER — Emergency Department (HOSPITAL_COMMUNITY)
Admission: EM | Admit: 2018-05-18 | Discharge: 2018-05-18 | Disposition: A | Discharge status: Home / Self Care | Payer: Self-pay | Attending: Emergency Medicine | Admitting: Emergency Medicine

## 2018-05-18 DIAGNOSIS — J441 Chronic obstructive pulmonary disease with (acute) exacerbation: Secondary | ICD-10-CM | POA: Insufficient documentation

## 2018-05-18 DIAGNOSIS — F17210 Nicotine dependence, cigarettes, uncomplicated: Secondary | ICD-10-CM | POA: Insufficient documentation

## 2018-05-18 DIAGNOSIS — Z79899 Other long term (current) drug therapy: Secondary | ICD-10-CM | POA: Insufficient documentation

## 2018-05-18 MED ORDER — AEROCHAMBER PLUS FLO-VU MEDIUM MISC
1.0000 | Freq: Once | Status: AC
  Administered 2018-05-18: 1
  Filled 2018-05-18: qty 1

## 2018-05-18 MED ORDER — ALBUTEROL SULFATE (2.5 MG/3ML) 0.083% IN NEBU
5.0000 mg | INHALATION_SOLUTION | Freq: Once | RESPIRATORY_TRACT | Status: AC
  Administered 2018-05-18: 5 mg via RESPIRATORY_TRACT
  Filled 2018-05-18: qty 6

## 2018-05-18 MED ORDER — PREDNISONE 20 MG PO TABS: 40.0000 mg | ORAL_TABLET | Freq: Every day | ORAL | 0 refills | Status: DC

## 2018-05-18 MED ORDER — IPRATROPIUM-ALBUTEROL 0.5-2.5 (3) MG/3ML IN SOLN
RESPIRATORY_TRACT | Status: AC
  Administered 2018-05-18: 3 mL
  Filled 2018-05-18: qty 3

## 2018-05-18 MED ORDER — HYDROXYZINE HCL 25 MG PO TABS: 25.0000 mg | ORAL_TABLET | Freq: Every evening | ORAL | 0 refills | Status: DC | PRN

## 2018-05-18 MED ORDER — ALBUTEROL SULFATE HFA 108 (90 BASE) MCG/ACT IN AERS
2.0000 | INHALATION_SPRAY | RESPIRATORY_TRACT | Status: DC | PRN
  Administered 2018-05-18: 2 via RESPIRATORY_TRACT
  Filled 2018-05-18: qty 6.7

## 2018-05-18 MED ORDER — ALBUTEROL (5 MG/ML) CONTINUOUS INHALATION SOLN
15.0000 mg/h | INHALATION_SOLUTION | RESPIRATORY_TRACT | Status: AC
  Administered 2018-05-18: 15 mg/h via RESPIRATORY_TRACT
  Filled 2018-05-18: qty 20

## 2018-05-18 MED ORDER — AEROCHAMBER PLUS FLO-VU LARGE MISC
Status: AC
  Administered 2018-05-18: 1
  Filled 2018-05-18: qty 1

## 2018-05-18 MED ORDER — IPRATROPIUM-ALBUTEROL 0.5-2.5 (3) MG/3ML IN SOLN
3.0000 mL | Freq: Once | RESPIRATORY_TRACT | Status: AC
  Administered 2018-05-18: 3 mL via RESPIRATORY_TRACT
  Filled 2018-05-18: qty 3

## 2018-05-18 MED ORDER — PREDNISONE 20 MG PO TABS
40.0000 mg | ORAL_TABLET | Freq: Once | ORAL | Status: AC
  Administered 2018-05-18: 40 mg via ORAL
  Filled 2018-05-18: qty 2

## 2018-05-18 NOTE — ED Notes (Signed)
Patient verbalizes understanding of discharge instructions. Opportunity for questioning and answers were provided. Armband removed by staff, pt discharged from ED. Pt ambulatory to lobby.  

## 2018-05-18 NOTE — ED Triage Notes (Signed)
Pt presents for evaluation of cough and asthma exacerbation. No inhaler at home because he ran out. Has had URI symptoms x 1 week.

## 2018-05-18 NOTE — ED Notes (Signed)
ED Provider at bedside. 

## 2018-05-18 NOTE — ED Provider Notes (Addendum)
[facility] EMERGENCY DEPARTMENT Provider Note   CSN: [id number] Arrival date & time: 05/18/18  1245     History   Chief Complaint Chief Complaint  Patient presents with  . Asthma  . Cough    HPI Anthony Finley is a 48 y.o. male.  48 year old male with past medical history including COPD, tobacco abuse, thrombocytopenia who presents with shortness of breath and cough.  1 week ago he began having cough that was initially productive of green phlegm but his phlegm has subsequently turned clear.  He has had associated nasal congestion and shortness of breath consistent with wheezing.  He has been out of albuterol for quite a while.  No medications for his symptoms.  He has had some diarrhea but no vomiting.  His significant other is currently ill with similar symptoms.  No fevers.  The history is provided by the patient.  Asthma   Cough    Past Medical History:  Diagnosis Date  . Asthma    mostly as child-uses inh about q 3-4 months  . Thrombocytopenia ([place])   . Tobacco abuse     Patient Active Problem List   Diagnosis Date Noted  . Asthma with COPD with exacerbation ([place]) 10/18/2017  . Ischemic chest pain ([place]) 10/18/2017  . Tobacco abuse 10/18/2017  . Thrombocytopenia ([place]) 10/18/2017    Past Surgical History:  Procedure Laterality Date  . FRACTURE SURGERY     orif fx lt ankle age 52  . HAND SURGERY Left 08/2015  . ORIF ANKLE FRACTURE  02/22/2012   Procedure: OPEN REDUCTION INTERNAL FIXATION (ORIF) ANKLE FRACTURE;  Surgeon: Wylene Simmer, MD;  Location: [facility];  Service: Orthopedics;  Laterality: Left;  Open Reduction Internal Fixation Left Ankle Bimalleolar Fracture;   Open Reduction Internal Fixation of Syndesmosis, and Repair of Deltoid Ligament  . PERCUTANEOUS PINNING Left 08/11/2015   Procedure: PINNING OF LEFT 4TH METACARPAL FRACTURE;  Surgeon: Milly Jakob, MD;  Location: [facility];  Service: Orthopedics;   Laterality: Left;        Home Medications    Prior to Admission medications   Medication Sig Start Date End Date Taking? Authorizing Provider  albuterol (PROVENTIL HFA;VENTOLIN HFA) 108 (90 Base) MCG/ACT inhaler Inhale 1-2 puffs into the lungs every 6 (six) hours as needed for wheezing or shortness of breath. 10/21/17   Velvet Bathe, MD  EPINEPHrine 0.3 mg/0.3 mL IJ SOAJ injection Inject 0.3 mLs (0.3 mg total) into the muscle once as needed (anaphylaxis). 11/06/16   Virgel Manifold, MD  hydrOXYzine (ATARAX/VISTARIL) 25 MG tablet Take 1 tablet (25 mg total) by mouth at bedtime as needed for anxiety. 05/18/18   , Wenda Overland, MD  ibuprofen (ADVIL,MOTRIN) 200 MG tablet Take 200 mg by mouth every 6 (six) hours as needed for mild pain or moderate pain.    [provider]  predniSONE (DELTASONE) 20 MG tablet Take 2 tablets (40 mg total) by mouth daily. 05/19/18   , Wenda Overland, MD    Family History No family history on file.  Social History Social History   Tobacco Use  . Smoking status: Current Every Day Smoker    Packs/day: 0.50    Types: Cigarettes  . Smokeless tobacco: Never Used  Substance Use Topics  . Alcohol use: Yes    Comment: occ  . Drug use: No     Allergies   Cephalosporins; Fish-derived products; Keflex [cephalexin]; and Penicillins   Review of Systems Review of Systems  Respiratory: Positive for cough.    All other systems reviewed and are negative except that which was mentioned in HPI   Physical Exam Updated Vital Signs Pulse (!) 104   Temp (!) 97.5 F (36.4 C) (Oral)   Resp 20   SpO2 100%   Physical Exam Vitals signs and nursing note reviewed.  Constitutional:      General: He is not in acute distress.    Appearance: He is well-developed.  HENT:     Head: Normocephalic and atraumatic.     Mouth/Throat:     Mouth: Mucous membranes are moist.     Pharynx: Oropharynx is clear.  Eyes:     Conjunctiva/sclera: Conjunctivae  normal.  Neck:     Musculoskeletal: Neck supple.  Cardiovascular:     Rate and Rhythm: Normal rate and regular rhythm.     Heart sounds: Normal heart sounds. No murmur.  Pulmonary:     Effort: No respiratory distress.     Breath sounds: Wheezing present.     Comments: Mildly increased work of breathing without respiratory distress, bilateral diffuse inspiratory and expiratory wheezes Abdominal:     General: Bowel sounds are normal. There is no distension.     Palpations: Abdomen is soft.     Tenderness: There is no abdominal tenderness.  Skin:    General: Skin is warm and dry.  Neurological:     Mental Status: He is alert and oriented to person, place, and time.     Comments: Fluent speech  Psychiatric:        Judgment: Judgment normal.      ED Treatments / Results  Labs (all labs ordered are listed, but only abnormal results are displayed) Labs Reviewed - No data to display  EKG None  Radiology Dg Chest 2 View  Result Date: 05/18/2018 CLINICAL DATA:  Cough.  History of asthma EXAM: CHEST - 2 VIEW COMPARISON:  October 17, 2017 FINDINGS: There is scarring in the right upper lobe. 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 There is no appreciable edema or consolidation. The heart size is normal. Diminished vascularity to the upper lobes, particular on the left, is stable. No adenopathy. No bone lesions. 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 Stable right upper lobe scarring. No edema or consolidation. Stable cardiac silhouette. Electronically Signed   By: Lowella Grip III M.D.   On: 05/18/2018 17:01    Procedures .Critical Care Performed by: Sharlett Iles, MD Authorized by: Sharlett Iles, MD   Critical care provider statement:    Critical care time (minutes):  30   Critical care time was exclusive of:  Separately billable procedures and  treating other patients   Critical care was necessary to treat or prevent imminent or life-threatening deterioration of the following conditions:  Respiratory failure   Critical care was time spent personally by me on the following activities:  Development of treatment plan with patient or surrogate, evaluation of patient's response to treatment, examination of patient, obtaining history from patient or surrogate, ordering and performing treatments and interventions, ordering and review of radiographic studies and re-evaluation of patient's condition   (including critical care time)  Medications Ordered in ED Medications  albuterol (PROVENTIL,VENTOLIN) solution continuous neb (0 mg/hr Nebulization Stopped 05/18/18 1852)  albuterol (PROVENTIL HFA;VENTOLIN HFA) 108 (90 Base) MCG/ACT inhaler 2 puff (has no administration in time  range)  AEROCHAMBER PLUS FLO-VU MEDIUM MISC 1 each (has no administration in time range)  albuterol (PROVENTIL) (2.5 MG/3ML) 0.083% nebulizer solution 5 mg (5 mg Nebulization Given 05/18/18 1305)  ipratropium-albuterol (DUONEB) 0.5-2.5 (3) MG/3ML nebulizer solution (3 mLs  Given 05/18/18 1432)  predniSONE (DELTASONE) tablet 40 mg (40 mg Oral Given 05/18/18 1615)  ipratropium-albuterol (DUONEB) 0.5-2.5 (3) MG/3ML nebulizer solution 3 mL (3 mLs Nebulization Given 05/18/18 1615)     Initial Impression / Assessment and Plan / ED Course  I have reviewed the triage vital signs and the nursing notes.  Pertinent  imaging results that were available during my care of the patient were reviewed by me and considered in my medical decision making (see chart for details).    On exam, he had dyspnea, diffuse wheezes despite having received 2 albuterol treatments.  Gave third DuoNeb as well as prednisone.  His sputum has become clear and has improved and he is afebrile here therefore I do not feel he needs antibiotics.  I have spent at least 5 minutes counseling the patient on smoking  cessation.  CXR negative acute.  On repeat exam, pt still with increased WOB therefore placed on 1 hour continuous albuterol. On repeat assessment, WOB is improved and wheezes are now only expiratory. He feels well and wants to go home. He will f[facility].  He requested something to help with sleep therefore provided with Atarax.  Gave albuterol inhaler to go home.  Extensively reviewed return precautions. Final Clinical Impressions(s) / ED Diagnoses   Final diagnoses:  COPD exacerbation [facility])    ED Discharge Orders         Ordered    predniSONE (DELTASONE) 20 MG tablet  Daily     05/18/18 1849    hydrOXYzine (ATARAX/VISTARIL) 25 MG tablet  At bedtime PRN     05/18/18 1849           , Wenda Overland, MD 05/18/18 [place], Wenda Overland, MD 05/18/18 [phone]

## 2018-05-18 NOTE — ED Notes (Signed)
Respiratory notified of need for continuous neb 

## 2018-05-18 NOTE — ED Notes (Signed)
Received verbal order from [place] for [employer].

## 2018-08-04 ENCOUNTER — Ambulatory Visit: Payer: Self-pay | Admitting: *Deleted

## 2018-08-04 NOTE — Telephone Encounter (Signed)
Notified Megan, Agricultural consultant at [facility] of pt's pending arrival; she verbalized understanding.

## 2018-08-04 NOTE — Telephone Encounter (Signed)
Pt called on the [facility]; he says that he had symptoms for the past 2 days which started as fatigue, cough and temp 99; today his temp is 102.9 taken with a digital oral thermometer; he says that he is "having trouble breathing like a phelgm, and a dry cough; he is also having shortness of breath without exertion; the pt says that he has been self quararentined for the last 2 days; the pt states that he was told not to go to the ED, and to call his PCP instead; he states that he does not have a PCP, and needs to establish care with one; recommedations made per nurse triage protocol; the pt says that he was told not to go to the ED; explained that his symotoms have worsened, and his visit to the ED is warranted; pt instructed to go to [facility] ED; her verbalized understanding; he declined calling EMS, and will have his brother take him; will notify [facility] of pt's pending arrival     Reason for Disposition . SEVERE or constant chest pain (Exception: mild central chest pain, present only when coughing)  Answer Assessment - Initial Assessment Questions 1. COVID-19 DIAGNOSIS: "Who made your Coronavirus (COVID-19) diagnosis?" "Was it confirmed by a positive lab test?" If not diagnosed by a HCP, ask "Are there lots of cases (community spread) where you live?" (See public health department website, if unsure)   * MAJOR community spread: high number of cases; numbers of cases are increasing; many people hospitalized.   * MINOR community spread: low number of cases; not increasing; few or no people hospitalized     major 2. ONSET: "When did the COVID-19 symptoms start?"     08/02/2018 3. WORST SYMPTOM: "What is your worst symptom?" (e.g., cough, fever, shortness of breath, muscle aches)    Shortness of breath 4. COUGH: "How bad is the cough?"       Moderate to severe 5. FEVER: "Do you have a fever?" If so, ask: "What is your temperature, how was it measured, and when did it  start?"     102.9 6. RESPIRATORY STATUS: "Describe your breathing?" (e.g., shortness of breath, wheezing, unable to speak)    Shortness of breath without exertion, dry cough, nasal congestion 7. BETTER-SAME-WORSE: "Are you getting better, staying the same or getting worse compared to yesterday?"  If getting worse, ask, "In what way?"     No getting worse 8. HIGH RISK DISEASE: "Do you have any chronic medical problems?" (e.g., asthma, heart or lung disease, weak immune system, etc.)     asthma 9. PREGNANCY: "Is there any chance you are pregnant?" "When was your last menstrual period?"    n/a 10. OTHER SYMPTOMS: "Do you have any other symptoms?"  (e.g., runny nose, headache, sore throat, loss of smell)      Nasal congestion, throat feels irritated  Protocols used: CORONAVIRUS (COVID-19) DIAGNOSED OR SUSPECTED-A-AH

## 2018-08-05 ENCOUNTER — Encounter (HOSPITAL_COMMUNITY): Payer: Self-pay | Admitting: Emergency Medicine

## 2018-08-05 ENCOUNTER — Other Ambulatory Visit: Payer: Self-pay

## 2018-08-05 ENCOUNTER — Emergency Department (HOSPITAL_COMMUNITY): Payer: BLUE CROSS/BLUE SHIELD

## 2018-08-05 ENCOUNTER — Emergency Department (HOSPITAL_COMMUNITY)
Admission: EM | Admit: 2018-08-05 | Discharge: 2018-08-05 | Disposition: A | Discharge status: Home / Self Care | Payer: BLUE CROSS/BLUE SHIELD | Attending: Emergency Medicine | Admitting: Emergency Medicine

## 2018-08-05 DIAGNOSIS — J449 Chronic obstructive pulmonary disease, unspecified: Secondary | ICD-10-CM | POA: Diagnosis not present

## 2018-08-05 DIAGNOSIS — R05 Cough: Secondary | ICD-10-CM | POA: Diagnosis not present

## 2018-08-05 DIAGNOSIS — R509 Fever, unspecified: Secondary | ICD-10-CM | POA: Diagnosis not present

## 2018-08-05 DIAGNOSIS — Z20822 Contact with and (suspected) exposure to covid-19: Secondary | ICD-10-CM

## 2018-08-05 DIAGNOSIS — F17210 Nicotine dependence, cigarettes, uncomplicated: Secondary | ICD-10-CM | POA: Insufficient documentation

## 2018-08-05 DIAGNOSIS — R0602 Shortness of breath: Secondary | ICD-10-CM | POA: Insufficient documentation

## 2018-08-05 DIAGNOSIS — Z79899 Other long term (current) drug therapy: Secondary | ICD-10-CM | POA: Insufficient documentation

## 2018-08-05 DIAGNOSIS — R6889 Other general symptoms and signs: Secondary | ICD-10-CM

## 2018-08-05 HISTORY — DX: Chronic obstructive pulmonary disease, unspecified: J44.9

## 2018-08-05 MED ORDER — BENZONATATE 100 MG PO CAPS: 100.0000 mg | ORAL_CAPSULE | Freq: Three times a day (TID) | ORAL | 0 refills | Status: DC

## 2018-08-05 MED ORDER — ACETAMINOPHEN 500 MG PO TABS: 500.0000 mg | ORAL_TABLET | Freq: Four times a day (QID) | ORAL | 0 refills | Status: DC | PRN

## 2018-08-05 NOTE — ED Notes (Signed)
Pt given and verbalized understanding of d/c instructions and need for follow up with pcp. Told to return if s/s worsen. Pt verbalized understanding of self-isolating. No acute distress or questions upon ambulating out of department.

## 2018-08-05 NOTE — Discharge Instructions (Signed)
Person Under Monitoring Name: Anthony Finley  Location: [place] [address]   Infection Prevention Recommendations for Individuals Confirmed to have, or Being Evaluated for, 2019 Novel Coronavirus (COVID-19) Infection Who Receive Care at Home  Individuals who are confirmed to have, or are being evaluated for, COVID-19 should follow the prevention steps below until a healthcare provider or local or state health department says they can return to normal activities.  Stay home except to get medical care You should restrict activities outside your home, except for getting medical care. Do not go to work, school, or public areas, and do not use public transportation or taxis.  Call ahead before visiting your doctor Before your medical appointment, call the healthcare provider and tell them that you have, or are being evaluated for, COVID-19 infection. This will help the healthcare providers office take steps to keep other people from getting infected. Ask your healthcare provider to call the local or state health department.  Monitor your symptoms Seek prompt medical attention if your illness is worsening (e.g., difficulty breathing). Before going to your medical appointment, call the healthcare provider and tell them that you have, or are being evaluated for, COVID-19 infection. Ask your healthcare provider to call the local or state health department.  Wear a facemask You should wear a facemask that covers your nose and mouth when you are in the same room with other people and when you visit a healthcare provider. People who live with or visit you should also wear a facemask while they are in the same room with you.  Separate yourself from other people in your home As much as possible, you should stay in a different room from other people in your home. Also, you should use a separate bathroom, if available.  Avoid sharing household items You should  not share dishes, drinking glasses, cups, eating utensils, towels, bedding, or other items with other people in your home. After using these items, you should wash them thoroughly with soap and water.  Cover your coughs and sneezes Cover your mouth and nose with a tissue when you cough or sneeze, or you can cough or sneeze into your sleeve. Throw used tissues in a lined trash can, and immediately wash your hands with soap and water for at least 20 seconds or use an alcohol-based hand rub.  Wash your [facility] your hands often and thoroughly with soap and water for at least 20 seconds. You can use an alcohol-based hand sanitizer if soap and water are not available and if your hands are not visibly dirty. Avoid touching your eyes, nose, and mouth with unwashed hands.   Prevention Steps for Caregivers and Household Members of Individuals Confirmed to have, or Being Evaluated for, COVID-19 Infection Being Cared for in the Home  If you live with, or provide care at home for, a person confirmed to have, or being evaluated for, COVID-19 infection please follow these guidelines to prevent infection:  Follow healthcare providers instructions Make sure that you understand and can help the patient follow any healthcare provider instructions for all care.  Provide for the patients basic needs You should help the patient with basic needs in the home and provide support for getting groceries, prescriptions, and other personal needs.  Monitor the patients symptoms If they are getting sicker, call his or her medical provider and tell them that the patient has, or is being evaluated for, COVID-19 infection. This will help the healthcare  providers office take steps to keep other people from getting infected. Ask the healthcare provider to call the local or state health department.  Limit the number of people who have contact with the patient If possible, have only one caregiver for the  patient. Other household members should stay in another home or place of residence. If this is not possible, they should stay in another room, or be separated from the patient as much as possible. Use a separate bathroom, if available. Restrict visitors who do not have an essential need to be in the home.  Keep older adults, very young children, and other sick people away from the patient Keep older adults, very young children, and those who have compromised immune systems or chronic health conditions away from the patient. This includes people with chronic heart, lung, or kidney conditions, diabetes, and cancer.  Ensure good ventilation Make sure that shared spaces in the home have good air flow, such as from an air conditioner or an opened window, weather permitting.  Wash your hands often Wash your hands often and thoroughly with soap and water for at least 20 seconds. You can use an alcohol based hand sanitizer if soap and water are not available and if your hands are not visibly dirty. Avoid touching your eyes, nose, and mouth with unwashed hands. Use disposable paper towels to dry your hands. If not available, use dedicated cloth towels and replace them when they become wet.  Wear a facemask and gloves Wear a disposable facemask at all times in the room and gloves when you touch or have contact with the patients blood, body fluids, and/or secretions or excretions, such as sweat, saliva, sputum, nasal mucus, vomit, urine, or feces.  Ensure the mask fits over your nose and mouth tightly, and do not touch it during use. Throw out disposable facemasks and gloves after using them. Do not reuse. Wash your hands immediately after removing your facemask and gloves. If your personal clothing becomes contaminated, carefully remove clothing and launder. Wash your hands after handling contaminated clothing. Place all used disposable facemasks, gloves, and other waste in a lined container before  disposing them with other household waste. Remove gloves and wash your hands immediately after handling these items.  Do not share dishes, glasses, or other household items with the patient Avoid sharing household items. You should not share dishes, drinking glasses, cups, eating utensils, towels, bedding, or other items with a patient who is confirmed to have, or being evaluated for, COVID-19 infection. After the person uses these items, you should wash them thoroughly with soap and water.  Wash laundry thoroughly Immediately remove and wash clothes or bedding that have blood, body fluids, and/or secretions or excretions, such as sweat, saliva, sputum, nasal mucus, vomit, urine, or feces, on them. Wear gloves when handling laundry from the patient. Read and follow directions on labels of laundry or clothing items and detergent. In general, wash and dry with the warmest temperatures recommended on the label.  Clean all areas the individual has used often Clean all touchable surfaces, such as counters, tabletops, doorknobs, bathroom fixtures, toilets, phones, keyboards, tablets, and bedside tables, every day. Also, clean any surfaces that may have blood, body fluids, and/or secretions or excretions on them. Wear gloves when cleaning surfaces the patient has come in contact with. Use a diluted bleach solution (e.g., dilute bleach with 1 part bleach and 10 parts water) or a household disinfectant with a label that says EPA-registered for coronaviruses. To make  a bleach solution at home, add 1 tablespoon of bleach to 1 quart (4 cups) of water. For a larger supply, add  cup of bleach to 1 gallon (16 cups) of water. Read labels of cleaning products and follow recommendations provided on product labels. Labels contain instructions for safe and effective use of the cleaning product including precautions you should take when applying the product, such as wearing gloves or eye protection and making sure you  have good ventilation during use of the product. Remove gloves and wash hands immediately after cleaning.  Monitor yourself for signs and symptoms of illness Caregivers and household members are considered close contacts, should monitor their health, and will be asked to limit movement outside of the home to the extent possible. Follow the monitoring steps for close contacts listed on the symptom monitoring form.   ? If you have additional questions, contact your local health department or call the epidemiologist on call at [phone] (available 24/7). ? This guidance is subject to change. For the most up-to-date guidance from [facility], please refer to their website: YouBlogs.pl

## 2018-08-05 NOTE — ED Triage Notes (Signed)
Pt adds that he had diarrhea as well.

## 2018-08-05 NOTE — ED Notes (Signed)
X-ray at bedside

## 2018-08-05 NOTE — ED Triage Notes (Signed)
Pt c/o fever, dry  Cough, sore throat, SOb for 3 days. Reports was exposed to someone who had to be quarantined for 14 days but doesn't know if she was tested or positive for Covid 19.  Called PCP yesterday and was instructed to come to Ed, pt didn't come. Reports hasnt taken medications for fever today.

## 2018-08-05 NOTE — ED Provider Notes (Signed)
[facility] DEPT Provider Note   CSN: [id number] Arrival date & time: 08/05/18  1705    History   Chief Complaint Chief Complaint  Patient presents with  . Fever  . Cough  . Shortness of Breath    HPI Anthony Finley is a 48 y.o. male.     The history is provided by the patient. No language interpreter was used.  Fever  Associated symptoms: cough   Cough  Associated symptoms: fever and shortness of breath   Shortness of Breath  Associated symptoms: cough and fever      48 year old male with history of COPD, tobacco abuse, asthma presenting with concern of potential covid-19 infection. Patient report 5 days ago he visited friend who happened to be under self quarantine for potential occult infection.  2 days after that, he then developed subjective fever, chills, body aches, generalized fatigue, dry cough, throat irritation, shortness of breath, occasional bouts of diarrhea, and feeling wiped out.  He denies any specific treatment tried.  He mentioned reaching out to his PCP yesterday about his symptoms and was recommended to come to the ED.  He does have history of tobacco use and currently trying to quit.  He denies any recent travel.  He denies nausea or vomiting.   Past Medical History:  Diagnosis Date  . Asthma    mostly as child-uses inh about q 3-4 months  . COPD (chronic obstructive pulmonary disease) ([place])   . Thrombocytopenia ([place])   . Tobacco abuse     Patient Active Problem List   Diagnosis Date Noted  . Asthma with COPD with exacerbation ([place]) 10/18/2017  . Ischemic chest pain ([place]) 10/18/2017  . Tobacco abuse 10/18/2017  . Thrombocytopenia ([place]) 10/18/2017    Past Surgical History:  Procedure Laterality Date  . FRACTURE SURGERY     orif fx lt ankle age 32  . HAND SURGERY Left 08/2015  . ORIF ANKLE FRACTURE  02/22/2012   Procedure: OPEN REDUCTION INTERNAL FIXATION (ORIF) ANKLE FRACTURE;  Surgeon: Wylene Simmer, MD;   Location: [facility];  Service: Orthopedics;  Laterality: Left;  Open Reduction Internal Fixation Left Ankle Bimalleolar Fracture;   Open Reduction Internal Fixation of Syndesmosis, and Repair of Deltoid Ligament  . PERCUTANEOUS PINNING Left 08/11/2015   Procedure: PINNING OF LEFT 4TH METACARPAL FRACTURE;  Surgeon: Milly Jakob, MD;  Location: [facility];  Service: Orthopedics;  Laterality: Left;        Home Medications    Prior to Admission medications   Medication Sig Start Date End Date Taking? Authorizing Provider  albuterol (PROVENTIL HFA;VENTOLIN HFA) 108 (90 Base) MCG/ACT inhaler Inhale 1-2 puffs into the lungs every 6 (six) hours as needed for wheezing or shortness of breath. 10/21/17   Velvet Bathe, MD  EPINEPHrine 0.3 mg/0.3 mL IJ SOAJ injection Inject 0.3 mLs (0.3 mg total) into the muscle once as needed (anaphylaxis). 11/06/16   Virgel Manifold, MD  hydrOXYzine (ATARAX/VISTARIL) 25 MG tablet Take 1 tablet (25 mg total) by mouth at bedtime as needed for anxiety. 05/18/18   Little, Wenda Overland, MD  ibuprofen (ADVIL,MOTRIN) 200 MG tablet Take 200 mg by mouth every 6 (six) hours as needed for mild pain or moderate pain.    [provider]  predniSONE (DELTASONE) 20 MG tablet Take 2 tablets (40 mg total) by mouth daily. 05/19/18   Little, Wenda Overland, MD    Family History No family history on file.  Social History Social History  Tobacco Use  . Smoking status: Current Every Day Smoker    Packs/day: 0.50    Types: Cigarettes  . Smokeless tobacco: Never Used  Substance Use Topics  . Alcohol use: Yes    Comment: occ  . Drug use: No     Allergies   Cephalosporins; Fish-derived products; Keflex [cephalexin]; and Penicillins   Review of Systems Review of Systems  Constitutional: Positive for fever.  Respiratory: Positive for cough and shortness of breath.   All other systems reviewed and are negative.    Physical Exam Updated  Vital Signs BP (!) 126/92 (BP Location: Left Arm)   Pulse 86   Temp 97.8 F (36.6 C) (Oral)   Resp 20   Ht 5\' 11"  (1.803 m)   Wt 108.9 kg   SpO2 99%   BMI 33.47 kg/m   Physical Exam Vitals signs and nursing note reviewed.  Constitutional:      General: He is not in acute distress.    Appearance: He is well-developed.  HENT:     Head: Atraumatic.  Eyes:     Conjunctiva/sclera: Conjunctivae normal.  Neck:     Musculoskeletal: Neck supple.  Cardiovascular:     Rate and Rhythm: Normal rate and regular rhythm.  Pulmonary:     Effort: Pulmonary effort is normal.     Breath sounds: Normal breath sounds. No decreased breath sounds, wheezing, rhonchi or rales.  Chest:     Chest wall: No tenderness.  Abdominal:     Palpations: Abdomen is soft.     Tenderness: There is no abdominal tenderness.  Skin:    Findings: No rash.  Neurological:     Mental Status: He is alert and oriented to person, place, and time.      ED Treatments / Results  Labs (all labs ordered are listed, but only abnormal results are displayed) Labs Reviewed - No data to display  EKG None  Radiology Dg Chest Portable 1 View  Result Date: 08/05/2018 CLINICAL DATA:  Shortness of breath, fever for 3 days EXAM: PORTABLE CHEST 1 VIEW COMPARISON:  05/18/2018 FINDINGS: The heart size and mediastinal contours are within normal limits. Both lungs are clear. The visualized skeletal structures are unremarkable. IMPRESSION: No active disease. Electronically Signed   By: Kathreen Devoid   On: 08/05/2018 18:27    Procedures Procedures (including critical care time)  Medications Ordered in ED Medications - No data to display   Initial Impression / Assessment and Plan / ED Course  I have reviewed the triage vital signs and the nursing notes.  Pertinent labs & imaging results that were available during my care of the patient were reviewed by me and considered in my medical decision making (see chart for details).         BP (!) 126/92 (BP Location: Left Arm)   Pulse 86   Temp 97.8 F (36.6 C) (Oral)   Resp 20   Ht 5\' 11"  (1.803 m)   Wt 108.9 kg   SpO2 99%   BMI 33.47 kg/m    Final Clinical Impressions(s) / ED Diagnoses   Final diagnoses:  Suspected Covid-19 Virus Infection    ED Discharge Orders         Ordered    acetaminophen (TYLENOL) 500 MG tablet  Every 6 hours PRN     08/05/18 1908    benzonatate (TESSALON) 100 MG capsule  Every 8 hours     08/05/18 1908  CAMRON MONDAY was evaluated in Emergency Department on 08/05/2018 for the symptoms described in the history of present illness. He was evaluated in the context of the global COVID-19 pandemic, which necessitated consideration that the patient might be at risk for infection with the SARS-CoV-2 virus that causes COVID-19. Institutional protocols and algorithms that pertain to the evaluation of patients at risk for COVID-19 are in a state of rapid change based on information released by regulatory bodies including the CDC and federal and state organizations. These policies and algorithms were followed during the patient's care in the ED.      Domenic Moras, PA-C 08/05/18 1909    Drenda Freeze, MD 08/05/18 [phone]

## 2018-08-05 NOTE — ED Notes (Signed)
Bed: WA18 Expected date:  Expected time:  Means of arrival:  Comments: 

## 2018-08-23 ENCOUNTER — Emergency Department (HOSPITAL_COMMUNITY): Payer: BLUE CROSS/BLUE SHIELD

## 2018-08-23 ENCOUNTER — Emergency Department (HOSPITAL_COMMUNITY)
Admission: EM | Admit: 2018-08-23 | Discharge: 2018-08-23 | Disposition: A | Discharge status: Home / Self Care | Payer: BLUE CROSS/BLUE SHIELD | Attending: Emergency Medicine | Admitting: Emergency Medicine

## 2018-08-23 ENCOUNTER — Other Ambulatory Visit: Payer: Self-pay

## 2018-08-23 ENCOUNTER — Encounter (HOSPITAL_COMMUNITY): Payer: Self-pay

## 2018-08-23 DIAGNOSIS — R0600 Dyspnea, unspecified: Secondary | ICD-10-CM | POA: Diagnosis present

## 2018-08-23 DIAGNOSIS — F17210 Nicotine dependence, cigarettes, uncomplicated: Secondary | ICD-10-CM | POA: Insufficient documentation

## 2018-08-23 DIAGNOSIS — J45909 Unspecified asthma, uncomplicated: Secondary | ICD-10-CM | POA: Diagnosis not present

## 2018-08-23 DIAGNOSIS — J441 Chronic obstructive pulmonary disease with (acute) exacerbation: Secondary | ICD-10-CM | POA: Insufficient documentation

## 2018-08-23 LAB — BASIC METABOLIC PANEL
Anion gap: 6 (ref 5–15)
BUN: 16 mg/dL (ref 6–20)
CO2: 23 mmol/L (ref 22–32)
Calcium: 8.8 mg/dL — ABNORMAL LOW (ref 8.9–10.3)
Chloride: 107 mmol/L (ref 98–111)
Creatinine, Ser: 1.09 mg/dL (ref 0.61–1.24)
GFR calc Af Amer: >60 mL/min (ref >60)
GFR calc non Af Amer: >60 mL/min (ref >60)
Glucose, Bld: 118 mg/dL — ABNORMAL HIGH (ref 70–99)
Potassium: 4.2 mmol/L (ref 3.5–5.1)
Sodium: 136 mmol/L (ref 135–145)

## 2018-08-23 LAB — CBC WITH DIFFERENTIAL/PLATELET
Abs Immature Granulocytes: 0.09 10*3/uL — ABNORMAL HIGH (ref 0.00–0.07)
Basophils Absolute: 0 10*3/uL (ref 0.0–0.1)
Basophils Relative: 0 %
Eosinophils Absolute: 0.1 10*3/uL (ref 0.0–0.5)
Eosinophils Relative: 1 %
HCT: 45.6 % (ref 39.0–52.0)
Hemoglobin: 15.4 g/dL (ref 13.0–17.0)
Immature Granulocytes: 1 %
Lymphocytes Relative: 23 %
Lymphs Abs: 2 10*3/uL (ref 0.7–4.0)
MCH: 30.6 pg (ref 26.0–34.0)
MCHC: 33.8 g/dL (ref 30.0–36.0)
MCV: 90.5 fL (ref 80.0–100.0)
Monocytes Absolute: 0.8 10*3/uL (ref 0.1–1.0)
Monocytes Relative: 10 %
Neutro Abs: 5.6 10*3/uL (ref 1.7–7.7)
Neutrophils Relative %: 65 %
Platelets: 43 10*3/uL — ABNORMAL LOW (ref 150–400)
RBC: 5.04 MIL/uL (ref 4.22–5.81)
RDW: 12.6 % (ref 11.5–15.5)
WBC: 8.7 10*3/uL (ref 4.0–10.5)
nRBC: 0 % (ref 0.0–0.2)

## 2018-08-23 LAB — TROPONIN I: Troponin I: <0.03 ng/mL (ref <0.03)

## 2018-08-23 LAB — BRAIN NATRIURETIC PEPTIDE: B Natriuretic Peptide: 6.1 pg/mL (ref 0.0–100.0)

## 2018-08-23 MED ORDER — ALBUTEROL SULFATE HFA 108 (90 BASE) MCG/ACT IN AERS
8.0000 | INHALATION_SPRAY | Freq: Once | RESPIRATORY_TRACT | Status: AC
  Administered 2018-08-23: 8 via RESPIRATORY_TRACT
  Filled 2018-08-23: qty 6.7

## 2018-08-23 MED ORDER — AEROCHAMBER PLUS FLO-VU MISC: 1.0000 | Freq: Once | Status: DC

## 2018-08-23 MED ORDER — AEROCHAMBER Z-STAT PLUS/MEDIUM MISC: 1.0000 | Freq: Once | Status: DC

## 2018-08-23 MED ORDER — PREDNISONE 20 MG PO TABS: ORAL_TABLET | ORAL | 0 refills | Status: DC

## 2018-08-23 MED ORDER — PREDNISONE 20 MG PO TABS
60.0000 mg | ORAL_TABLET | Freq: Once | ORAL | Status: AC
  Administered 2018-08-23: 60 mg via ORAL
  Filled 2018-08-23: qty 3

## 2018-08-23 NOTE — ED Provider Notes (Signed)
[facility] DEPT Provider Note   CSN: [id number] Arrival date & time: 08/23/18  1250    History   Chief Complaint Chief Complaint  Patient presents with  . Cough  . Shortness of Breath    HPI Anthony Finley is a 48 y.o. male.     HPI  48 year old male presents with cough and dyspnea for about 3 weeks.  He was seen here a couple days after started.  Originally he had a fever and had been in contact with someone that was being quarantine but he does not know if they were tested or tested positive for COVID-19.  He states that since then he has not been better or worse and continues to have nonproductive cough, dyspnea, and chest pain when coughing.  Some symptoms are a little worse at night when he lays flat.  No specific leg swelling.  No recent fever.  He has heard some wheezing and has a history of COPD/asthma but states this does not feel like that.  Past Medical History:  Diagnosis Date  . Asthma    mostly as child-uses inh about q 3-4 months  . COPD (chronic obstructive pulmonary disease) ([place])   . Thrombocytopenia ([place])   . Tobacco abuse     Patient Active Problem List   Diagnosis Date Noted  . Asthma with COPD with exacerbation ([place]) 10/18/2017  . Ischemic chest pain ([place]) 10/18/2017  . Tobacco abuse 10/18/2017  . Thrombocytopenia ([place]) 10/18/2017    Past Surgical History:  Procedure Laterality Date  . FRACTURE SURGERY     orif fx lt ankle age 85  . HAND SURGERY Left 08/2015  . ORIF ANKLE FRACTURE  02/22/2012   Procedure: OPEN REDUCTION INTERNAL FIXATION (ORIF) ANKLE FRACTURE;  Surgeon: Wylene Simmer, MD;  Location: [facility];  Service: Orthopedics;  Laterality: Left;  Open Reduction Internal Fixation Left Ankle Bimalleolar Fracture;   Open Reduction Internal Fixation of Syndesmosis, and Repair of Deltoid Ligament  . PERCUTANEOUS PINNING Left 08/11/2015   Procedure: PINNING OF LEFT 4TH METACARPAL FRACTURE;  Surgeon:  Milly Jakob, MD;  Location: [facility];  Service: Orthopedics;  Laterality: Left;        Home Medications    Prior to Admission medications   Medication Sig Start Date End Date Taking? Authorizing Provider  acetaminophen (TYLENOL) 325 MG tablet Take 650 mg by mouth every 4 (four) hours as needed for mild pain or headache.   Yes [provider]  albuterol (PROVENTIL HFA;VENTOLIN HFA) 108 (90 Base) MCG/ACT inhaler Inhale 1-2 puffs into the lungs every 6 (six) hours as needed for wheezing or shortness of breath. 10/21/17  Yes Velvet Bathe, MD  guaifenesin (ROBITUSSIN) 100 MG/5ML syrup Take 200 mg by mouth 3 (three) times daily as needed for cough.   Yes [provider]  acetaminophen (TYLENOL) 500 MG tablet Take 1 tablet (500 mg total) by mouth every 6 (six) hours as needed. Patient not taking: Reported on 08/23/2018 08/05/18   Domenic Moras, PA-C  benzonatate (TESSALON) 100 MG capsule Take 1 capsule (100 mg total) by mouth every 8 (eight) hours. Patient not taking: Reported on 08/23/2018 08/05/18   Domenic Moras, PA-C  EPINEPHrine 0.3 mg/0.3 mL IJ SOAJ injection Inject 0.3 mLs (0.3 mg total) into the muscle once as needed (anaphylaxis). Patient not taking: Reported on 08/23/2018 11/06/16   Virgel Manifold, MD  hydrOXYzine (ATARAX/VISTARIL) 25 MG tablet Take 1 tablet (25 mg total) by mouth at bedtime as  needed for anxiety. Patient not taking: Reported on 08/23/2018 05/18/18   Little, Wenda Overland, MD  predniSONE (DELTASONE) 20 MG tablet 2 tabs po daily x 4 days 08/24/18   Sherwood Gambler, MD    Family History No family history on file.  Social History Social History   Tobacco Use  . Smoking status: Current Every Day Smoker    Packs/day: 0.50    Types: Cigarettes  . Smokeless tobacco: Never Used  Substance Use Topics  . Alcohol use: Yes    Comment: occ  . Drug use: No     Allergies   Cephalosporins; Fish-derived products; Keflex [cephalexin]; and Penicillins    Review of Systems Review of Systems  Constitutional: Negative for fever.  Respiratory: Positive for cough, shortness of breath and wheezing.   Cardiovascular: Positive for chest pain. Negative for leg swelling.  Gastrointestinal: Negative for abdominal pain and vomiting.  All other systems reviewed and are negative.    Physical Exam Updated Vital Signs BP 116/90   Pulse 76   Temp 97.6 F (36.4 C) (Oral)   Resp 20   Ht 6' (1.829 m)   Wt 111.6 kg   SpO2 100%   BMI 33.36 kg/m   Physical Exam Vitals signs and nursing note reviewed.  Constitutional:      General: He is not in acute distress.    Appearance: He is well-developed. He is obese. He is not ill-appearing or diaphoretic.  HENT:     Head: Normocephalic and atraumatic.     Right Ear: External ear normal.     Left Ear: External ear normal.     Nose: Nose normal.  Eyes:     General:        Right eye: No discharge.        Left eye: No discharge.  Neck:     Musculoskeletal: Neck supple.  Cardiovascular:     Rate and Rhythm: Normal rate and regular rhythm.     Heart sounds: Normal heart sounds.  Pulmonary:     Effort: Pulmonary effort is normal. No tachypnea, accessory muscle usage or respiratory distress.     Breath sounds: Decreased breath sounds present. No wheezing.  Abdominal:     Palpations: Abdomen is soft.     Tenderness: There is no abdominal tenderness.  Skin:    General: Skin is warm and dry.  Neurological:     Mental Status: He is alert.  Psychiatric:        Mood and Affect: Mood is not anxious.      ED Treatments / Results  Labs (all labs ordered are listed, but only abnormal results are displayed) Labs Reviewed  BASIC METABOLIC PANEL - Abnormal; Notable for the following components:      Result Value   Glucose, Bld 118 (*)    Calcium 8.8 (*)    All other components within normal limits  CBC WITH DIFFERENTIAL/PLATELET - Abnormal; Notable for the following components:   Platelets 43  (*)    Abs Immature Granulocytes 0.09 (*)    All other components within normal limits  TROPONIN I  BRAIN NATRIURETIC PEPTIDE    EKG EKG Interpretation  Date/Time:  Wednesday August 23 2018 13:47:26 EDT Ventricular Rate:  88 PR Interval:    QRS Duration: 85 QT Interval:  349 QTC Calculation: 423 R Axis:   64 Text Interpretation:  Sinus rhythm no acute ST/T changes no significant change since August 05 2018 Confirmed by Sherwood Gambler [phone]) on 08/23/2018 1:55:38  PM   Radiology Dg Chest Portable 1 View  Result Date: 08/23/2018 CLINICAL DATA:  Cough and shortness of breath EXAM: PORTABLE CHEST 1 VIEW COMPARISON:  August 05, 2018 FINDINGS: There is no appreciable edema or consolidation. There is evident bullous disease in the upper lobes. Heart size is normal. The pulmonary vascularity reflects the bullous disease in the upper lobes and is stable. No adenopathy. No bone lesions. 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 No edema or consolidation. Stable cardiac silhouette. Electronically Signed   By: Lowella Grip III M.D.   On: 08/23/2018 14:37    Procedures Procedures (including critical care time)  Medications Ordered in ED Medications  aerochamber Z-Stat Plus/medium 1 each (1 each Other Not Given 08/23/18 1356)  predniSONE (DELTASONE) tablet 60 mg (has no administration in time range)  albuterol (VENTOLIN HFA) 108 (90 Base) MCG/ACT inhaler 8 puff (8 puffs Inhalation Given 08/23/18 1334)     Initial Impression / Assessment and Plan / ED Course  I have reviewed the triage vital signs and the nursing notes.  Pertinent labs & imaging results that were available during my care of the patient were reviewed by me and considered in my medical decision making (see chart for details).        Patient reports he is feeling better after albuterol.  Likely has a component of COPD.  He could have possibly had COVID early on but given no worsening of his symptoms or new fever, I  think COVID is less likely.  Given the improvement with albuterol and his history of COPD and smoking, I will place on prednisone burst, first dose given here.  He was instructed to use the albuterol every 4 hours as needed.  Follow-up with PCP.  No evidence of pneumonia or pulmonary edema.  I have extremely low suspicion for ACS, PE or dissection.  Discharged home with return precautions.  KJ IMBERT was evaluated in Emergency Department on 08/23/2018 for the symptoms described in the history of present illness. He was evaluated in the context of the global COVID-19 pandemic, which necessitated consideration that the patient might be at risk for infection with the SARS-CoV-2 virus that causes COVID-19. Institutional protocols and algorithms that pertain to the evaluation of patients at risk for COVID-19 are in a state of rapid change based on information released by regulatory bodies including the CDC and federal and state organizations. These policies and algorithms were followed during the patient's care in the ED.   Final Clinical Impressions(s) / ED Diagnoses   Final diagnoses:  COPD exacerbation [facility])    ED Discharge Orders         Ordered    predniSONE (DELTASONE) 20 MG tablet     08/23/18 1525           Sherwood Gambler, MD 08/23/18 1534

## 2018-08-23 NOTE — ED Triage Notes (Signed)
Pt states he was seen on 4/4 for covid like symptoms. Pt states that his symptoms have not gotten better nor worse. Pt states he has continued to have a cough and SHOB.

## 2018-08-23 NOTE — Discharge Instructions (Addendum)
If you develop severe trouble breathing, severe chest pain, vomiting, coughing up blood, or any other new/concerning symptoms then return to the ER for evaluation.  Use your albuterol inhaler 1-2 puffs every 4 hours as needed for cough or shortness of breath.  Start the steroid prescription tomorrow as you were given the first dose today.

## 2019-08-28 ENCOUNTER — Other Ambulatory Visit: Payer: Self-pay

## 2019-08-28 ENCOUNTER — Encounter (HOSPITAL_COMMUNITY): Payer: Self-pay | Admitting: Emergency Medicine

## 2019-08-28 ENCOUNTER — Emergency Department (HOSPITAL_COMMUNITY)
Admission: EM | Admit: 2019-08-28 | Discharge: 2019-08-29 | Disposition: A | Discharge status: Home / Self Care | Payer: 59 | Attending: Emergency Medicine | Admitting: Emergency Medicine

## 2019-08-28 DIAGNOSIS — Y93E9 Activity, other interior property and clothing maintenance: Secondary | ICD-10-CM | POA: Insufficient documentation

## 2019-08-28 DIAGNOSIS — S43005A Unspecified dislocation of left shoulder joint, initial encounter: Secondary | ICD-10-CM | POA: Diagnosis not present

## 2019-08-28 DIAGNOSIS — J449 Chronic obstructive pulmonary disease, unspecified: Secondary | ICD-10-CM | POA: Insufficient documentation

## 2019-08-28 DIAGNOSIS — Y999 Unspecified external cause status: Secondary | ICD-10-CM | POA: Insufficient documentation

## 2019-08-28 DIAGNOSIS — W1830XA Fall on same level, unspecified, initial encounter: Secondary | ICD-10-CM | POA: Diagnosis not present

## 2019-08-28 DIAGNOSIS — S4992XA Unspecified injury of left shoulder and upper arm, initial encounter: Secondary | ICD-10-CM | POA: Diagnosis present

## 2019-08-28 DIAGNOSIS — F17210 Nicotine dependence, cigarettes, uncomplicated: Secondary | ICD-10-CM | POA: Diagnosis not present

## 2019-08-28 DIAGNOSIS — Y92039 Unspecified place in apartment as the place of occurrence of the external cause: Secondary | ICD-10-CM | POA: Diagnosis not present

## 2019-08-28 MED ORDER — LIDOCAINE HCL 2 % IJ SOLN
20.0000 mL | Freq: Once | INTRAMUSCULAR | Status: AC
  Administered 2019-08-29: 400 mg
  Filled 2019-08-28: qty 20

## 2019-08-28 MED ORDER — MORPHINE SULFATE (PF) 4 MG/ML IV SOLN
4.0000 mg | Freq: Once | INTRAVENOUS | Status: AC
  Administered 2019-08-29: 4 mg via INTRAVENOUS
  Filled 2019-08-28: qty 1

## 2019-08-28 MED ORDER — ONDANSETRON HCL 4 MG/2ML IJ SOLN
4.0000 mg | Freq: Once | INTRAMUSCULAR | Status: AC
  Administered 2019-08-29: 4 mg via INTRAVENOUS
  Filled 2019-08-28: qty 2

## 2019-08-28 NOTE — ED Triage Notes (Signed)
Pt arrived via [facility] following a fall resulting in deformity of left shoulder. Enroute pt given 100 mcg of fentanyl. PMS intact on left hand. VVS A+Ox4.

## 2019-08-29 ENCOUNTER — Emergency Department (HOSPITAL_COMMUNITY): Payer: 59

## 2019-08-29 MED ORDER — IBUPROFEN 600 MG PO TABS: 600.0000 mg | ORAL_TABLET | Freq: Four times a day (QID) | ORAL | 0 refills | Status: DC | PRN

## 2019-08-29 NOTE — Discharge Instructions (Addendum)
You were seen today and had a reduction of the left shoulder dislocation.  Keep your shoulder in a sling.  Follow-up with orthopedics.  You likely disrupted some of the rotator cuff ligaments in the shoulder during the injury.  Take ibuprofen as needed for pain.
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
## 2019-08-29 NOTE — ED Notes (Signed)
Pt verbalized understanding of d/c instructions, follow up care, s/s requiring return to ed and prescription. Pt had no additional questions at this time and was transported via wheelchair to exit.

## 2020-06-18 ENCOUNTER — Encounter (INDEPENDENT_AMBULATORY_CARE_PROVIDER_SITE_OTHER): Payer: Self-pay | Admitting: Primary Care

## 2020-06-18 ENCOUNTER — Ambulatory Visit (INDEPENDENT_AMBULATORY_CARE_PROVIDER_SITE_OTHER): Payer: 59 | Admitting: Primary Care

## 2020-06-18 ENCOUNTER — Other Ambulatory Visit: Payer: Self-pay

## 2020-06-18 VITALS — BP 131/86 | HR 84 | Temp 97.3°F | Ht 72.0 in | Wt 256.4 lb

## 2020-06-18 DIAGNOSIS — Z6834 Body mass index (BMI) 34.0-34.9, adult: Secondary | ICD-10-CM

## 2020-06-18 DIAGNOSIS — Z1211 Encounter for screening for malignant neoplasm of colon: Secondary | ICD-10-CM

## 2020-06-18 DIAGNOSIS — E6609 Other obesity due to excess calories: Secondary | ICD-10-CM

## 2020-06-18 DIAGNOSIS — Z720 Tobacco use: Secondary | ICD-10-CM

## 2020-06-18 DIAGNOSIS — Z7689 Persons encountering health services in other specified circumstances: Secondary | ICD-10-CM | POA: Diagnosis not present

## 2020-06-18 DIAGNOSIS — R351 Nocturia: Secondary | ICD-10-CM

## 2020-06-18 DIAGNOSIS — G8929 Other chronic pain: Secondary | ICD-10-CM

## 2020-06-18 DIAGNOSIS — M25572 Pain in left ankle and joints of left foot: Secondary | ICD-10-CM

## 2020-06-18 DIAGNOSIS — J441 Chronic obstructive pulmonary disease with (acute) exacerbation: Secondary | ICD-10-CM | POA: Diagnosis not present

## 2020-06-18 DIAGNOSIS — J45901 Unspecified asthma with (acute) exacerbation: Secondary | ICD-10-CM

## 2020-06-18 MED ORDER — ALBUTEROL SULFATE HFA 108 (90 BASE) MCG/ACT IN AERS: 2.0000 | INHALATION_SPRAY | Freq: Four times a day (QID) | RESPIRATORY_TRACT | 2 refills | Status: DC | PRN

## 2020-06-18 MED ORDER — FLOVENT HFA 44 MCG/ACT IN AERO: 2.0000 | INHALATION_SPRAY | Freq: Two times a day (BID) | RESPIRATORY_TRACT | 12 refills | Status: DC

## 2020-06-18 NOTE — Progress Notes (Signed)
Chronic ankle pain from injury  Has to walk with cane sometimes  Pt complains of heart burn for the last month and a half  Needs inhalers for COPD and asthma

## 2020-06-18 NOTE — Patient Instructions (Signed)

## 2020-06-18 NOTE — Progress Notes (Signed)
New Patient Office Visit  Subjective:  Patient ID: Anthony Finley, male    DOB: 07/17/1970  Age: 50 y.o. MRN: [id number]  CC:  Chief Complaint  Patient presents with  . New Patient (Initial Visit)    ED  . Benign Prostatic Hypertrophy  . COPD    And asthma     HPI Anthony Finley is a 50 year old obese male who presents for establishment of care and the management of COPD with asthma.  Also voices concerns with nocturia, ED and changes in urinary stream and 2013 broke left ankle c/o pain everyday and at night . Rates pain 8/10 constantly. Past Medical History:  Diagnosis Date  . Asthma    mostly as child-uses inh about q 3-4 months  . COPD (chronic obstructive pulmonary disease) ([place])   . Thrombocytopenia ([place])   . Tobacco abuse     Past Surgical History:  Procedure Laterality Date  . FRACTURE SURGERY     orif fx lt ankle age 45  . HAND SURGERY Left 08/2015  . ORIF ANKLE FRACTURE  02/22/2012   Procedure: OPEN REDUCTION INTERNAL FIXATION (ORIF) ANKLE FRACTURE;  Surgeon: Wylene Simmer, MD;  Location: [facility];  Service: Orthopedics;  Laterality: Left;  Open Reduction Internal Fixation Left Ankle Bimalleolar Fracture;   Open Reduction Internal Fixation of Syndesmosis, and Repair of Deltoid Ligament  . PERCUTANEOUS PINNING Left 08/11/2015   Procedure: PINNING OF LEFT 4TH METACARPAL FRACTURE;  Surgeon: Milly Jakob, MD;  Location: [facility];  Service: Orthopedics;  Laterality: Left;    No family history on file.  Social History   Socioeconomic History  . Marital status: Single    Spouse name: Not on file  . Number of children: Not on file  . Years of education: Not on file  . Highest education level: Not on file  Occupational History  . Not on file  Tobacco Use  . Smoking status: Current Every Day Smoker    Packs/day: 0.50    Types: Cigarettes  . Smokeless tobacco: Never Used  Vaping Use  . Vaping Use: Never used   Substance and Sexual Activity  . Alcohol use: Yes    Comment: occ  . Drug use: No  . Sexual activity: Yes  Other Topics Concern  . Not on file  Social History Narrative  . Not on file   Social Determinants of Health   Financial Resource Strain: Not on file  Food Insecurity: Not on file  Transportation Needs: Not on file  Physical Activity: Not on file  Stress: Not on file  Social Connections: Not on file  Intimate Partner Violence: Not on file    ROS Review of Systems Pertinent negative and positive noted in HPI  Objective:   Today's Vitals: BP 131/86 (BP Location: Right Arm, Patient Position: Sitting, Cuff Size: Large)   Pulse 84   Temp (!) 97.3 F (36.3 C) (Temporal)   Ht 6' (1.829 m)   Wt 256 lb 6.4 oz (116.3 kg)   SpO2 92%   BMI 34.77 kg/m   Physical Exam Vitals:   06/18/20 1337  BP: 131/86  Pulse: 84  Temp: (!) 97.3 F (36.3 C)  TempSrc: Temporal  SpO2: 92%  Weight: 256 lb 6.4 oz (116.3 kg)  Height: 6' (1.829 m)   General: Vital signs reviewed.  Patient is well-developed and well-nourished, obese male in no acute distress and cooperative with exam.  Head: Normocephalic and atraumatic. Eyes: EOMI, conjunctivae normal,  no scleral icterus.  Neck: Supple, trachea midline, normal ROM, no JVD, masses, thyromegaly, or carotid bruit present.  Cardiovascular: RRR, S1 normal, S2 normal, no murmurs, gallops, or rubs. Pulmonary/Chest: Clear to auscultation bilaterally, no wheezes, rales, or rhonchi. Abdominal: Soft, non-tender, non-distended, BS +, no masses, organomegaly, or guarding present.  Musculoskeletal: No joint deformities, erythema, or stiffness, ROM full and nontender. Extremities: No lower extremity edema bilaterally,  pulses symmetric and intact bilaterally. No cyanosis or clubbing.left foot  Supination Neurological: A&O x3, Strength is normal and symmetric bilaterally.  Skin: Warm, dry and intact. No rashes or erythema. Psychiatric: Normal mood and  affect. speech and behavior is normal. Cognition and memory are normal.   Assessment & Plan:   Outpatient Encounter Medications as of 06/18/2020  Medication Sig  . albuterol (VENTOLIN HFA) 108 (90 Base) MCG/ACT inhaler Inhale 2 puffs into the lungs every 6 (six) hours as needed for wheezing or shortness of breath.  . fluticasone (FLOVENT HFA) 44 MCG/ACT inhaler Inhale 2 puffs into the lungs in the morning and at bedtime.  . [DISCONTINUED] acetaminophen (TYLENOL) 500 MG tablet Take 1 tablet (500 mg total) by mouth every 6 (six) hours as needed. (Patient not taking: Reported on 08/23/2018)  . [DISCONTINUED] albuterol (PROVENTIL HFA;VENTOLIN HFA) 108 (90 Base) MCG/ACT inhaler Inhale 1-2 puffs into the lungs every 6 (six) hours as needed for wheezing or shortness of breath.  . [DISCONTINUED] benzonatate (TESSALON) 100 MG capsule Take 1 capsule (100 mg total) by mouth every 8 (eight) hours. (Patient not taking: Reported on 08/23/2018)  . [DISCONTINUED] EPINEPHrine 0.3 mg/0.3 mL IJ SOAJ injection Inject 0.3 mLs (0.3 mg total) into the muscle once as needed (anaphylaxis). (Patient not taking: Reported on 08/23/2018)  . [DISCONTINUED] hydrOXYzine (ATARAX/VISTARIL) 25 MG tablet Take 1 tablet (25 mg total) by mouth at bedtime as needed for anxiety. (Patient not taking: Reported on 08/23/2018)  . [DISCONTINUED] ibuprofen (ADVIL) 600 MG tablet Take 1 tablet (600 mg total) by mouth every 6 (six) hours as needed.  . [DISCONTINUED] predniSONE (DELTASONE) 20 MG tablet 2 tabs po daily x 4 days (Patient not taking: Reported on 08/29/2019)   No facility-administered encounter medications on file as of 06/18/2020.  Anthony Finley was seen today for new patient (initial visit), benign prostatic hypertrophy and copd.  Diagnoses and all orders for this visit:  Encounter to establish care Establish care with new provider   asthma with COPD with exacerbation [facility]) Has not seen a pulmonologist but asthma has been controlled on a  short beta agonist but will add a low-dose beta agonist to decrease the use of SBA. Explained that is a rescue inhaler and not to be used daily will reevaluate if not controlled on this regiment. May need to be referred to pulmonology  Class 1 obesity due to excess calories without serious comorbidity with body mass index (BMI) of 34.0 to 34.9 in adult Obesity is 30-39 indicating an excess in caloric intake or underlining conditions. This may lead to other co-morbidities. Can lead to elevation in blood pressure calls respiratory problems to become worse and risk for diabetes lifestyle modifications of diet and exercise may reduce obesity.  -     Lipid Panel  Tobacco abuse We will discuss at each visit cessation this is contraindication of asthma and COPD. Patient is aware of complications  Nocturia -     CBC with Differential -     CMP14+EGFR  Colon cancer screening -     Ambulatory referral to Gastroenterology  Chronic pain of left ankle -     AMB referral to orthopedics  Other orders -     albuterol (VENTOLIN HFA) 108 (90 Base) MCG/ACT inhaler; Inhale 2 puffs into the lungs every 6 (six) hours as needed for wheezing or shortness of breath. -     fluticasone (FLOVENT HFA) 44 MCG/ACT inhaler; Inhale 2 puffs into the lungs in the morning and at bedtime.    Follow-up: No follow-ups on file.   Kerin Perna, NP

## 2020-06-19 ENCOUNTER — Other Ambulatory Visit (INDEPENDENT_AMBULATORY_CARE_PROVIDER_SITE_OTHER): Payer: Self-pay | Admitting: Primary Care

## 2020-06-19 DIAGNOSIS — R972 Elevated prostate specific antigen [PSA]: Secondary | ICD-10-CM

## 2020-06-19 DIAGNOSIS — D696 Thrombocytopenia, unspecified: Secondary | ICD-10-CM

## 2020-06-19 LAB — CBC WITH DIFFERENTIAL/PLATELET
Basophils Absolute: 0 10*3/uL (ref 0.0–0.2)
Basos: 0 %
EOS (ABSOLUTE): 0.1 10*3/uL (ref 0.0–0.4)
Eos: 1 %
Hematocrit: 48.1 % (ref 37.5–51.0)
Hemoglobin: 16 g/dL (ref 13.0–17.7)
Immature Grans (Abs): 0.1 10*3/uL (ref 0.0–0.1)
Immature Granulocytes: 1 %
Lymphocytes Absolute: 1.6 10*3/uL (ref 0.7–3.1)
Lymphs: 19 %
MCH: 30.1 pg (ref 26.6–33.0)
MCHC: 33.3 g/dL (ref 31.5–35.7)
MCV: 91 fL (ref 79–97)
Monocytes Absolute: 0.6 10*3/uL (ref 0.1–0.9)
Monocytes: 8 %
Neutrophils Absolute: 6 10*3/uL (ref 1.4–7.0)
Neutrophils: 71 %
Platelets: 76 10*3/uL — CL (ref 150–450)
RBC: 5.31 x10E6/uL (ref 4.14–5.80)
RDW: 12.7 % (ref 11.6–15.4)
WBC: 8.4 10*3/uL (ref 3.4–10.8)

## 2020-06-19 LAB — CMP14+EGFR
ALT: 29 IU/L (ref 0–44)
AST: 21 IU/L (ref 0–40)
Albumin/Globulin Ratio: 1.9 (ref 1.2–2.2)
Albumin: 4.3 g/dL (ref 4.0–5.0)
Alkaline Phosphatase: 59 IU/L (ref 44–121)
BUN/Creatinine Ratio: 7 — ABNORMAL LOW (ref 9–20)
BUN: 8 mg/dL (ref 6–24)
Bilirubin Total: 0.4 mg/dL (ref 0.0–1.2)
CO2: 19 mmol/L — ABNORMAL LOW (ref 20–29)
Calcium: 9.2 mg/dL (ref 8.7–10.2)
Chloride: 102 mmol/L (ref 96–106)
Creatinine, Ser: 1.09 mg/dL (ref 0.76–1.27)
GFR calc Af Amer: 91 mL/min/{1.73_m2} (ref >59)
GFR calc non Af Amer: 79 mL/min/{1.73_m2} (ref >59)
Globulin, Total: 2.3 g/dL (ref 1.5–4.5)
Glucose: 198 mg/dL — ABNORMAL HIGH (ref 65–99)
Potassium: 4 mmol/L (ref 3.5–5.2)
Sodium: 139 mmol/L (ref 134–144)
Total Protein: 6.6 g/dL (ref 6.0–8.5)

## 2020-06-19 LAB — LIPID PANEL
Chol/HDL Ratio: 3.9 ratio (ref 0.0–5.0)
Cholesterol, Total: 211 mg/dL — ABNORMAL HIGH (ref 100–199)
HDL: 54 mg/dL (ref >39)
LDL Chol Calc (NIH): 141 mg/dL — ABNORMAL HIGH (ref 0–99)
Triglycerides: 88 mg/dL (ref 0–149)
VLDL Cholesterol Cal: 16 mg/dL (ref 5–40)

## 2020-06-19 LAB — PSA: Prostate Specific Ag, Serum: 7.5 ng/mL — ABNORMAL HIGH (ref 0.0–4.0)

## 2020-06-19 MED ORDER — TAMSULOSIN HCL 0.4 MG PO CAPS: 0.4000 mg | ORAL_CAPSULE | Freq: Every day | ORAL | 3 refills | Status: DC

## 2020-06-19 MED ORDER — ATORVASTATIN CALCIUM 40 MG PO TABS: 40.0000 mg | ORAL_TABLET | Freq: Every day | ORAL | 3 refills | Status: DC

## 2020-06-20 ENCOUNTER — Telehealth (INDEPENDENT_AMBULATORY_CARE_PROVIDER_SITE_OTHER): Payer: Self-pay

## 2020-06-20 NOTE — Telephone Encounter (Signed)
-----   Message from Kerin Perna, NP sent at 06/19/2020  2:25 PM EST ----- Elevated cholesterol this can lead to heart attack and stroke. To lower your number you can decrease your fatty foods, red meat, cheese, milk and increase fiber like whole grains and veggies. order ATORVASTATIN 40 MG NIGHTLY  Elevated PSA sent in Flomax take at bedtime and referred to urology

## 2020-06-20 NOTE — Telephone Encounter (Signed)
Patient verified date of birth. He is aware that his cholesterol is elevated and can lead to stroke and heart attack. Advised him to decrease his fatty food, red meat, cheese and milk. Also advised to increase whole grain and veggies. He is aware that atorvastatin has been sent to the pharmacy. Instructed to take at bedtime along with flomax for elevated PSA. Referral placed to urology. Urology will contact directly to schedule. He verbalized understanding. Nat Christen, CMA

## 2020-06-25 ENCOUNTER — Encounter: Payer: Self-pay | Admitting: Physician Assistant

## 2020-06-25 ENCOUNTER — Other Ambulatory Visit: Payer: Self-pay

## 2020-06-25 ENCOUNTER — Telehealth: Payer: Self-pay | Admitting: Physician Assistant

## 2020-06-25 ENCOUNTER — Ambulatory Visit (INDEPENDENT_AMBULATORY_CARE_PROVIDER_SITE_OTHER): Payer: 59 | Admitting: Physician Assistant

## 2020-06-25 ENCOUNTER — Ambulatory Visit (INDEPENDENT_AMBULATORY_CARE_PROVIDER_SITE_OTHER): Payer: 59

## 2020-06-25 DIAGNOSIS — M79605 Pain in left leg: Secondary | ICD-10-CM

## 2020-06-25 DIAGNOSIS — M25572 Pain in left ankle and joints of left foot: Secondary | ICD-10-CM | POA: Diagnosis not present

## 2020-06-25 MED ORDER — LIDOCAINE HCL 1 % IJ SOLN
1.0000 mL | INTRAMUSCULAR | Status: AC | PRN
  Administered 2020-06-25: 1 mL

## 2020-06-25 MED ORDER — METHYLPREDNISOLONE ACETATE 40 MG/ML IJ SUSP
40.0000 mg | INTRAMUSCULAR | Status: AC | PRN
Start: 2020-06-25 — End: 2020-06-25
  Administered 2020-06-25: 40 mg via INTRA_ARTICULAR

## 2020-06-25 NOTE — Addendum Note (Signed)
Addended by: Robyne Peers on: 06/25/2020 03:09 PM   Modules accepted: Orders

## 2020-06-25 NOTE — Progress Notes (Signed)
Office Visit Note   Patient: Anthony Finley           Date of Birth: [birth year]           MRN: [id number] Visit Date: 06/25/2020              Requested by: Kerin Perna, NP [id number],  [place] [id number] PCP: Patient, No Pcp Per   Assessment & Plan: Visit Diagnoses:  1. Pain in left ankle and joints of left foot   2. Pain in left leg     Plan: Plan will have him undergo an intra-articular injection of the left hip under ultrasound with Dr. Junius Roads.  Also will send him to physical therapy to work on gastrocsoleus stretching range of motion and strengthening of the left ankle.  He will obtain inserts for his shoes with good arch support.  Change his shoes out daily.  See him back in 4 weeks to see how he is doing overall.  Patient tolerated sinus Tarsi injection well today.  Follow-Up Instructions: Return in about 4 weeks (around 07/23/2020).   Orders:  Orders Placed This Encounter  Procedures  . Foot Inj  . Medium Joint Inj  . XR Ankle Complete Left  . XR Foot Complete Left  . XR HIP UNILAT W OR W/O PELVIS 1V LEFT   No orders of the defined types were placed in this encounter.     Procedures: Medium Joint Inj: L ankle on 06/25/2020 2:19 PM Details: lateral approach Medications: 1 mL lidocaine 1 %; 40 mg methylPREDNISolone acetate 40 MG/ML Consent was given by the patient. Immediately prior to procedure a time out was called to verify the correct patient, procedure, equipment, support staff and site/side marked as required. Patient was prepped and draped in the usual sterile fashion.       Clinical Data: No additional findings.   Subjective: Chief Complaint  Patient presents with  . Left Ankle - Pain    HPI   Anthony Finley comes in today with left ankle pain been ongoing for years.  He underwent lateral malleolus open reduction internal fixation, syndesmotic open reduction internal fixation and repair of deltoid ligament in 2013.  He states  that he was unable to do therapy due to the fact that he not have insurance at the time.  He has had decreased range of motion of the left ankle since that time.  He is having pain globally about the ankle and notes numbness tingling in his left great toe.  He walks with his foot externally rotated.  He has tried Tylenol and ibuprofen without any real relief.  He is having cramping in his foot and calf.  Patient notes that he is feels he is always externally rotated and is feet.  He notes that he does have left groin pain with prolonged ambulation.  Has difficulty donning shoes and socks on the left no problems on the right.  Review of Systems  Constitutional: Negative for chills and fever.  Musculoskeletal: Positive for arthralgias and gait problem.     Objective: Vital Signs: There were no vitals taken for this visit.  Physical Exam Constitutional:      Appearance: He is not ill-appearing or diaphoretic.  Cardiovascular:     Pulses: Normal pulses.  Pulmonary:     Effort: Pulmonary effort is normal.  Neurological:     Mental Status: He is alert.  Psychiatric:        Mood and Affect: Mood  normal.     Ortho Exam Right ankle full range of motion 5 out of 5 strength with inversion eversion against resistance.  Bilateral pes planus.  Is able do a single heel raise on the right on the left.  He is unable to invert or evert the left foot.  He has limited dorsiflexion plantarflexion of the left ankle.  Tenderness over the sinus Tarsi region and over the anterior medial aspect of the ankle.  Tenderness over the left Achilles.  Thompson's test is negative bilaterally.  Subjective decreased sensation over the most medial portion of the superficial peroneal nerve of the left foot otherwise sensation intact bilateral feet.  Calves are supple nontender bilaterally.  Lives with his left hip externally rotated.  Range of motion of the left hip attempts of internal rotation causes significant pain.  He  has full range of motion right hip without pain. Specialty Comments:  No specialty comments available.  Imaging: XR Ankle Complete Left  Result Date: 06/25/2020 Left ankle 3 views: Talus well located within the ankle mortise.  No diastases.  Status post ORIF and syndesmotic ORIF with out hardware failure.  Anchor is seen in the medial malleolus.  No acute fractures.  Lateral view shows posterior subtalar joint with early arthritic changes.  XR Foot Complete Left  Result Date: 06/25/2020 Left foot 3 views: No acute fractures.  Status post ORIF ankle fracture hardware seen.  No subluxation dislocations throughout the foot.  Sesamoids well located.  XR HIP UNILAT W OR W/O PELVIS 1V LEFT  Result Date: 06/25/2020 AP pelvis lateral view of left hip: Periarticular spurring.  Maben left femoral head compared to the right.  Joint space is slightly narrowed on the left compared to the right.  No acute fractures.  No bony abnormalities otherwise.    PMFS History: Patient Active Problem List   Diagnosis Date Noted  . Asthma with COPD with exacerbation ([place]) 10/18/2017  . Ischemic chest pain ([place]) 10/18/2017  . Tobacco abuse 10/18/2017  . Thrombocytopenia ([place]) 10/18/2017   Past Medical History:  Diagnosis Date  . Asthma    mostly as child-uses inh about q 3-4 months  . COPD (chronic obstructive pulmonary disease) ([place])   . Thrombocytopenia ([place])   . Tobacco abuse     History reviewed. No pertinent family history.  Past Surgical History:  Procedure Laterality Date  . FRACTURE SURGERY     orif fx lt ankle age 70  . HAND SURGERY Left 08/2015  . ORIF ANKLE FRACTURE  02/22/2012   Procedure: OPEN REDUCTION INTERNAL FIXATION (ORIF) ANKLE FRACTURE;  Surgeon: Wylene Simmer, MD;  Location: [facility];  Service: Orthopedics;  Laterality: Left;  Open Reduction Internal Fixation Left Ankle Bimalleolar Fracture;   Open Reduction Internal Fixation of Syndesmosis, and Repair of Deltoid  Ligament  . PERCUTANEOUS PINNING Left 08/11/2015   Procedure: PINNING OF LEFT 4TH METACARPAL FRACTURE;  Surgeon: Milly Jakob, MD;  Location: [facility];  Service: Orthopedics;  Laterality: Left;   Social History   Occupational History  . Not on file  Tobacco Use  . Smoking status: Current Every Day Smoker    Packs/day: 0.50    Types: Cigarettes  . Smokeless tobacco: Never Used  Vaping Use  . Vaping Use: Never used  Substance and Sexual Activity  . Alcohol use: Yes    Comment: occ  . Drug use: No  . Sexual activity: Yes

## 2020-06-25 NOTE — Telephone Encounter (Signed)
Received a new hem referral from Juluis Mire, NP for thrombocytopenia. Mr. Anthony Finley has been cld and scheduled to see Cassie on 3/3 at 1:30pm w/labs at 1pm. Pt aware to arrive 20 minutes early.

## 2020-06-27 ENCOUNTER — Encounter: Payer: Self-pay | Admitting: Gastroenterology

## 2020-07-01 ENCOUNTER — Encounter: Payer: Self-pay | Admitting: Family Medicine

## 2020-07-01 ENCOUNTER — Ambulatory Visit (INDEPENDENT_AMBULATORY_CARE_PROVIDER_SITE_OTHER): Payer: 59 | Admitting: Family Medicine

## 2020-07-01 ENCOUNTER — Other Ambulatory Visit: Payer: Self-pay | Admitting: Medical Oncology

## 2020-07-01 ENCOUNTER — Other Ambulatory Visit: Payer: Self-pay

## 2020-07-01 ENCOUNTER — Ambulatory Visit: Payer: Self-pay

## 2020-07-01 ENCOUNTER — Telehealth: Payer: Self-pay | Admitting: Hematology and Oncology

## 2020-07-01 DIAGNOSIS — M79605 Pain in left leg: Secondary | ICD-10-CM | POA: Diagnosis not present

## 2020-07-01 NOTE — Telephone Encounter (Signed)
Anthony Finley cld to reschedule his new hem appt to see Dr. Chryl Heck on 3/15 at 1pm. Pt aware to arrive 20 minutes early

## 2020-07-01 NOTE — Progress Notes (Signed)
Subjective: Patient is here for ultrasound-guided intra-articular left hip injection.   He primarily has left ankle pain, but has not gotten much improvement with ankle injection.  Examination revealed hip pain with internal rotation, so x-rays were obtained showing degenerative changes.  He is referred for a diagnostic injection.  Objective: He does have pain and limited motion with passive internal left hip rotation.  Procedure: Ultrasound guided injection is preferred based studies that show increased duration, increased effect, greater accuracy, decreased procedural pain, increased response rate, and decreased cost with ultrasound guided versus blind injection.   Verbal informed consent obtained.  Time-out conducted.  Noted no overlying erythema, induration, or other signs of local infection. Ultrasound-guided left hip injection: After sterile prep with Betadine, injected 4 cc 0.25% bupivacaine without epinephrine and 6 mg betamethasone using a 22-gauge spinal needle, passing the needle through the iliofemoral ligament into the femoral head/neck junction.  Injectate was seen filling the joint capsule.  He did not have much immediate change in symptoms.  He will report to [place] next week.

## 2020-07-03 ENCOUNTER — Encounter: Payer: 59 | Admitting: Physician Assistant

## 2020-07-03 ENCOUNTER — Other Ambulatory Visit: Payer: 59

## 2020-07-07 ENCOUNTER — Ambulatory Visit: Payer: 59 | Admitting: Physical Therapy

## 2020-07-10 ENCOUNTER — Other Ambulatory Visit: Payer: Self-pay

## 2020-07-10 ENCOUNTER — Encounter: Payer: Self-pay | Admitting: Rehabilitative and Restorative Service Providers"

## 2020-07-10 ENCOUNTER — Ambulatory Visit (INDEPENDENT_AMBULATORY_CARE_PROVIDER_SITE_OTHER): Payer: 59 | Admitting: Rehabilitative and Restorative Service Providers"

## 2020-07-10 DIAGNOSIS — M6281 Muscle weakness (generalized): Secondary | ICD-10-CM | POA: Diagnosis not present

## 2020-07-10 DIAGNOSIS — R262 Difficulty in walking, not elsewhere classified: Secondary | ICD-10-CM

## 2020-07-10 DIAGNOSIS — R600 Localized edema: Secondary | ICD-10-CM

## 2020-07-10 DIAGNOSIS — M25672 Stiffness of left ankle, not elsewhere classified: Secondary | ICD-10-CM

## 2020-07-10 DIAGNOSIS — M25572 Pain in left ankle and joints of left foot: Secondary | ICD-10-CM

## 2020-07-10 NOTE — Therapy (Addendum)
[facility] [address] [place], [place], [address] Phone: [phone]   Fax:  [phone]  Physical Therapy Evaluation  Patient Details  Name: Anthony Finley MRN: [id number] Date of Birth: 10/22/1970 Referring Provider (PT): Pete Pelt PA-C  PHYSICAL THERAPY DISCHARGE SUMMARY  Visits from Start of Care: 1  Current functional level related to goals / functional outcomes: No appointments attended after evaluation (3 consecutive no shows)   Remaining deficits: See note   Education / Equipment: HEP  Plan:                                                    Patient goals were not met. Patient is being discharged due to not returning since the last visit.  ?????     Encounter Date: 07/10/2020   PT End of Session - 07/10/20 1625    Visit Number 1    Number of Visits 12    Date for PT Re-Evaluation 09/04/20    PT Start Time 0940    PT Stop Time 1418    PT Time Calculation (min) 35 min    Activity Tolerance Patient tolerated treatment well;No increased pain    Behavior During Therapy WFL for tasks assessed/performed           Past Medical History:  Diagnosis Date  . Asthma    mostly as child-uses inh about q 3-4 months  . COPD (chronic obstructive pulmonary disease) ([place])   . Thrombocytopenia ([place])   . Tobacco abuse     Past Surgical History:  Procedure Laterality Date  . FRACTURE SURGERY     orif fx lt ankle age 55  . HAND SURGERY Left 08/2015  . ORIF ANKLE FRACTURE  02/22/2012   Procedure: OPEN REDUCTION INTERNAL FIXATION (ORIF) ANKLE FRACTURE;  Surgeon: Wylene Simmer, MD;  Location: [place];  Service: Orthopedics;  Laterality: Left;  Open Reduction Internal Fixation Left Ankle Bimalleolar Fracture;   Open Reduction Internal Fixation of Syndesmosis, and Repair of Deltoid Ligament  . PERCUTANEOUS PINNING Left 08/11/2015   Procedure: PINNING OF LEFT 4TH METACARPAL FRACTURE;  Surgeon: Milly Jakob, MD;   Location: [place];  Service: Orthopedics;  Laterality: Left;    There were no vitals filed for this visit.    Subjective Assessment - 07/10/20 1427    Subjective Cauy states his L ankle has not been right since a fracture and ORIF in October of 2013.  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    Patient is accompained by: Family member    Pertinent History L ankle ORIF in October 2013; smoker; COPD    Limitations House hold activities;Lifting;Standing;Walking    How long can you stand comfortably? Not at all comfortably    How long can you walk comfortably? Not comfortably    Patient Stated Goals Be able to stand and walk without pain and return to normal job duties without restriction    Currently in Pain? Yes    Pain Score 6     Pain Location Ankle    Pain Orientation Left    Pain Descriptors / Indicators Aching;Constant;Sharp;Stabbing;Tightness;Throbbing    Pain Type Chronic pain    Pain Onset More than a month ago  Pain Frequency Constant    Aggravating Factors  Weight-bearing is far worse    Pain Relieving Factors Getting off his feet    Effect of Pain on Daily Activities Not able to do normal job duties as a Games developer    Multiple Pain Sites No              [facility] PT Assessment - 07/10/20 0001      Assessment   Medical Diagnosis L foot and ankle pain    Referring Provider (PT) Pete Pelt PA-C    Onset Date/Surgical Date --   L ankle ORIF 2013     Balance Screen   Has the patient fallen in the past 6 months No    Has the patient had a decrease in activity level because of a fear of falling?  No    Is the patient reluctant to leave their home because of a fear of falling?  No      Prior Function   Level of Independence Independent      Cognition   Overall Cognitive Status Within Functional Limits for tasks assessed      Observation/Other Assessments    Focus on Therapeutic Outcomes (FOTO)  36 (Goal 61)      ROM / Strength   AROM / PROM / Strength AROM;Strength      AROM   Overall AROM  Deficits    AROM Assessment Site Ankle    Right/Left Ankle Left;Right    Right Ankle Dorsiflexion 7    Left Ankle Dorsiflexion -7      Strength   Overall Strength Deficits    Strength Assessment Site Ankle    Right/Left Ankle Left;Right    Right Ankle Inversion 5/5    Right Ankle Eversion 5/5    Left Ankle Inversion 3+/5    Left Ankle Eversion 3+/5      Ambulation/Gait   Gait Comments Anthony Finley is walking with a noticeable limp without an assistive device.  He notes pain immediately upon WB.                      Objective measurements completed on examination: See above findings.       [facility] Adult PT Treatment/Exercise - 07/10/20 0001      Exercises   Exercises Ankle      Ankle Exercises: Stretches   Gastroc Stretch 5 reps;20 seconds;Other (comment)   Bilateral with slight toe in   Slant Board Stretch 1 rep;60 seconds;Other (comment)   Slight toe in with knees straight     Ankle Exercises: Standing   Heel Raises Both;10 reps;3 seconds;Other (comment)   slow eccentrics                 PT Education - 07/10/20 1625    Education Details Discussed exam findings and reviewed starter HEP.    Person(s) Educated Patient;Spouse    Methods Explanation;Demonstration;Tactile cues;Verbal cues;Handout    Comprehension Verbal cues required;Need further instruction;Returned demonstration;Verbalized understanding;Tactile cues required            PT Short Term Goals - 07/10/20 1636      PT SHORT TERM GOAL #1   Title Anthony Finley will be able to dorsiflex to neutral (o degrees) with his L ankle.    Baseline -7 degrees    Time 4    Period Weeks    Status New    Target Date 08/07/20  PT Long Term Goals - 07/10/20 1637      PT LONG TERM GOAL #1   Title Anthony Finley will improve his FOTO score to 61.    Baseline  36    Time 8    Period Weeks    Status New    Target Date 09/05/20      PT LONG TERM GOAL #2   Title Improve L ankle dorsiflexion AROM to 10 degrees.    Baseline -7 degrees    Time 8    Period Weeks    Status New    Target Date 09/05/20      PT LONG TERM GOAL #3   Title Improve L ankle strength to at least 4+/5 for plantar flexors.    Baseline Unable to do a single leg heel raise.    Time 8    Period Weeks    Status New    Target Date 09/05/20      PT LONG TERM GOAL #4   Title Anthony Finley will be able to stand and walk for at least an hour uninterrupted at DC.    Baseline Painful with any WB and not working normal job duties.    Time 8    Period Weeks    Status New    Target Date 09/05/20                  Plan - 07/10/20 1429    Clinical Impression Statement Anthony Finley reports a previous L ankle ORIF in October 2013.  He reports his ankle hasn't felt right since the fracture but he was able to work as a Games developer until recently where he has not been able to do his normal job duties.  Weight-bearing is very limited and he would like to be able to walk and stand without pain for return to work and other normal ADLs.  Dorsiflexion AROM is very limited as is ankle strength.  Progress in these areas should significantly improve WB function.    Personal Factors and Comorbidities Comorbidity 1;Fitness;Time since onset of injury/illness/exacerbation    Comorbidities Previous ORIF 10/13, COPD, smoker    Examination-Activity Limitations Squat;Locomotion Level;Stairs;Carry;Stand    Examination-Participation Restrictions Interpersonal Relationship;Occupation;Community Activity    Stability/Clinical Decision Making Stable/Uncomplicated    Clinical Decision Making Low    Rehab Potential Good    PT Frequency Other (comment)   1-2X/week   PT Duration 8 weeks    PT Treatment/Interventions ADLs/Self Care Home Management;Cryotherapy;Therapeutic activities;Stair training;Gait  training;Therapeutic exercise;Balance training;Neuromuscular re-education;Patient/family education;Manual techniques;Dry needling;Vasopneumatic Device    PT Next Visit Plan Improve dorsiflexion AROM, ankle strength and balance as able    PT Home Exercise Plan Access Code: [email or web address]    Consulted and Agree with Plan of Care Patient;Family member/caregiver    Family Member Consulted Wife           Patient will benefit from skilled therapeutic intervention in order to improve the following deficits and impairments:  Abnormal gait,Decreased activity tolerance,Decreased endurance,Decreased range of motion,Decreased strength,Difficulty walking,Increased edema,Impaired flexibility,Pain,Obesity  Visit Diagnosis: Difficulty in walking, not elsewhere classified  Muscle weakness (generalized)  Localized edema  Stiffness of left ankle, not elsewhere classified  Pain in left ankle and joints of left foot     Problem List Patient Active Problem List   Diagnosis Date Noted  . Asthma with COPD with exacerbation ([place]) 10/18/2017  . Ischemic chest pain ([place]) 10/18/2017  . Tobacco abuse 10/18/2017  . Thrombocytopenia ([place]) 10/18/2017    Anthony Finley 07/10/2020,  [facility] [address] [place], [place], [address] Phone: [phone]   Fax:  [phone]  Name: Anthony Finley MRN: [id number] Date of Birth: [birth year]

## 2020-07-10 NOTE — Patient Instructions (Signed)
Access Code: [email or web address] URL: [email or web address] Date: 07/10/2020 Prepared by: Vista Mink  Exercises Standing Gastroc Stretch - 3-5 x daily - 7 x weekly - 1 sets - 5 reps - 20 seconds hold Heel Raise - 5 x daily - 7 x weekly - 1 sets - 10 reps - 3 seconds hold

## 2020-07-15 ENCOUNTER — Inpatient Hospital Stay: Payer: 59 | Admitting: Hematology and Oncology

## 2020-07-15 ENCOUNTER — Inpatient Hospital Stay: Payer: 59 | Attending: Physician Assistant

## 2020-07-15 NOTE — Progress Notes (Deleted)
[name] CONSULT NOTE  Patient Care Team: Patient, No Pcp Per as PCP - General (General Practice)  CHIEF COMPLAINTS/PURPOSE OF CONSULTATION:  ***  ASSESSMENT & PLAN:  No problem-specific Assessment & Plan notes found for this encounter.  No orders of the defined types were placed in this encounter.    HISTORY OF PRESENTING ILLNESS:  Anthony Finley 50 y.o. male is here because of thrombocytopenia.  REVIEW OF SYSTEMS:   Constitutional: Denies fevers, chills or abnormal night sweats Eyes: Denies blurriness of vision, double vision or watery eyes Ears, nose, mouth, throat, and face: Denies mucositis or sore throat Respiratory: Denies cough, dyspnea or wheezes Cardiovascular: Denies palpitation, chest discomfort or lower extremity swelling Gastrointestinal:  Denies nausea, heartburn or change in bowel habits Skin: Denies abnormal skin rashes Lymphatics: Denies new lymphadenopathy or easy bruising Neurological:Denies numbness, tingling or new weaknesses Behavioral/Psych: Mood is stable, no new changes  All other systems were reviewed with the patient and are negative.  MEDICAL HISTORY:  Past Medical History:  Diagnosis Date  . Asthma    mostly as child-uses inh about q 3-4 months  . COPD (chronic obstructive pulmonary disease) ([place])   . Thrombocytopenia ([place])   . Tobacco abuse     SURGICAL HISTORY: Past Surgical History:  Procedure Laterality Date  . FRACTURE SURGERY     orif fx lt ankle age 25  . HAND SURGERY Left 08/2015  . ORIF ANKLE FRACTURE  02/22/2012   Procedure: OPEN REDUCTION INTERNAL FIXATION (ORIF) ANKLE FRACTURE;  Surgeon: Wylene Simmer, MD;  Location: [facility];  Service: Orthopedics;  Laterality: Left;  Open Reduction Internal Fixation Left Ankle Bimalleolar Fracture;   Open Reduction Internal Fixation of Syndesmosis, and Repair of Deltoid Ligament  . PERCUTANEOUS PINNING Left 08/11/2015   Procedure: PINNING OF LEFT 4TH  METACARPAL FRACTURE;  Surgeon: Milly Jakob, MD;  Location: [facility];  Service: Orthopedics;  Laterality: Left;    SOCIAL HISTORY: Social History   Socioeconomic History  . Marital status: Single    Spouse name: Not on file  . Number of children: Not on file  . Years of education: Not on file  . Highest education level: Not on file  Occupational History  . Not on file  Tobacco Use  . Smoking status: Current Every Day Smoker    Packs/day: 0.50    Types: Cigarettes  . Smokeless tobacco: Never Used  Vaping Use  . Vaping Use: Never used  Substance and Sexual Activity  . Alcohol use: Yes    Comment: occ  . Drug use: No  . Sexual activity: Yes  Other Topics Concern  . Not on file  Social History Narrative  . Not on file   Social Determinants of Health   Financial Resource Strain: Not on file  Food Insecurity: Not on file  Transportation Needs: Not on file  Physical Activity: Not on file  Stress: Not on file  Social Connections: Not on file  Intimate Partner Violence: Not on file    FAMILY HISTORY: No family history on file.  ALLERGIES:  is allergic to cephalosporins, fish-derived products, keflex [cephalexin], and penicillins.  MEDICATIONS:  Current Outpatient Medications  Medication Sig Dispense Refill  . albuterol (VENTOLIN HFA) 108 (90 Base) MCG/ACT inhaler Inhale 2 puffs into the lungs every 6 (six) hours as needed for wheezing or shortness of breath. 8 g 2  . atorvastatin (LIPITOR) 40 MG tablet Take 1 tablet (40 mg total) by mouth daily.  90 tablet 3  . fluticasone (FLOVENT HFA) 44 MCG/ACT inhaler Inhale 2 puffs into the lungs in the morning and at bedtime. 1 each 12  . tamsulosin (FLOMAX) 0.4 MG CAPS capsule Take 1 capsule (0.4 mg total) by mouth daily. 30 capsule 3   No current facility-administered medications for this visit.     PHYSICAL EXAMINATION: ECOG PERFORMANCE STATUS: {CHL ONC ECOG PS:[phone]}  There were no vitals filed for  this visit. There were no vitals filed for this visit.  GENERAL:alert, no distress and comfortable SKIN: skin color, texture, turgor are normal, no rashes or significant lesions EYES: normal, conjunctiva are pink and non-injected, sclera clear OROPHARYNX:no exudate, no erythema and lips, buccal mucosa, and tongue normal  NECK: supple, thyroid normal size, non-tender, without nodularity LYMPH:  no palpable lymphadenopathy in the cervical, axillary or inguinal LUNGS: clear to auscultation and percussion with normal breathing effort HEART: regular rate & rhythm and no murmurs and no lower extremity edema ABDOMEN:abdomen soft, non-tender and normal bowel sounds Musculoskeletal:no cyanosis of digits and no clubbing  PSYCH: alert & oriented x 3 with fluent speech NEURO: no focal motor/sensory deficits  LABORATORY DATA:  I have reviewed the data as listed Lab Results  Component Value Date   WBC 8.4 06/18/2020   HGB 16.0 06/18/2020   HCT 48.1 06/18/2020   MCV 91 06/18/2020   PLT 76 (LL) 06/18/2020     Chemistry      Component Value Date/Time   NA 139 06/18/2020 1429   K 4.0 06/18/2020 1429   CL 102 06/18/2020 1429   CO2 19 (L) 06/18/2020 1429   BUN 8 06/18/2020 1429   CREATININE 1.09 06/18/2020 1429      Component Value Date/Time   CALCIUM 9.2 06/18/2020 1429   ALKPHOS 59 06/18/2020 1429   AST 21 06/18/2020 1429   ALT 29 06/18/2020 1429   BILITOT 0.4 06/18/2020 1429       RADIOGRAPHIC STUDIES: I have personally reviewed the radiological images as listed and agreed with the findings in the report. US Guided Needle Placement - No Linked Charges  Result Date: 07/01/2020 Ultrasound guided injection is preferred based studies that show increased duration, increased effect, greater accuracy, decreased procedural pain, increased response rate, and decreased cost with ultrasound guided versus blind injection.   Verbal informed consent obtained.  Time-out conducted.  Noted no  overlying erythema, induration, or other signs of local infection. Ultrasound-guided left hip injection: After sterile prep with Betadine, injected 4 cc 0.25% bupivacaine without epinephrine and 6 mg betamethasone using a 22-gauge spinal needle, passing the needle through the iliofemoral ligament into the femoral head/neck junction.  Injectate was seen filling the joint capsule.  He did not have much immediate change in symptoms.  He will report to [place] next week.  XR Ankle Complete Left  Result Date: 06/25/2020 Left ankle 3 views: Talus well located within the ankle mortise.  No diastases.  Status post ORIF and syndesmotic ORIF with out hardware failure.  Anchor is seen in the medial malleolus.  No acute fractures.  Lateral view shows posterior subtalar joint with early arthritic changes.  XR Foot Complete Left  Result Date: 06/25/2020 Left foot 3 views: No acute fractures.  Status post ORIF ankle fracture hardware seen.  No subluxation dislocations throughout the foot.  Sesamoids well located.  XR HIP UNILAT W OR W/O PELVIS 1V LEFT  Result Date: 06/25/2020 AP pelvis lateral view of left hip: Periarticular spurring.  Terminous left femoral head compared  to the right.  Joint space is slightly narrowed on the left compared to the right.  No acute fractures.  No bony abnormalities otherwise.   All questions were answered. The patient knows to call the clinic with any problems, questions or concerns. I spent *** minutes in the care of this patient including H and P, review of records, counseling and coordination of care.     Benay Pike, MD 07/15/2020 1:10 PM

## 2020-07-16 ENCOUNTER — Encounter: Payer: 59 | Admitting: Physical Therapy

## 2020-07-16 ENCOUNTER — Telehealth: Payer: Self-pay | Admitting: Physical Therapy

## 2020-07-16 NOTE — Telephone Encounter (Signed)
Pt forgot about PT appt today.  Reminded of next scheduled appt.  Laureen Abrahams, PT, DPT 07/16/20 2:04 PM

## 2020-07-23 ENCOUNTER — Ambulatory Visit: Payer: 59 | Admitting: Physician Assistant

## 2020-07-31 ENCOUNTER — Encounter: Payer: 59 | Admitting: Rehabilitative and Restorative Service Providers"

## 2020-07-31 ENCOUNTER — Telehealth: Payer: Self-pay | Admitting: Rehabilitative and Restorative Service Providers"

## 2020-07-31 ENCOUNTER — Ambulatory Visit: Payer: 59 | Admitting: Physician Assistant

## 2020-07-31 NOTE — Telephone Encounter (Signed)
Anthony Finley got confused on his dates.  Confirmed 4/7 appointment at 1:45.

## 2020-08-07 ENCOUNTER — Encounter: Payer: 59 | Admitting: Rehabilitative and Restorative Service Providers"

## 2020-08-07 ENCOUNTER — Telehealth: Payer: Self-pay | Admitting: Rehabilitative and Restorative Service Providers"

## 2020-08-07 NOTE — Telephone Encounter (Signed)
2nd consecutive no show.  No voicemail.  No additional appointments.

## 2020-08-20 ENCOUNTER — Ambulatory Visit (AMBULATORY_SURGERY_CENTER): Payer: Self-pay

## 2020-08-20 ENCOUNTER — Other Ambulatory Visit: Payer: Self-pay

## 2020-08-20 VITALS — Ht 72.0 in | Wt 245.0 lb

## 2020-08-20 DIAGNOSIS — Z1211 Encounter for screening for malignant neoplasm of colon: Secondary | ICD-10-CM

## 2020-08-20 MED ORDER — NA SULFATE-K SULFATE-MG SULF 17.5-3.13-1.6 GM/177ML PO SOLN: 1.0000 | Freq: Once | ORAL | 0 refills | Status: AC

## 2020-08-20 NOTE — Progress Notes (Signed)
No allergies to soy or egg Pt is not on blood thinners or diet pills Denies issues with sedation/intubation Denies atrial flutter/fib Denies constipation   Pt is aware of Covid safety and care partner requirements.  Pt instructed to bring inhalers with him the day of the procedure.

## 2020-09-03 ENCOUNTER — Other Ambulatory Visit: Payer: Self-pay

## 2020-09-03 ENCOUNTER — Encounter: Payer: Self-pay | Admitting: Gastroenterology

## 2020-09-03 ENCOUNTER — Ambulatory Visit (AMBULATORY_SURGERY_CENTER): Payer: 59 | Admitting: Gastroenterology

## 2020-09-03 VITALS — BP 124/87 | HR 78 | Temp 98.2°F | Resp 12 | Ht 72.0 in | Wt 245.0 lb

## 2020-09-03 DIAGNOSIS — Z1211 Encounter for screening for malignant neoplasm of colon: Secondary | ICD-10-CM | POA: Diagnosis not present

## 2020-09-03 DIAGNOSIS — D123 Benign neoplasm of transverse colon: Secondary | ICD-10-CM

## 2020-09-03 DIAGNOSIS — D124 Benign neoplasm of descending colon: Secondary | ICD-10-CM | POA: Diagnosis not present

## 2020-09-03 DIAGNOSIS — D122 Benign neoplasm of ascending colon: Secondary | ICD-10-CM

## 2020-09-03 MED ORDER — SODIUM CHLORIDE 0.9 % IV SOLN: 500.0000 mL | INTRAVENOUS | Status: DC

## 2020-09-03 NOTE — Op Note (Signed)
[facility] Patient Name: Anthony Finley Procedure Date: 09/03/2020 10:49 AM MRN: [id number] Endoscopist: Milus Banister , MD Age: 50 Referring MD:  Date of Birth: [birth year] Gender: Male Account #: [id number] Procedure:                Colonoscopy Indications:              Screening for colorectal malignant neoplasm Medicines:                Monitored Anesthesia Care Procedure:                Pre-Anesthesia Assessment:                           - Prior to the procedure, a History and Physical                            was performed, and patient medications and                            allergies were reviewed. The patient's tolerance of                            previous anesthesia was also reviewed. The risks                            and benefits of the procedure and the sedation                            options and risks were discussed with the patient.                            All questions were answered, and informed consent                            was obtained. Prior Anticoagulants: The patient has                            taken no previous anticoagulant or antiplatelet                            agents. ASA Grade Assessment: III - A patient with                            severe systemic disease. After reviewing the risks                            and benefits, the patient was deemed in                            satisfactory condition to undergo the procedure.                           After obtaining informed consent, the colonoscope  was passed under direct vision. Throughout the                            procedure, the patient's blood pressure, pulse, and                            oxygen saturations were monitored continuously. The                            Olympus [id number] was introduced                            through the anus and advanced to the the cecum,                            identified  by appendiceal orifice and ileocecal                            valve. The colonoscopy was performed without                            difficulty. The patient tolerated the procedure                            well. The quality of the bowel preparation was                            good. The ileocecal valve, appendiceal orifice, and                            rectum were photographed. Scope In: 10:52:34 AM Scope Out: 11:05:59 AM Scope Withdrawal Time: 0 hours 11 minutes 36 seconds  Total Procedure Duration: 0 hours 13 minutes 25 seconds  Findings:                 Four sessile polyps were found in the descending                            colon, transverse colon and ascending colon. The                            polyps were 3 to 5 mm in size. These polyps were                            removed with a cold snare. Resection and retrieval                            were complete.                           A 13 mm polyp was found in the descending colon.                            The polyp was semi-pedunculated. The polyp  was                            removed with a hot snare. Resection and retrieval                            were complete.                           The exam was otherwise without abnormality on                            direct and retroflexion views. Complications:            No immediate complications. Estimated blood loss:                            None. Estimated Blood Loss:     Estimated blood loss: none. Impression:               - Four 3 to 5 mm polyps in the descending colon, in                            the transverse colon and in the ascending colon,                            removed with a cold snare. Resected and retrieved.                           - One 13 mm polyp in the descending colon, removed                            with a hot snare. Resected and retrieved.                           - The examination was otherwise normal on direct                             and retroflexion views. Recommendation:           - Patient has a contact number available for                            emergencies. The signs and symptoms of potential                            delayed complications were discussed with the                            patient. Return to normal activities tomorrow.                            Written discharge instructions were provided to the                            patient.                           -  Resume previous diet.                           - Continue present medications.                           - Await pathology results. Milus Banister, MD 09/03/2020 11:09:04 AM This report has been signed electronically.

## 2020-09-03 NOTE — Progress Notes (Signed)
He has chronically low plts. He is not sure why. Last check was 70, 06/2020.  He has no unusual bleeding.  He understands he is probably at increased risk for bleeding complications due to his low plts, agreed to proceed.

## 2020-09-03 NOTE — Patient Instructions (Signed)
Please read handouts provided. Continue present medications. Await pathology results.   YOU HAD AN ENDOSCOPIC PROCEDURE TODAY AT THE  ENDOSCOPY CENTER:   Refer to the procedure report that was given to you for any specific questions about what was found during the examination.  If the procedure report does not answer your questions, please call your gastroenterologist to clarify.  If you requested that your care partner not be given the details of your procedure findings, then the procedure report has been included in a sealed envelope for you to review at your convenience later.  YOU SHOULD EXPECT: Some feelings of bloating in the abdomen. Passage of more gas than usual.  Walking can help get rid of the air that was put into your GI tract during the procedure and reduce the bloating. If you had a lower endoscopy (such as a colonoscopy or flexible sigmoidoscopy) you may notice spotting of blood in your stool or on the toilet paper. If you underwent a bowel prep for your procedure, you may not have a normal bowel movement for a few days.  Please Note:  You might notice some irritation and congestion in your nose or some drainage.  This is from the oxygen used during your procedure.  There is no need for concern and it should clear up in a day or so.  SYMPTOMS TO REPORT IMMEDIATELY:  Following lower endoscopy (colonoscopy or flexible sigmoidoscopy):  Excessive amounts of blood in the stool  Significant tenderness or worsening of abdominal pains  Swelling of the abdomen that is new, acute   For urgent or emergent issues, a gastroenterologist can be reached at any hour by calling [phone]. Do not use MyChart messaging for urgent concerns.    DIET:  We do recommend a small meal at first, but then you may proceed to your regular diet.  Drink plenty of fluids but you should avoid alcoholic beverages for 24 hours.  ACTIVITY:  You should plan to take it easy for the rest of today and you  should NOT DRIVE or use heavy machinery until tomorrow (because of the sedation medicines used during the test).    FOLLOW UP: Our staff will call the number listed on your records 48-72 hours following your procedure to check on you and address any questions or concerns that you may have regarding the information given to you following your procedure. If we do not reach you, we will leave a message.  We will attempt to reach you two times.  During this call, we will ask if you have developed any symptoms of COVID 19. If you develop any symptoms (ie: fever, flu-like symptoms, shortness of breath, cough etc.) before then, please call [phone].  If you test positive for Covid 19 in the 2 weeks post procedure, please call and report this information to us.    If any biopsies were taken you will be contacted by phone or by letter within the next 1-3 weeks.  Please call us at [phone] if you have not heard about the biopsies in 3 weeks.    SIGNATURES/CONFIDENTIALITY: You and/or your care partner have signed paperwork which will be entered into your electronic medical record.  These signatures attest to the fact that that the information above on your After Visit Summary has been reviewed and is understood.  Full responsibility of the confidentiality of this discharge information lies with you and/or your care-partner.  

## 2020-09-03 NOTE — Progress Notes (Signed)
Pt's states no medical or surgical changes since previsit or office visit. 

## 2020-09-03 NOTE — Progress Notes (Signed)
Report given to PACU, vss 

## 2020-09-03 NOTE — Progress Notes (Signed)
Called to room to assist during endoscopic procedure.  Patient ID and intended procedure confirmed with present staff. Received instructions for my participation in the procedure from the performing physician.  

## 2020-09-05 ENCOUNTER — Telehealth: Payer: Self-pay

## 2020-09-05 NOTE — Telephone Encounter (Signed)
LVM

## 2020-09-12 ENCOUNTER — Encounter: Payer: Self-pay | Admitting: Gastroenterology

## 2021-02-16 ENCOUNTER — Other Ambulatory Visit: Payer: Self-pay

## 2021-02-16 ENCOUNTER — Ambulatory Visit (HOSPITAL_COMMUNITY)
Admission: EM | Admit: 2021-02-16 | Discharge: 2021-02-16 | Disposition: A | Discharge status: Home / Self Care | Payer: 59 | Attending: Physician Assistant | Admitting: Physician Assistant

## 2021-02-16 ENCOUNTER — Encounter (HOSPITAL_COMMUNITY): Payer: Self-pay | Admitting: Emergency Medicine

## 2021-02-16 ENCOUNTER — Ambulatory Visit (INDEPENDENT_AMBULATORY_CARE_PROVIDER_SITE_OTHER): Payer: Self-pay

## 2021-02-16 DIAGNOSIS — J4521 Mild intermittent asthma with (acute) exacerbation: Secondary | ICD-10-CM | POA: Diagnosis not present

## 2021-02-16 DIAGNOSIS — J069 Acute upper respiratory infection, unspecified: Secondary | ICD-10-CM | POA: Insufficient documentation

## 2021-02-16 DIAGNOSIS — Z20822 Contact with and (suspected) exposure to covid-19: Secondary | ICD-10-CM | POA: Insufficient documentation

## 2021-02-16 LAB — POC INFLUENZA A AND B ANTIGEN (URGENT CARE ONLY)
INFLUENZA A ANTIGEN, POC: NEGATIVE
INFLUENZA B ANTIGEN, POC: NEGATIVE

## 2021-02-16 MED ORDER — ALBUTEROL SULFATE HFA 108 (90 BASE) MCG/ACT IN AERS
4.0000 | INHALATION_SPRAY | Freq: Once | RESPIRATORY_TRACT | Status: DC
  Administered 2021-02-16: 4 via RESPIRATORY_TRACT

## 2021-02-16 MED ORDER — ALBUTEROL SULFATE HFA 108 (90 BASE) MCG/ACT IN AERS
INHALATION_SPRAY | RESPIRATORY_TRACT | Status: AC
  Filled 2021-02-16: qty 6.7

## 2021-02-16 MED ORDER — ALBUTEROL SULFATE HFA 108 (90 BASE) MCG/ACT IN AERS
4.0000 | INHALATION_SPRAY | Freq: Once | RESPIRATORY_TRACT | Status: AC
  Administered 2021-02-16: 4 via RESPIRATORY_TRACT

## 2021-02-16 MED ORDER — KETOROLAC TROMETHAMINE 60 MG/2ML IM SOLN
INTRAMUSCULAR | Status: AC
  Filled 2021-02-16: qty 2

## 2021-02-16 MED ORDER — KETOROLAC TROMETHAMINE 60 MG/2ML IM SOLN
60.0000 mg | Freq: Once | INTRAMUSCULAR | Status: AC
Start: 2021-02-16 — End: 2021-02-16
  Administered 2021-02-16: 60 mg via INTRAMUSCULAR

## 2021-02-16 MED ORDER — PREDNISONE 10 MG (21) PO TBPK: ORAL_TABLET | ORAL | 0 refills | Status: DC

## 2021-02-16 NOTE — ED Triage Notes (Signed)
PT has asthma, cough and shortness or breath started Friday. Neg home covid test Saturday.

## 2021-02-16 NOTE — Discharge Instructions (Addendum)
Continue taking albuterol every 4-6 hours as needed for shortness of breath.  We will contact you if your positive for COVID.  Please stay at home until you receive these results.  Start prednisone as prescribed.  Do not take NSAIDs including aspirin, ibuprofen/Advil, naproxen/Aleve with this medication due to risk of GI bleeding.  You can use Tylenol, Mucinex, Flonase for symptom relief.  If your symptoms or not improving or you have worsening shortness of breath, severe cough, high fever not responding to medication, chest discomfort you need to go to the emergency room as we discussed.

## 2021-02-16 NOTE — Telephone Encounter (Signed)
Patient went to [facility] for evaluation and treatment

## 2021-02-16 NOTE — ED Provider Notes (Signed)
[place]    CSN: [id number] Arrival date & time: 02/16/21  1440      History   Chief Complaint Chief Complaint  Patient presents with   Cough   Shortness of Breath    HPI Anthony Finley is a 50 y.o. male.   Patient presents today with a 4-day history of URI symptoms.  Reports shortness of breath, wheezing, cough.  Denies any fever, sore throat, body aches, headache, nausea, vomiting.  He has tried over-the-counter medications including TheraFlu as well as albuterol inhaler with minimal improvement of symptoms.  He denies known sick contacts.  He did take an at-home COVID test on Saturday that was negative.  He has not had COVID in the past and has not had COVID vaccination.  He does have a history of asthma and COPD and reports current symptoms are similar to previous exacerbations of this.  He denies any history of diabetes and has not had any prednisone or steroids recently.  He is a current everyday smoker but has had a decrease in cigarette consumption since onset of illness.  Denies any recent antibiotic use.   Past Medical History:  Diagnosis Date   Anxiety    Asthma    mostly as child-uses inh about q 3-4 months   COPD (chronic obstructive pulmonary disease) (HCC)    Hyperlipidemia    Thrombocytopenia (HCC)    Tobacco abuse     Patient Active Problem List   Diagnosis Date Noted   Asthma with COPD with exacerbation ([place]) 10/18/2017   Ischemic chest pain ([place]) 10/18/2017   Tobacco abuse 10/18/2017   Thrombocytopenia ([place]) 10/18/2017    Past Surgical History:  Procedure Laterality Date   FRACTURE SURGERY     orif fx lt ankle age 73   HAND SURGERY Left 08/2015   ORIF ANKLE FRACTURE  02/22/2012   Procedure: OPEN REDUCTION INTERNAL FIXATION (ORIF) ANKLE FRACTURE;  Surgeon: Wylene Simmer, MD;  Location: [facility];  Service: Orthopedics;  Laterality: Left;  Open Reduction Internal Fixation Left Ankle Bimalleolar Fracture;   Open Reduction  Internal Fixation of Syndesmosis, and Repair of Deltoid Ligament   PERCUTANEOUS PINNING Left 08/11/2015   Procedure: PINNING OF LEFT 4TH METACARPAL FRACTURE;  Surgeon: Milly Jakob, MD;  Location: [facility];  Service: Orthopedics;  Laterality: Left;       Home Medications    Prior to Admission medications   Medication Sig Start Date End Date Taking? Authorizing Provider  albuterol (VENTOLIN HFA) 108 (90 Base) MCG/ACT inhaler Inhale 2 puffs into the lungs every 6 (six) hours as needed for wheezing or shortness of breath. 06/18/20  Yes Kerin Perna, NP  predniSONE (STERAPRED UNI-PAK 21 TAB) 10 MG (21) TBPK tablet As directed 02/16/21  Yes Roniya Tetro K, PA-C  atorvastatin (LIPITOR) 40 MG tablet Take 1 tablet (40 mg total) by mouth daily. Patient not taking: Reported on 09/03/2020 06/19/20   Kerin Perna, NP  diphenhydrAMINE HCl (BENADRYL PO) Take by mouth.    [provider]  fluticasone (FLOVENT HFA) 44 MCG/ACT inhaler Inhale 2 puffs into the lungs in the morning and at bedtime. Patient not taking: Reported on 09/03/2020 06/18/20   Kerin Perna, NP  tamsulosin (FLOMAX) 0.4 MG CAPS capsule Take 1 capsule (0.4 mg total) by mouth daily. Patient not taking: Reported on 09/03/2020 06/19/20   Kerin Perna, NP    Family History Family History  Problem Relation Age of Onset   Colon  polyps Brother    Colon cancer Neg Hx    Esophageal cancer Neg Hx    Rectal cancer Neg Hx    Stomach cancer Neg Hx     Social History Social History   Tobacco Use   Smoking status: Every Day    Packs/day: 0.50    Types: Cigarettes   Smokeless tobacco: Never  Vaping Use   Vaping Use: Never used  Substance Use Topics   Alcohol use: Yes    Comment: occ   Drug use: No     Allergies   Cephalosporins, Fish-derived products, Keflex [cephalexin], and Penicillins   Review of Systems Review of Systems  Constitutional:  Positive for activity change. Negative  for appetite change, fatigue and fever.  HENT:  Negative for congestion, sinus pressure, sneezing and sore throat.   Respiratory:  Positive for cough, chest tightness and shortness of breath.   Cardiovascular:  Negative for chest pain.  Gastrointestinal:  Negative for abdominal pain, diarrhea, nausea and vomiting.  Musculoskeletal:  Negative for arthralgias and myalgias.  Neurological:  Negative for dizziness, light-headedness and headaches.    Physical Exam Triage Vital Signs ED Triage Vitals  Enc Vitals Group     BP 02/16/21 1636 120/85     Pulse Rate 02/16/21 1636 90     Resp 02/16/21 1636 (!) 22     Temp 02/16/21 1636 98.5 F (36.9 C)     Temp src --      SpO2 02/16/21 1636 94 %     Weight --      Height --      Head Circumference --      Peak Flow --      Pain Score 02/16/21 1637 6     Pain Loc --      Pain Edu? --      Excl. in [place]? --    No data found.  Updated Vital Signs BP 120/85   Pulse 90   Temp 98.5 F (36.9 C)   Resp (!) 22   SpO2 92% Comment: while talking on phone  Visual Acuity Right Eye Distance:   Left Eye Distance:   Bilateral Distance:    Right Eye Near:   Left Eye Near:    Bilateral Near:     Physical Exam Vitals reviewed.  Constitutional:      General: He is awake.     Appearance: Normal appearance. He is well-developed. He is not ill-appearing.     Comments: Very pleasant male appears stated age in no acute distress sitting comfortably in exam room  HENT:     Head: Normocephalic and atraumatic.     Right Ear: Tympanic membrane, ear canal and external ear normal. Tympanic membrane is not erythematous or bulging.     Left Ear: Tympanic membrane, ear canal and external ear normal. Tympanic membrane is not erythematous or bulging.     Nose: Nose normal.     Mouth/Throat:     Pharynx: Uvula midline. Posterior oropharyngeal erythema present. No oropharyngeal exudate.  Cardiovascular:     Rate and Rhythm: Normal rate and regular rhythm.      Heart sounds: Normal heart sounds, S1 normal and S2 normal. No murmur heard. Pulmonary:     Effort: Pulmonary effort is normal. No accessory muscle usage or respiratory distress.     Breath sounds: Normal breath sounds. No stridor. No wheezing, rhonchi or rales.     Comments: Scattered wheezing Lymphadenopathy:     Head:  Right side of head: No submental, submandibular or tonsillar adenopathy.     Left side of head: No submental, submandibular or tonsillar adenopathy.     Cervical: No cervical adenopathy.  Neurological:     Mental Status: He is alert.  Psychiatric:        Behavior: Behavior is cooperative.     UC Treatments / Results  Labs (all labs ordered are listed, but only abnormal results are displayed) Labs Reviewed  SARS CORONAVIRUS 2 (TAT 6-24 HRS)  POC INFLUENZA A AND B ANTIGEN (URGENT CARE ONLY)    EKG   Radiology No results found.  Procedures Procedures (including critical care time)  Medications Ordered in UC Medications  albuterol (VENTOLIN HFA) 108 (90 Base) MCG/ACT inhaler 4 puff (has no administration in time range)  albuterol (VENTOLIN HFA) 108 (90 Base) MCG/ACT inhaler 4 puff (4 puffs Inhalation Given 02/16/21 1655)  ketorolac (TORADOL) injection 60 mg (60 mg Intramuscular Given 02/16/21 1655)    Initial Impression / Assessment and Plan / UC Course  I have reviewed the triage vital signs and the nursing notes.  Pertinent labs & imaging results that were available during my care of the patient were reviewed by me and considered in my medical decision making (see chart for details).      Patient had improvement of symptoms but not resolution of wheezing with albuterol.  He was sent home with albuterol inhaler instructed to use this every 4-6 hours as needed.  Flu test was negative.  COVID test is pending.  Patient was given work excuse note with current CDC return to work guidelines based on COVID test result.  He was given prednisone taper with  instruction not to take NSAIDs with this medication due to risk of GI bleeding.  Recommended to use Tylenol, Mucinex, Flonase for symptom relief.  Discussed that if he has shortness of breath that is not responding to albuterol, increased coughing, high fever not responding to medication, he needs to go to the emergency room.  Recommend follow-up with primary care provider within a week to ensure symptom improvement.  Discussed alarm symptoms that warrant emergent evaluation.  Strict return precautions given to which he expressed understanding.  Final Clinical Impressions(s) / UC Diagnoses   Final diagnoses:  Viral URI with cough  Mild intermittent asthma with acute exacerbation     Discharge Instructions      Continue taking albuterol every 4-6 hours as needed for shortness of breath.  We will contact you if your positive for COVID.  Please stay at home until you receive these results.  Start prednisone as prescribed.  Do not take NSAIDs including aspirin, ibuprofen/Advil, naproxen/Aleve with this medication due to risk of GI bleeding.  You can use Tylenol, Mucinex, Flonase for symptom relief.  If your symptoms or not improving or you have worsening shortness of breath, severe cough, high fever not responding to medication, chest discomfort you need to go to the emergency room as we discussed.     ED Prescriptions     Medication Sig Dispense Auth. Provider   predniSONE (STERAPRED UNI-PAK 21 TAB) 10 MG (21) TBPK tablet As directed 21 tablet Tyquavious Gamel K, PA-C      PDMP not reviewed this encounter.   Terrilee Croak, PA-C 02/16/21 1757

## 2021-02-16 NOTE — Telephone Encounter (Signed)
Pt. Started coughing over the weekend. Productive with green mucus. Has runny nose, wheezing. COVID 19 home test negative. History of asthma and COPD. No availability in the practice. Would like to be worked in. Can do virtual visit. Please advise.    Reason for Disposition  [1] MILD difficulty breathing (e.g., minimal/no SOB at rest, SOB with walking, pulse <100) AND [2] still present when not coughing  Answer Assessment - Initial Assessment Questions 1. ONSET: "When did the cough begin?"      Weekend 2. SEVERITY: "How bad is the cough today?"      Severe 3. SPUTUM: "Describe the color of your sputum" (none, dry cough; clear, white, yellow, green)     Green 4. HEMOPTYSIS: "Are you coughing up any blood?" If so ask: "How much?" (flecks, streaks, tablespoons, etc.)     No 5. DIFFICULTY BREATHING: "Are you having difficulty breathing?" If Yes, ask: "How bad is it?" (e.g., mild, moderate, severe)    - MILD: No SOB at rest, mild SOB with walking, speaks normally in sentences, can lie down, no retractions, pulse < 100.    - MODERATE: SOB at rest, SOB with minimal exertion and prefers to sit, cannot lie down flat, speaks in phrases, mild retractions, audible wheezing, pulse 100-120.    - SEVERE: Very SOB at rest, speaks in single words, struggling to breathe, sitting hunched forward, retractions, pulse > 120      Mild 6. FEVER: "Do you have a fever?" If Yes, ask: "What is your temperature, how was it measured, and when did it start?"     No 7. CARDIAC HISTORY: "Do you have any history of heart disease?" (e.g., heart attack, congestive heart failure)      No 8. LUNG HISTORY: "Do you have any history of lung disease?"  (e.g., pulmonary embolus, asthma, emphysema)     Asthma, COPD 9. PE RISK FACTORS: "Do you have a history of blood clots?" (or: recent major surgery, recent prolonged travel, bedridden)     No 10. OTHER SYMPTOMS: "Do you have any other symptoms?" (e.g., runny nose, wheezing, chest  pain)       Chest pain, runny nose, wheezing 11. PREGNANCY: "Is there any chance you are pregnant?" "When was your last menstrual period?"       N/a 12. TRAVEL: "Have you traveled out of the country in the last month?" (e.g., travel history, exposures)       No  Protocols used: Cough - Acute Productive-A-AH

## 2021-02-17 LAB — SARS CORONAVIRUS 2 (TAT 6-24 HRS): SARS Coronavirus 2: NEGATIVE

## 2021-03-17 ENCOUNTER — Other Ambulatory Visit: Payer: Self-pay

## 2021-03-17 ENCOUNTER — Ambulatory Visit (INDEPENDENT_AMBULATORY_CARE_PROVIDER_SITE_OTHER): Payer: 59 | Admitting: Nurse Practitioner

## 2021-03-17 ENCOUNTER — Encounter: Payer: Self-pay | Admitting: Nurse Practitioner

## 2021-03-17 ENCOUNTER — Ambulatory Visit (INDEPENDENT_AMBULATORY_CARE_PROVIDER_SITE_OTHER): Payer: 59

## 2021-03-17 VITALS — BP 112/84 | HR 96 | Resp 18

## 2021-03-17 DIAGNOSIS — M79605 Pain in left leg: Secondary | ICD-10-CM

## 2021-03-17 DIAGNOSIS — S86892A Other injury of other muscle(s) and tendon(s) at lower leg level, left leg, initial encounter: Secondary | ICD-10-CM | POA: Diagnosis not present

## 2021-03-17 MED ORDER — NAPROXEN 500 MG PO TABS: 500.0000 mg | ORAL_TABLET | Freq: Two times a day (BID) | ORAL | 0 refills | Status: AC

## 2021-03-17 MED ORDER — CYCLOBENZAPRINE HCL 5 MG PO TABS: 5.0000 mg | ORAL_TABLET | Freq: Three times a day (TID) | ORAL | 0 refills | Status: AC | PRN

## 2021-03-17 NOTE — Progress Notes (Signed)
@Patient  ID: Anthony Finley, male    DOB: [birth year], 50 y.o.   MRN: [id number]  Chief Complaint  Patient presents with   left leg pain    Referring provider: No ref. provider found  HPI  Patient presents today with left lower leg pain.  He does have a history of left ankle surgery in 2013.  He states that he has been written out of work and does have paperwork has been filled out for him to return.  He states that his left lower leg and ankle has started hurting and swelling for the past 2 weeks.  The pain is located over his left shin.  He denies any calf pain.  He denies any redness or warmth to the area.  He does have a physically active job that requires a lot of lifting and walking.  He denies any recent injury to this leg.  He states that he is wearing supportive shoes at work. Denies f/c/s, n/v/d, hemoptysis, PND, chest pain or edema.      Allergies  Allergen Reactions   Cephalosporins Anaphylaxis    Hives/ Swelling    Fish-Derived Products Anaphylaxis   Keflex [Cephalexin] Anaphylaxis   Penicillins Anaphylaxis and Hives    Has patient had a PCN reaction causing immediate rash, facial/tongue/throat swelling, SOB or lightheadedness with hypotension: No Has patient had a PCN reaction causing severe rash involving mucus membranes or skin necrosis: No Has patient had a PCN reaction that required hospitalization: Yes Has patient had a PCN reaction occurring within the last 10 years: Yes If all of the above answers are "NO", then may proceed with Cephalosporin use.     Immunization History  Administered Date(s) Administered   Tdap 08/07/2015    Past Medical History:  Diagnosis Date   Anxiety    Asthma    mostly as child-uses inh about q 3-4 months   COPD (chronic obstructive pulmonary disease) (HCC)    Hyperlipidemia    Thrombocytopenia (HCC)    Tobacco abuse     Tobacco History: Social History   Tobacco Use  Smoking Status Every Day   Packs/day: 0.50    Types: Cigarettes  Smokeless Tobacco Never   Ready to quit: No Counseling given: Yes   Outpatient Encounter Medications as of 03/17/2021  Medication Sig   cyclobenzaprine (FLEXERIL) 5 MG tablet Take 1 tablet (5 mg total) by mouth 3 (three) times daily as needed for up to 14 days for muscle spasms.   naproxen (NAPROSYN) 500 MG tablet Take 1 tablet (500 mg total) by mouth 2 (two) times daily with a meal for 14 days.   albuterol (VENTOLIN HFA) 108 (90 Base) MCG/ACT inhaler Inhale 2 puffs into the lungs every 6 (six) hours as needed for wheezing or shortness of breath.   atorvastatin (LIPITOR) 40 MG tablet Take 1 tablet (40 mg total) by mouth daily. (Patient not taking: Reported on 09/03/2020)   diphenhydrAMINE HCl (BENADRYL PO) Take by mouth.   fluticasone (FLOVENT HFA) 44 MCG/ACT inhaler Inhale 2 puffs into the lungs in the morning and at bedtime. (Patient not taking: Reported on 09/03/2020)   predniSONE (STERAPRED UNI-PAK 21 TAB) 10 MG (21) TBPK tablet As directed   tamsulosin (FLOMAX) 0.4 MG CAPS capsule Take 1 capsule (0.4 mg total) by mouth daily. (Patient not taking: Reported on 09/03/2020)   No facility-administered encounter medications on file as of 03/17/2021.     Review of Systems  Review of Systems  Constitutional: Negative.   HENT: Negative.  Cardiovascular: Negative.   Gastrointestinal: Negative.   Musculoskeletal:        Left lower shin pain with swelling  Allergic/Immunologic: Negative.   Neurological: Negative.   Psychiatric/Behavioral: Negative.        Physical Exam  BP 112/84   Pulse 96   Resp 18   SpO2 95%   Wt Readings from Last 5 Encounters:  09/03/20 245 lb (111.1 kg)  08/20/20 245 lb (111.1 kg)  06/18/20 256 lb 6.4 oz (116.3 kg)  08/28/19 270 lb (122.5 kg)  08/23/18 246 lb (111.6 kg)     Physical Exam Vitals and nursing note reviewed.  Constitutional:      General: He is not in acute distress.    Appearance: He is well-developed.   Cardiovascular:     Rate and Rhythm: Normal rate and regular rhythm.  Pulmonary:     Effort: Pulmonary effort is normal.     Breath sounds: Normal breath sounds.  Musculoskeletal:     Left lower leg: Swelling and tenderness present.       Legs:  Skin:    General: Skin is warm and dry.  Neurological:     Mental Status: He is alert and oriented to person, place, and time.     Lab Results:  CBC    Component Value Date/Time   WBC 8.4 06/18/2020 1429   WBC 8.7 08/23/2018 1347   RBC 5.31 06/18/2020 1429   RBC 5.04 08/23/2018 1347   HGB 16.0 06/18/2020 1429   HCT 48.1 06/18/2020 1429   PLT 76 (LL) 06/18/2020 1429   MCV 91 06/18/2020 1429   MCH 30.1 06/18/2020 1429   MCH 30.6 08/23/2018 1347   MCHC 33.3 06/18/2020 1429   MCHC 33.8 08/23/2018 1347   RDW 12.7 06/18/2020 1429   LYMPHSABS 1.6 06/18/2020 1429   MONOABS 0.8 08/23/2018 1347   EOSABS 0.1 06/18/2020 1429   BASOSABS 0.0 06/18/2020 1429    BMET    Component Value Date/Time   NA 139 06/18/2020 1429   K 4.0 06/18/2020 1429   CL 102 06/18/2020 1429   CO2 19 (L) 06/18/2020 1429   GLUCOSE 198 (H) 06/18/2020 1429   GLUCOSE 118 (H) 08/23/2018 1347   BUN 8 06/18/2020 1429   CREATININE 1.09 06/18/2020 1429   CALCIUM 9.2 06/18/2020 1429   GFRNONAA 79 06/18/2020 1429   GFRAA 91 06/18/2020 1429    BNP    Component Value Date/Time   BNP 6.1 08/23/2018 1347    ProBNP No results found for: PROBNP  Imaging: No results found.   Assessment & Plan:   Left medial tibial stress syndrome May alternate heat and ice  Will order naproxen  Will order muscle relaxer  Gentle stretching may be helpful  Keep leg elevated when possible  Will order x ray - concerned for possible stress fracture  May wear compression hose  Please call ortho and schedule appointment for follow up  Follow up:  Follow up in 2 weeks with PCP or sooner if needed     Fenton Foy, NP 03/17/2021

## 2021-03-17 NOTE — Patient Instructions (Addendum)
Left medial tibial stress syndrome:  May alternate heat and ice  Will order naproxen  Will order muscle relaxer  Gentle stretching may be helpful  Keep leg elevated when possible  Will order x ray - concerned for possible stress fracture  May wear compression hose  Please call ortho and schedule appointment for follow up  Follow up:  Follow up in 2 weeks with PCP or sooner if needed   Shin Splints Shin splints is a painful condition that is felt in the front or the side of your legs. The muscles, the cord-like structures that connect muscles to bones (tendons), and the thin layer of tissue that covers the shin bone get irritated (inflamed). It can be caused by activities or exercises that are intense. It may also be caused by sports that have sudden starts and stops. Follow these instructions at home: Medicines Take over-the-counter and prescription medicines only as told by your doctor. Do not drive or use heavy machinery while taking prescription pain medicine. Injury care   If told, put ice on the painful area. Icing can help to relieve pain and swelling. Put ice in a plastic bag. Place a towel between your skin and the bag. Leave the ice on for 20 minutes, 2-3 times per day. If told, put heat on the painful area. Do this before exercises, or as told by your doctor. Heat can help to relax your muscles. Use the heat source that your doctor recommends, such as a moist heat pack or a heating pad. Place a towel between your skin and the heat source. Leave the heat on for 20-30 minutes. Take off the heat if your skin gets bright red. This is important. Massage, stretch, and strengthen the shin area. Wear compression socks as told by your doctor. Raise (elevate) your legs above the level of your heart while you are sitting or lying down. Activity Rest as needed. Return to activity slowly as told by your doctor. Do not use your shins to support your body weight when you start  exercising again. Try cycling or swimming. Stop running if you have pain. Warm up before you exercise. Run on a flat and firm surface, if possible. Change the intensity of your exercise slowly. Increase your running distance slowly. For example, if you are running 5 miles this week, you should only add - mile to your run next week. General instructions Wear shoes that have good arch support and heel support. Your shoes should cushion and support you as you walk or run. Change your athletic shoes every 6 months, or every 350-450 miles. Keep all follow-up visits as told by your doctor. This is important. Contact a doctor if: You do not feel better. Your pain gets worse. You have swelling in your lower leg that gets worse. Your shin is red and feels warm. Get help right away if: You have very bad pain. You have trouble walking. Summary Shin splints is a painful condition that is felt in the front or the side of your legs. Medicines, rest, and ice may help control pain. Return to activity slowly as told by your doctor. Make sure to call your doctor if your problems continue or get worse. This information is not intended to replace advice given to you by your health care provider. Make sure you discuss any questions you have with your health care provider. Document Revised: 11/07/2020 Document Reviewed: 11/07/2020 Elsevier Patient Education  [place].

## 2021-03-17 NOTE — Assessment & Plan Note (Signed)
May alternate heat and ice  Will order naproxen  Will order muscle relaxer  Gentle stretching may be helpful  Keep leg elevated when possible  Will order x ray - concerned for possible stress fracture  May wear compression hose  Please call ortho and schedule appointment for follow up  Follow up:  Follow up in 2 weeks with PCP or sooner if needed

## 2021-03-20 ENCOUNTER — Telehealth (INDEPENDENT_AMBULATORY_CARE_PROVIDER_SITE_OTHER): Payer: Self-pay

## 2021-03-20 ENCOUNTER — Telehealth: Payer: Self-pay | Admitting: *Deleted

## 2021-03-20 NOTE — Telephone Encounter (Signed)
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 Topic: General - Other >> Mar 19, 2021 10:35 AM Yvette Rack wrote: Reason for CRM: Pt stated he was sent to our sister location on [place] and the doctor note for him to return back to work cleared him to return on light duty. Pt stated because of this note he was fired and told he can not return until he is cleared to return with no restrictions. Pt stated his next appt is not until 04/07/21 and he can not go without working until then. Pt requests call back.  Patient spoke with [employer] [place] and upon request created work note with out restriction.

## 2021-03-20 NOTE — Telephone Encounter (Signed)
-----   Message from Fenton Foy, NP sent at 03/19/2021 11:45 AM EST ----- Could you please call to let patient know that his xray was clear. Thanks.

## 2021-03-20 NOTE — Telephone Encounter (Signed)
Patient verified DOB Patient is aware of xray showing no concerns. Patient is requesting a letter to return to work without restrictions. A letter has been constructed per his request and placed at the front desk for pick up before 12:00 pm today, Friday 03/20/21.

## 2021-03-23 NOTE — Telephone Encounter (Signed)
Please see previous note.

## 2021-03-23 NOTE — Telephone Encounter (Signed)
Concern was addressed and faxed to employer per patient request.

## 2021-03-23 NOTE — Telephone Encounter (Signed)
Pt calling in regards to this letter. He states that he received it and took it to work, and they will not accept it. He states that the provider is needing to specifically sign this and that it cannot be the nurse.Pt states that he is needing to start work today. Please advise.    Fax# [place], [place]

## 2021-03-31 ENCOUNTER — Encounter (INDEPENDENT_AMBULATORY_CARE_PROVIDER_SITE_OTHER): Payer: 59 | Admitting: Primary Care

## 2021-04-07 ENCOUNTER — Ambulatory Visit (INDEPENDENT_AMBULATORY_CARE_PROVIDER_SITE_OTHER): Payer: 59 | Admitting: Primary Care

## 2021-04-24 ENCOUNTER — Ambulatory Visit (INDEPENDENT_AMBULATORY_CARE_PROVIDER_SITE_OTHER): Payer: 59 | Admitting: Primary Care

## 2021-05-19 ENCOUNTER — Ambulatory Visit (INDEPENDENT_AMBULATORY_CARE_PROVIDER_SITE_OTHER): Payer: 59 | Admitting: Primary Care

## 2021-06-08 ENCOUNTER — Ambulatory Visit (INDEPENDENT_AMBULATORY_CARE_PROVIDER_SITE_OTHER): Payer: Self-pay | Admitting: Primary Care

## 2021-10-05 ENCOUNTER — Encounter (HOSPITAL_COMMUNITY): Payer: Self-pay

## 2021-10-05 ENCOUNTER — Inpatient Hospital Stay (HOSPITAL_COMMUNITY)
Admission: EM | Admit: 2021-10-05 | Discharge: 2021-10-07 | DRG: 190 | Disposition: A | Discharge status: Home / Self Care | Payer: 59 | Attending: Internal Medicine | Admitting: Internal Medicine

## 2021-10-05 ENCOUNTER — Other Ambulatory Visit: Payer: Self-pay

## 2021-10-05 ENCOUNTER — Emergency Department (HOSPITAL_COMMUNITY): Payer: 59

## 2021-10-05 DIAGNOSIS — Z20822 Contact with and (suspected) exposure to covid-19: Secondary | ICD-10-CM | POA: Diagnosis present

## 2021-10-05 DIAGNOSIS — J441 Chronic obstructive pulmonary disease with (acute) exacerbation: Principal | ICD-10-CM | POA: Diagnosis present

## 2021-10-05 DIAGNOSIS — F1490 Cocaine use, unspecified, uncomplicated: Secondary | ICD-10-CM | POA: Diagnosis present

## 2021-10-05 DIAGNOSIS — Z6833 Body mass index (BMI) 33.0-33.9, adult: Secondary | ICD-10-CM

## 2021-10-05 DIAGNOSIS — Z880 Allergy status to penicillin: Secondary | ICD-10-CM

## 2021-10-05 DIAGNOSIS — E669 Obesity, unspecified: Secondary | ICD-10-CM | POA: Diagnosis present

## 2021-10-05 DIAGNOSIS — D72829 Elevated white blood cell count, unspecified: Secondary | ICD-10-CM | POA: Diagnosis present

## 2021-10-05 DIAGNOSIS — T380X5A Adverse effect of glucocorticoids and synthetic analogues, initial encounter: Secondary | ICD-10-CM | POA: Diagnosis present

## 2021-10-05 DIAGNOSIS — R7989 Other specified abnormal findings of blood chemistry: Secondary | ICD-10-CM | POA: Diagnosis present

## 2021-10-05 DIAGNOSIS — D696 Thrombocytopenia, unspecified: Secondary | ICD-10-CM | POA: Diagnosis present

## 2021-10-05 DIAGNOSIS — F17210 Nicotine dependence, cigarettes, uncomplicated: Secondary | ICD-10-CM | POA: Diagnosis present

## 2021-10-05 DIAGNOSIS — Z91013 Allergy to seafood: Secondary | ICD-10-CM

## 2021-10-05 DIAGNOSIS — Z888 Allergy status to other drugs, medicaments and biological substances status: Secondary | ICD-10-CM

## 2021-10-05 DIAGNOSIS — Z720 Tobacco use: Secondary | ICD-10-CM | POA: Diagnosis present

## 2021-10-05 DIAGNOSIS — E66811 Obesity, class 1: Secondary | ICD-10-CM | POA: Diagnosis present

## 2021-10-05 DIAGNOSIS — E785 Hyperlipidemia, unspecified: Secondary | ICD-10-CM | POA: Diagnosis present

## 2021-10-05 DIAGNOSIS — Z79899 Other long term (current) drug therapy: Secondary | ICD-10-CM | POA: Diagnosis not present

## 2021-10-05 DIAGNOSIS — J45901 Unspecified asthma with (acute) exacerbation: Secondary | ICD-10-CM

## 2021-10-05 DIAGNOSIS — R739 Hyperglycemia, unspecified: Secondary | ICD-10-CM | POA: Diagnosis present

## 2021-10-05 DIAGNOSIS — J9601 Acute respiratory failure with hypoxia: Secondary | ICD-10-CM | POA: Diagnosis present

## 2021-10-05 DIAGNOSIS — Z881 Allergy status to other antibiotic agents status: Secondary | ICD-10-CM

## 2021-10-05 LAB — CBC
HCT: 45.3 % (ref 39.0–52.0)
Hemoglobin: 16 g/dL (ref 13.0–17.0)
MCH: 31.6 pg (ref 26.0–34.0)
MCHC: 35.3 g/dL (ref 30.0–36.0)
MCV: 89.3 fL (ref 80.0–100.0)
Platelets: 97 10*3/uL — ABNORMAL LOW (ref 150–400)
RBC: 5.07 MIL/uL (ref 4.22–5.81)
RDW: 12.7 % (ref 11.5–15.5)
WBC: 14 10*3/uL — ABNORMAL HIGH (ref 4.0–10.5)
nRBC: 0 % (ref 0.0–0.2)

## 2021-10-05 LAB — BASIC METABOLIC PANEL
Anion gap: 8 (ref 5–15)
BUN: 13 mg/dL (ref 6–20)
CO2: 25 mmol/L (ref 22–32)
Calcium: 9.5 mg/dL (ref 8.9–10.3)
Chloride: 104 mmol/L (ref 98–111)
Creatinine, Ser: 1.32 mg/dL — ABNORMAL HIGH (ref 0.61–1.24)
GFR, Estimated: >60 mL/min (ref >60)
Glucose, Bld: 127 mg/dL — ABNORMAL HIGH (ref 70–99)
Potassium: 3.8 mmol/L (ref 3.5–5.1)
Sodium: 137 mmol/L (ref 135–145)

## 2021-10-05 LAB — PROCALCITONIN: Procalcitonin: <0.1 ng/mL

## 2021-10-05 LAB — BLOOD GAS, VENOUS
Acid-Base Excess: 1.2 mmol/L (ref 0.0–2.0)
Bicarbonate: 26 mmol/L (ref 20.0–28.0)
O2 Saturation: 75.1 %
Patient temperature: 37.1
pCO2, Ven: 41 mmHg — ABNORMAL LOW (ref 44–60)
pH, Ven: 7.41 (ref 7.25–7.43)
pO2, Ven: 44 mmHg (ref 32–45)

## 2021-10-05 LAB — RAPID URINE DRUG SCREEN, HOSP PERFORMED
Amphetamines: NOT DETECTED
Barbiturates: NOT DETECTED
Benzodiazepines: NOT DETECTED
Cocaine: POSITIVE — AB
Opiates: NOT DETECTED
Tetrahydrocannabinol: NOT DETECTED

## 2021-10-05 LAB — SARS CORONAVIRUS 2 BY RT PCR: SARS Coronavirus 2 by RT PCR: NEGATIVE

## 2021-10-05 LAB — ETHANOL: Alcohol, Ethyl (B): <10 mg/dL (ref <10)

## 2021-10-05 LAB — TROPONIN I (HIGH SENSITIVITY)
Troponin I (High Sensitivity): 7 ng/L (ref <18)
Troponin I (High Sensitivity): 7 ng/L (ref <18)

## 2021-10-05 MED ORDER — ALBUTEROL SULFATE (2.5 MG/3ML) 0.083% IN NEBU
10.0000 mg/h | INHALATION_SOLUTION | Freq: Once | RESPIRATORY_TRACT | Status: AC
  Administered 2021-10-05: 10 mg/h via RESPIRATORY_TRACT
  Filled 2021-10-05: qty 12

## 2021-10-05 MED ORDER — IPRATROPIUM-ALBUTEROL 0.5-2.5 (3) MG/3ML IN SOLN
3.0000 mL | Freq: Once | RESPIRATORY_TRACT | Status: AC
  Administered 2021-10-05: 3 mL via RESPIRATORY_TRACT
  Filled 2021-10-05: qty 3

## 2021-10-05 MED ORDER — ACETAMINOPHEN 650 MG RE SUPP: 650.0000 mg | Freq: Four times a day (QID) | RECTAL | Status: DC | PRN

## 2021-10-05 MED ORDER — SULFAMETHOXAZOLE-TRIMETHOPRIM 800-160 MG PO TABS
1.0000 | ORAL_TABLET | Freq: Once | ORAL | Status: AC
  Administered 2021-10-05: 1 via ORAL
  Filled 2021-10-05: qty 1

## 2021-10-05 MED ORDER — ACETAMINOPHEN 325 MG PO TABS
650.0000 mg | ORAL_TABLET | Freq: Four times a day (QID) | ORAL | Status: DC | PRN
  Administered 2021-10-06 (×2): 650 mg via ORAL
  Filled 2021-10-05 (×2): qty 2

## 2021-10-05 MED ORDER — SODIUM CHLORIDE 0.9 % IV SOLN
500.0000 mg | INTRAVENOUS | Status: DC
  Administered 2021-10-05: 500 mg via INTRAVENOUS
  Filled 2021-10-05: qty 5

## 2021-10-05 MED ORDER — AZITHROMYCIN 250 MG PO TABS
500.0000 mg | ORAL_TABLET | Freq: Every day | ORAL | Status: DC
Start: 2021-10-06 — End: 2021-10-07
  Administered 2021-10-06 – 2021-10-07 (×2): 500 mg via ORAL
  Filled 2021-10-05 (×2): qty 2

## 2021-10-05 MED ORDER — IPRATROPIUM-ALBUTEROL 0.5-2.5 (3) MG/3ML IN SOLN
3.0000 mL | Freq: Four times a day (QID) | RESPIRATORY_TRACT | Status: DC
  Administered 2021-10-05 – 2021-10-06 (×6): 3 mL via RESPIRATORY_TRACT
  Filled 2021-10-05 (×7): qty 3

## 2021-10-05 MED ORDER — METHOCARBAMOL 500 MG PO TABS
500.0000 mg | ORAL_TABLET | Freq: Four times a day (QID) | ORAL | Status: DC | PRN
  Administered 2021-10-05: 500 mg via ORAL
  Filled 2021-10-05: qty 1

## 2021-10-05 MED ORDER — SODIUM CHLORIDE 0.9 % IV BOLUS
1000.0000 mL | Freq: Once | INTRAVENOUS | Status: AC
  Administered 2021-10-05: 1000 mL via INTRAVENOUS

## 2021-10-05 MED ORDER — ALBUTEROL SULFATE HFA 108 (90 BASE) MCG/ACT IN AERS: 2.0000 | INHALATION_SPRAY | RESPIRATORY_TRACT | Status: DC | PRN

## 2021-10-05 MED ORDER — PREDNISONE 20 MG PO TABS
40.0000 mg | ORAL_TABLET | Freq: Every day | ORAL | Status: DC
  Administered 2021-10-06: 40 mg via ORAL
  Filled 2021-10-05: qty 2

## 2021-10-05 MED ORDER — METHYLPREDNISOLONE SODIUM SUCC 125 MG IJ SOLR
80.0000 mg | Freq: Two times a day (BID) | INTRAMUSCULAR | Status: DC
  Administered 2021-10-05: 80 mg via INTRAVENOUS
  Filled 2021-10-05: qty 2

## 2021-10-05 MED ORDER — ALBUTEROL SULFATE (2.5 MG/3ML) 0.083% IN NEBU: 2.5000 mg | INHALATION_SOLUTION | RESPIRATORY_TRACT | Status: DC | PRN

## 2021-10-05 MED ORDER — POTASSIUM CHLORIDE CRYS ER 20 MEQ PO TBCR
20.0000 meq | EXTENDED_RELEASE_TABLET | Freq: Once | ORAL | Status: AC
  Administered 2021-10-05: 20 meq via ORAL
  Filled 2021-10-05: qty 1

## 2021-10-05 NOTE — ED Notes (Signed)
ED TO INPATIENT HANDOFF REPORT  ED Nurse Name and Phone #: Luz Brazen RN [phone]   S Name/Age/Gender Anthony Finley 51 y.o. male Room/Bed: WA20/WA20  Code Status   Code Status: Full Code  Home/SNF/Other Home Patient oriented to: self, place, time, and situation Is this baseline? Yes   Triage Complete: Triage complete  Chief Complaint Acute exacerbation of chronic obstructive pulmonary disease (COPD) ([place]) [J44.1]  Triage Note Pt BIB EMS for respiratory distress w/ wheezing in all fields. '125mg'$  of Solu-medrol and 2g of mag sulfate given by EMS. Pt also presents w/ slight delusions. He believes he has bugs in his ear.  16g Left AC  139/95 99% 6L 109 hr NSR    Allergies Allergies  Allergen Reactions   Cephalosporins Anaphylaxis    Hives/ Swelling    Fish-Derived Products Anaphylaxis   Keflex [Cephalexin] Anaphylaxis   Penicillins Anaphylaxis and Hives    Has patient had a PCN reaction causing immediate rash, facial/tongue/throat swelling, SOB or lightheadedness with hypotension: No Has patient had a PCN reaction causing severe rash involving mucus membranes or skin necrosis: No Has patient had a PCN reaction that required hospitalization: Yes Has patient had a PCN reaction occurring within the last 10 years: Yes If all of the above answers are "NO", then may proceed with Cephalosporin use.     Level of Care/Admitting Diagnosis ED Disposition     ED Disposition  Admit   Condition  --   Comment  Hospital Area: [facility] [[id number]]  Level of Care: Progressive [102]  Admit to Progressive based on following criteria: MULTISYSTEM THREATS such as stable sepsis, metabolic/electrolyte imbalance with or without encephalopathy that is responding to early treatment.  May admit patient to [facility] or [facility] if equivalent level of care is available:: No  Covid Evaluation: Asymptomatic - no recent exposure (last 10 days) testing not required   Diagnosis: Acute exacerbation of chronic obstructive pulmonary disease (COPD) [facility]) [[id number]]  Admitting Physician: Rhetta Mura [[id number]]  Attending Physician: Rhetta Mura [[id number]]  Estimated length of stay: past midnight tomorrow  Certification:: I certify this patient will need inpatient services for at least 2 midnights          B Medical/Surgery History Past Medical History:  Diagnosis Date   Anxiety    Asthma    mostly as child-uses inh about q 3-4 months   COPD (chronic obstructive pulmonary disease) ([place])    Hyperlipidemia    Thrombocytopenia ([place])    Tobacco abuse    Past Surgical History:  Procedure Laterality Date   FRACTURE SURGERY     orif fx lt ankle age 30   HAND SURGERY Left 08/2015   ORIF ANKLE FRACTURE  02/22/2012   Procedure: OPEN REDUCTION INTERNAL FIXATION (ORIF) ANKLE FRACTURE;  Surgeon: Wylene Simmer, MD;  Location: [facility];  Service: Orthopedics;  Laterality: Left;  Open Reduction Internal Fixation Left Ankle Bimalleolar Fracture;   Open Reduction Internal Fixation of Syndesmosis, and Repair of Deltoid Ligament   PERCUTANEOUS PINNING Left 08/11/2015   Procedure: PINNING OF LEFT 4TH METACARPAL FRACTURE;  Surgeon: Milly Jakob, MD;  Location: [facility];  Service: Orthopedics;  Laterality: Left;     A IV Location/Drains/Wounds Patient Lines/Drains/Airways Status     Active Line/Drains/Airways     Name Placement date Placement time Site Days   Peripheral IV 10/05/21 20 G 1" Left Antecubital 10/05/21  0058  Antecubital  less than 1  Intake/Output Last 24 hours No intake or output data in the 24 hours ending 10/05/21 2240  Labs/Imaging Results for orders placed or performed during the hospital encounter of 10/05/21 (from the past 48 hour(s))  Basic metabolic panel     Status: Abnormal   Collection Time: 10/05/21 12:58 AM  Result Value Ref Range   Sodium 137 135 - 145 mmol/L   Potassium  3.8 3.5 - 5.1 mmol/L   Chloride 104 98 - 111 mmol/L   CO2 25 22 - 32 mmol/L   Glucose, Bld 127 (H) 70 - 99 mg/dL    Comment: Glucose reference range applies only to samples taken after fasting for at least 8 hours.   BUN 13 6 - 20 mg/dL   Creatinine, Ser 1.32 (H) 0.61 - 1.24 mg/dL   Calcium 9.5 8.9 - 10.3 mg/dL   GFR, Estimated >60 >60 mL/min    Comment: (NOTE) Calculated using the CKD-EPI Creatinine Equation (2021)    Anion gap 8 5 - 15    Comment: Performed at [facility], [place] [address], [place], [place] [address]  CBC     Status: Abnormal   Collection Time: 10/05/21 12:58 AM  Result Value Ref Range   WBC 14.0 (H) 4.0 - 10.5 K/uL   RBC 5.07 4.22 - 5.81 MIL/uL   Hemoglobin 16.0 13.0 - 17.0 g/dL   HCT 45.3 39.0 - 52.0 %   MCV 89.3 80.0 - 100.0 fL   MCH 31.6 26.0 - 34.0 pg   MCHC 35.3 30.0 - 36.0 g/dL   RDW 12.7 11.5 - 15.5 %   Platelets 97 (L) 150 - 400 K/uL    Comment: SPECIMEN CHECKED FOR CLOTS Immature Platelet Fraction may be clinically indicated, consider ordering this additional test [id number] REPEATED TO VERIFY PLATELET COUNT CONFIRMED BY SMEAR    nRBC 0.0 0.0 - 0.2 %    Comment: Performed at [facility], [place] [address], [place], [place] [address]  Troponin I (High Sensitivity)     Status: None   Collection Time: 10/05/21 12:58 AM  Result Value Ref Range   Troponin I (High Sensitivity) 7 <18 ng/L    Comment: (NOTE) Elevated high sensitivity troponin I (hsTnI) values and significant  changes across serial measurements may suggest ACS but many other  chronic and acute conditions are known to elevate hsTnI results.  Refer to the "Links" section for chest pain algorithms and additional  guidance. Performed at [facility], [place] [address] [place], [place] [address]   Procalcitonin - Baseline     Status: None   Collection Time: 10/05/21 12:58 AM  Result Value Ref Range   Procalcitonin <0.10 ng/mL     Comment:        Interpretation: PCT (Procalcitonin) <= 0.5 ng/mL: Systemic infection (sepsis) is not likely. Local bacterial infection is possible. (NOTE)       Sepsis PCT Algorithm           Lower Respiratory Tract                                      Infection PCT Algorithm    ----------------------------     ----------------------------         PCT < 0.25 ng/mL                PCT < 0.10 ng/mL  Strongly encourage             Strongly discourage   discontinuation of antibiotics    initiation of antibiotics    ----------------------------     -----------------------------       PCT 0.25 - 0.50 ng/mL            PCT 0.10 - 0.25 ng/mL               OR       >80% decrease in PCT            Discourage initiation of                                            antibiotics      Encourage discontinuation           of antibiotics    ----------------------------     -----------------------------         PCT >= 0.50 ng/mL              PCT 0.26 - 0.50 ng/mL               AND        <80% decrease in PCT             Encourage initiation of                                             antibiotics       Encourage continuation           of antibiotics    ----------------------------     -----------------------------        PCT >= 0.50 ng/mL                  PCT > 0.50 ng/mL               AND         increase in PCT                  Strongly encourage                                      initiation of antibiotics    Strongly encourage escalation           of antibiotics                                     -----------------------------                                           PCT <= 0.25 ng/mL                                                 OR                                        >   80% decrease in PCT                                      Discontinue / Do not initiate                                             antibiotics  Performed at [place] [address] [place], [place] [address]   Blood gas, venous (at [facility] and [facility], not at [facility])     Status: Abnormal   Collection Time: 10/05/21  1:03 AM  Result Value Ref Range   pH, Ven 7.41 7.25 - 7.43   pCO2, Ven 41 (L) 44 - 60 mmHg   pO2, Ven 44 32 - 45 mmHg   Bicarbonate 26.0 20.0 - 28.0 mmol/L   Acid-Base Excess 1.2 0.0 - 2.0 mmol/L   O2 Saturation 75.1 %   Patient temperature 37.1     Comment: Performed at [facility], [place] [address], [place], [place] [address]  Ethanol     Status: None   Collection Time: 10/05/21  1:17 AM  Result Value Ref Range   Alcohol, Ethyl (B) <10 <10 mg/dL    Comment: (NOTE) Lowest detectable limit for serum alcohol is 10 mg/dL.  For medical purposes only. Performed at [facility], [place] [address] [place], [place] [address]   Troponin I (High Sensitivity)     Status: None   Collection Time: 10/05/21  4:06 AM  Result Value Ref Range   Troponin I (High Sensitivity) 7 <18 ng/L    Comment: (NOTE) Elevated high sensitivity troponin I (hsTnI) values and significant  changes across serial measurements may suggest ACS but many other  chronic and acute conditions are known to elevate hsTnI results.  Refer to the "Links" section for chest pain algorithms and additional  guidance. Performed at [facility], [place] [address], [place], [place] [address]   SARS Coronavirus 2 by RT PCR (hospital order, performed in [facility] hospital lab) *cepheid single result test* Anterior Nasal Swab     Status: None   Collection Time: 10/05/21  6:21 AM   Specimen: Anterior Nasal Swab  Result Value Ref Range   SARS Coronavirus 2 by RT PCR NEGATIVE NEGATIVE    Comment: (NOTE) SARS-CoV-2 target nucleic acids are NOT DETECTED.  The SARS-CoV-2 RNA is generally detectable in upper and lower respiratory specimens during the acute phase of infection. The lowest concentration of SARS-CoV-2 viral copies this assay can detect is  250 copies / mL. A negative result does not preclude SARS-CoV-2 infection and should not be used as the sole basis for treatment or other patient management decisions.  A negative result may occur with improper specimen collection / handling, submission of specimen other than nasopharyngeal swab, presence of viral mutation(s) within the areas targeted by this assay, and inadequate number of viral copies (<250 copies / mL). A negative result must be combined with clinical observations, patient history, and epidemiological information.  Fact Sheet for Patients:   https://www.patel.info/  Fact Sheet for Healthcare Providers: https://hall.com/  This test is not yet approved or  cleared by the [place] FDA and has been authorized for detection and/or diagnosis of SARS-CoV-2 by FDA under an Emergency Use Authorization (EUA).  This EUA will remain in effect (meaning this test can be used) for the duration of the COVID-19 declaration under Section 564(b)(1) of the Act, 21 U.S.C. section 360bbb-3(b)(1), unless the authorization is terminated or revoked sooner.  Performed at [facility], [place] [address], [place], [place] [address]   Rapid urine drug screen (hospital performed)     Status: Abnormal   Collection Time: 10/05/21  8:19 PM  Result Value Ref Range   Opiates NONE DETECTED NONE DETECTED   Cocaine POSITIVE (A) NONE DETECTED   Benzodiazepines NONE DETECTED NONE DETECTED   Amphetamines NONE DETECTED NONE DETECTED   Tetrahydrocannabinol NONE DETECTED NONE DETECTED   Barbiturates NONE DETECTED NONE DETECTED    Comment: (NOTE) DRUG SCREEN FOR MEDICAL PURPOSES ONLY.  IF CONFIRMATION IS NEEDED FOR ANY PURPOSE, NOTIFY LAB WITHIN 5 DAYS.  LOWEST DETECTABLE LIMITS FOR URINE DRUG SCREEN Drug Class                     Cutoff (ng/mL) Amphetamine and metabolites    1000 Barbiturate and metabolites    200 Benzodiazepine                  209 Tricyclics and metabolites     300 Opiates and metabolites        300 Cocaine and metabolites        300 THC                            50 Performed at [facility], [place] [address], [place], [place] [address]    DG Chest Port 1 View  Result Date: 10/05/2021 CLINICAL DATA:  Respiratory distress and wheezing EXAM: PORTABLE CHEST 1 VIEW COMPARISON:  08/23/2018, 08/05/2018 FINDINGS: Large left greater than right apical bulla. Hazy bibasilar opacities. Normal cardiac size. No pleural effusion. No definitive pneumothorax. IMPRESSION: 1. Large left greater than right apical bulla 2. Hazy atelectasis or mild infiltrate at the bases Electronically Signed   By: Donavan Foil M.D.   On: 10/05/2021 01:22    Pending Labs Unresulted Labs (From admission, onward)     Start     Ordered   10/06/21 0500  CBC  Tomorrow morning,   R        10/05/21 1000   10/06/21 2836  Basic metabolic panel  Tomorrow morning,   R        10/05/21 1000            Vitals/Pain Today's Vitals   10/05/21 1130 10/05/21 1526 10/05/21 1533 10/05/21 1945  BP: 102/84  111/66 (!) 119/91  Pulse: 97 96 91 78  Resp: '18  18 16  '$ Temp:      TempSrc:      SpO2: 91% 96% 93% 97%  Weight:      Height:      PainSc:        Isolation Precautions Airborne and Contact precautions  Medications Medications  acetaminophen (TYLENOL) tablet 650 mg (has no administration in time range)    Or  acetaminophen (TYLENOL) suppository 650 mg (has no administration in time range)  ipratropium-albuterol (DUONEB) 0.5-2.5 (3) MG/3ML nebulizer solution 3 mL (3 mLs Nebulization Given 10/05/21 1942)  albuterol (PROVENTIL) (2.5 MG/3ML) 0.083% nebulizer solution 2.5 mg (has no administration in time range)  methocarbamol (ROBAXIN) tablet 500 mg (500 mg Oral Given 10/05/21 0844)  azithromycin (ZITHROMAX) tablet 500 mg (has no administration in time range)  predniSONE (DELTASONE) tablet 40 mg (has no administration in  time range)  ipratropium-albuterol (DUONEB) 0.5-2.5 (3) MG/3ML nebulizer solution 3 mL (3 mLs Nebulization Given 10/05/21 0107)  ipratropium-albuterol (DUONEB) 0.5-2.5 (3) MG/3ML nebulizer solution 3 mL (3 mLs Nebulization Given 10/05/21 0214)  albuterol (PROVENTIL) (2.5 MG/3ML) 0.083% nebulizer solution (10 mg/hr Nebulization Given 10/05/21 0317)  sulfamethoxazole-trimethoprim (BACTRIM DS) 800-160 MG per tablet 1 tablet (1 tablet Oral Given 10/05/21 0336)  sodium chloride 0.9 % bolus 1,000 mL (1,000 mLs Intravenous Bolus 10/05/21 0336)  potassium chloride SA (KLOR-CON M) CR tablet 20 mEq (20 mEq Oral Given 10/05/21 0844)    Mobility walks Low fall risk   Focused Assessments    R Recommendations: See Admitting Provider Note  Report given to:   Additional Notes:

## 2021-10-05 NOTE — Progress Notes (Signed)
  Carryover admission to the Day Admitter.  I discussed this case with the EDP, Silverio Decamp, PA.  Per these discussions:  This is a 51 year old male with history of COPD, who is being admitted for acute COPD exacerbation complicated by acute hypoxic respiratory distress after presenting with 2 days of progressive shortness of breath.   While he carries a diagnosis of COPD, he is not on any scheduled respiratory medications at home, and denies any known baseline supplemental oxygen requirements.  Tried cocaine on the evening of 10/04/2021, but after this for no significant benefit in his shortness of breath, he contacted EMS who noted initial oxygen saturations to be in the low 80s on room air prompting initiation of 6 L nasal cannula with ensuing oxygen saturations improving into the mid to high 90s.  In route to the ED, EMS administered solumedrol, 2 g of IV magnesium sulfate, and duo nebulizer treatment.  Patient denies any associated chest discomfort, including after use of cocaine.   In the ED, supplemental oxygen requirements have decreased to 2 L nasal cannula.  Chest x-ray reportedly showed evidence of bibasilar airspace opacity representing atelectasis versus infiltrate.  He has received 2 additional duo nebulizer treatments in the ED, but remained short of breath.   I have placed an order for admission for acute COPD exacerbation.   I have placed some additional preliminary admit orders via the adult multi-morbid admission order set. I have also ordered solumedrol 80 mg IV twice daily, scheduled nebulizer treatments, as needed albuterol nebulizer, azithromycin. I also added on a procalcitonin level to further assess for underlying pneumonia given finding of bibasilar atelectasis versus infiltrate on presenting chest x-ray.  COVID-19 PCR result pending.    Babs Bertin, DO Hospitalist

## 2021-10-05 NOTE — ED Triage Notes (Signed)
Pt BIB EMS for respiratory distress w/ wheezing in all fields. '125mg'$  of Solu-medrol and 2g of mag sulfate given by EMS. Pt also presents w/ slight delusions. He believes he has bugs in his ear.  16g Left AC  139/95 99% 6L 109 hr NSR

## 2021-10-05 NOTE — Progress Notes (Signed)
[facility] admitting physician addendum:  The patient respiratory status was reevaluated.  He stated that he does not feel as dyspneic with the oxygen, but still feels very congested.  He is still diminished and wheezing on auscultation.  We will continue current management and level of care.  Tennis Must, MD.

## 2021-10-05 NOTE — ED Notes (Signed)
Patient keeps getting reminded that we need a urine specimen. Patient got frustrated with the most recent reminder saying that we do not need his urine for any reason. This nurse explained to patient why it is important for [place] to get a specimen. Patient yelling at this nurse that he is not going to gives [place] a sample and that the doctor needs to "un-order it."

## 2021-10-05 NOTE — H&P (Signed)
History and Physical    Patient: Anthony Finley HXT:[id number] DOB: 25-Sep-1970 DOA: 10/05/2021 DOS: the patient was seen and examined on 10/05/2021 PCP: Patient, No Pcp Per (Inactive)  Patient coming from: Home  Chief Complaint:  Chief Complaint  Patient presents with   Respiratory Distress   HPI: Anthony Finley is a 51 y.o. male with medical history significant of anxiety, asthma, COPD, hyperlipidemia, thrombocytopenia, tobacco abuse, class I obesity who is coming to the emergency department with progressively worse dyspnea associated with wheezing and cough that started about a month ago.  However, he has had significant symptoms over the last 2 days.  He has been having productive cough of whitish or yellowish sputum.He denied fever, chills, rhinorrhea, sore throat or hemoptysis.  Positive pleuritic chest pain, but no palpitations, diaphoresis, PND, orthopnea or pitting edema of the lower extremities.  No abdominal pain, nausea, emesis, diarrhea, constipation, melena or hematochezia.  No flank pain, dysuria, frequency or hematuria.  No polyuria, polydipsia, polyphagia or blurred vision.  He received magnesium sulfate, Solu-Medrol and a DuoNeb from EMS in route to the hospital.  ED course: Initial vital signs were temperature 98.3 F, pulse 94, respiration 19, BP 125/99 mmHg O2 sats 99% on aerosol mask at 8 L/min.  He received 500 mg IVPB of Zithromax, 1000 mL of normal saline, 1 tablet of Bactrim double strength, continuous 10 mg albuterol and DuoNeb x2.  Lab work: Venous blood gas with a PCO2 of 41 mmHg, but otherwise normal.  Troponin x2 negative.  Procalcitonin and coronavirus PCR normal.  CBC showed white count 14.0, hemoglobin 16.0 g/dL platelets 97.  BMP with a glucose of 127 and creatinine 1.32 mg/dL with a GFR more than 60 mL/min.  Imaging: One-view portable chest radiograph with large left greater than right apical bulla.  Hazy atelectasis or mild infiltrate at the  bases.  Review of Systems: As mentioned in the history of present illness. All other systems reviewed and are negative.  Past Medical History:  Diagnosis Date   Anxiety    Asthma    mostly as child-uses inh about q 3-4 months   COPD (chronic obstructive pulmonary disease) (HCC)    Hyperlipidemia    Thrombocytopenia (HCC)    Tobacco abuse    Past Surgical History:  Procedure Laterality Date   FRACTURE SURGERY     orif fx lt ankle age 22   HAND SURGERY Left 08/2015   ORIF ANKLE FRACTURE  02/22/2012   Procedure: OPEN REDUCTION INTERNAL FIXATION (ORIF) ANKLE FRACTURE;  Surgeon: Wylene Simmer, MD;  Location: [facility];  Service: Orthopedics;  Laterality: Left;  Open Reduction Internal Fixation Left Ankle Bimalleolar Fracture;   Open Reduction Internal Fixation of Syndesmosis, and Repair of Deltoid Ligament   PERCUTANEOUS PINNING Left 08/11/2015   Procedure: PINNING OF LEFT 4TH METACARPAL FRACTURE;  Surgeon: Milly Jakob, MD;  Location: [facility];  Service: Orthopedics;  Laterality: Left;   Social History:  reports that he has been smoking cigarettes. He has been smoking an average of .5 packs per day. He has never used smokeless tobacco. He reports current alcohol use. He reports that he does not use drugs.  Allergies  Allergen Reactions   Cephalosporins Anaphylaxis    Hives/ Swelling    Fish-Derived Products Anaphylaxis   Keflex [Cephalexin] Anaphylaxis   Penicillins Anaphylaxis and Hives    Has patient had a PCN reaction causing immediate rash, facial/tongue/throat swelling, SOB or lightheadedness with hypotension: No Has patient had  a PCN reaction causing severe rash involving mucus membranes or skin necrosis: No Has patient had a PCN reaction that required hospitalization: Yes Has patient had a PCN reaction occurring within the last 10 years: Yes If all of the above answers are "NO", then may proceed with Cephalosporin use.     Family History   Problem Relation Age of Onset   Colon polyps Brother    Colon cancer Neg Hx    Esophageal cancer Neg Hx    Rectal cancer Neg Hx    Stomach cancer Neg Hx     Prior to Admission medications   Medication Sig Start Date End Date Taking? Authorizing Provider  diphenhydrAMINE HCl (BENADRYL PO) Take by mouth.   Yes [provider]  albuterol (VENTOLIN HFA) 108 (90 Base) MCG/ACT inhaler Inhale 2 puffs into the lungs every 6 (six) hours as needed for wheezing or shortness of breath. Patient not taking: Reported on 10/05/2021 06/18/20   Kerin Perna, NP  atorvastatin (LIPITOR) 40 MG tablet Take 1 tablet (40 mg total) by mouth daily. Patient not taking: Reported on 10/05/2021 06/19/20   Kerin Perna, NP  fluticasone (FLOVENT HFA) 44 MCG/ACT inhaler Inhale 2 puffs into the lungs in the morning and at bedtime. Patient not taking: Reported on 09/03/2020 06/18/20   Kerin Perna, NP  predniSONE (STERAPRED UNI-PAK 21 TAB) 10 MG (21) TBPK tablet As directed Patient not taking: Reported on 10/05/2021 02/16/21   Raspet, Derry Skill, PA-C  tamsulosin (FLOMAX) 0.4 MG CAPS capsule Take 1 capsule (0.4 mg total) by mouth daily. Patient not taking: Reported on 09/03/2020 06/19/20   Kerin Perna, NP    Physical Exam: Vitals:   10/05/21 0330 10/05/21 0345 10/05/21 0400 10/05/21 0735  BP: 107/85 128/90 128/75 126/80  Pulse: 85 92 90 92  Resp: '19 15 12 '$ (!) 22  Temp:      TempSrc:      SpO2: 92% 98% 100% 92%  Weight:      Height:       Physical Exam Vitals and nursing note reviewed.  Constitutional:      General: He is not in acute distress.    Appearance: He is obese.  HENT:     Head: Normocephalic.     Mouth/Throat:     Mouth: Mucous membranes are moist.     Pharynx: Oropharynx is clear. No oropharyngeal exudate or posterior oropharyngeal erythema.  Eyes:     General: No scleral icterus.    Pupils: Pupils are equal, round, and reactive to light.  Neck:     Vascular: No JVD.   Cardiovascular:     Rate and Rhythm: Normal rate and regular rhythm.     Heart sounds: S1 normal and S2 normal.  Pulmonary:     Effort: No tachypnea or accessory muscle usage.     Breath sounds: Decreased breath sounds, wheezing and rhonchi present. No rales.  Abdominal:     General: Bowel sounds are normal. There is no distension.     Palpations: Abdomen is soft.     Tenderness: There is no abdominal tenderness.  Musculoskeletal:     Cervical back: Neck supple.     Right lower leg: No edema.     Left lower leg: No edema.  Skin:    General: Skin is warm and dry.  Neurological:     General: No focal deficit present.     Mental Status: He is alert and oriented to person, place, and time.  Psychiatric:        Speech: Speech normal.    Data Reviewed:  There are no new results to review at this time.  Assessment and Plan: Principal Problem:   Acute exacerbation of  chronic obstructive pulmonary disease (COPD) ([place]) Observation/PCU. Continue supplemental oxygen. Methylprednisolone 125 mg IVP x1. Followed by prednisone 40 mg p.o. daily in a.m. Scheduled and as needed bronchodilators. Follow-up CBC and chemistry in the morning.   Active Problems:   Tobacco abuse Smoking cessation advised. Declined nicotine replacement therapy.    Thrombocytopenia (HCC) Monitor platelet count.    Leukocytosis Received Solu-Medrol PTA. Monitor white blood cell count.    Hyperglycemia Nonfasting level. Repeat in AM.    Elevated serum creatinine GFR was normal. Monitor creatinine level.    Class 1 obesity Lifestyle modifications. Needs to establish with primary care provider     Advance Care Planning:   Code Status: Full Code   Consults:   Family Communication:   Severity of Illness: The appropriate patient status for this patient is OBSERVATION. Observation status is judged to be reasonable and necessary in order to provide the required intensity of service to ensure the  patient's safety. The patient's presenting symptoms, physical exam findings, and initial radiographic and laboratory data in the context of their medical condition is felt to place them at decreased risk for further clinical deterioration. Furthermore, it is anticipated that the patient will be medically stable for discharge from the hospital within 2 midnights of admission.   Author: Reubin Milan, MD 10/05/2021 8:20 AM  For on call review [email or web address].   This document was prepared using Dragon voice recognition software and may contain some unintended transcription errors.

## 2021-10-05 NOTE — ED Provider Notes (Addendum)
[facility] DEPT Provider Note   CSN: [id number] Arrival date & time: 10/05/21  0036     History Chief Complaint  Patient presents with   Respiratory Distress    Anthony Finley is a 51 y.o. male  with history of COPD currently smoking half pack per day who presents via EMS concern for respiratory distress.  EMS was called by his son.  Patient is not on any medications daily for his COPD.  Was hypoxic in route requiring 6 L by nasal cannula to maintain sats greater than 90%.  Received 125 of Solu-Medrol, 2 g of mag, and a single DuoNeb in route. Chest tightness and SOB started yesterday, some improvement in breathing with albuterol at home.   Level 5 caveat due to current intoxication, patient endorsing cocaine use this evening.  Patient is also endorsing central chest pain rated 6/10 intermittently in context of cocaine use at 11 pm.  Says no pain at this time.  He also believes he has bugs in his ears.  I personally reviewed his medical records.  In addition to the above listed he has history of anxiety and hyperlipidemia.  He is not currently on any medications daily.  He has been admitted to the hospital in the past for COPD exacerbation but per patient has never required intubation.  HPI     Home Medications Prior to Admission medications   Medication Sig Start Date End Date Taking? Authorizing Provider  diphenhydrAMINE HCl (BENADRYL PO) Take by mouth.   Yes [provider]  albuterol (VENTOLIN HFA) 108 (90 Base) MCG/ACT inhaler Inhale 2 puffs into the lungs every 6 (six) hours as needed for wheezing or shortness of breath. Patient not taking: Reported on 10/05/2021 06/18/20   Kerin Perna, NP  atorvastatin (LIPITOR) 40 MG tablet Take 1 tablet (40 mg total) by mouth daily. Patient not taking: Reported on 10/05/2021 06/19/20   Kerin Perna, NP  fluticasone (FLOVENT HFA) 44 MCG/ACT inhaler Inhale 2 puffs into the lungs in the  morning and at bedtime. Patient not taking: Reported on 09/03/2020 06/18/20   Kerin Perna, NP  predniSONE (STERAPRED UNI-PAK 21 TAB) 10 MG (21) TBPK tablet As directed Patient not taking: Reported on 10/05/2021 02/16/21   Raspet, Derry Skill, PA-C  tamsulosin (FLOMAX) 0.4 MG CAPS capsule Take 1 capsule (0.4 mg total) by mouth daily. Patient not taking: Reported on 09/03/2020 06/19/20   Kerin Perna, NP      Allergies    Cephalosporins, Fish-derived products, Keflex [cephalexin], and Penicillins    Review of Systems   Review of Systems  Constitutional: Negative.   HENT: Negative.    Respiratory:  Positive for cough, chest tightness and shortness of breath.   Cardiovascular:  Positive for chest pain.  Gastrointestinal: Negative.    Physical Exam Updated Vital Signs BP 128/75   Pulse 90   Temp 98.3 F (36.8 C) (Oral)   Resp 12   Ht 6' (1.829 m)   Wt 106.6 kg   SpO2 100%   BMI 31.87 kg/m  Physical Exam Vitals and nursing note reviewed.  Constitutional:      General: He is in acute distress.     Appearance: He is not ill-appearing or toxic-appearing.  HENT:     Head: Normocephalic and atraumatic.     Nose: Nose normal.     Mouth/Throat:     Mouth: Mucous membranes are moist.     Pharynx: No oropharyngeal exudate or posterior  oropharyngeal erythema.  Eyes:     General:        Right eye: No discharge.        Left eye: No discharge.     Extraocular Movements: Extraocular movements intact.     Conjunctiva/sclera: Conjunctivae normal.     Pupils: Pupils are equal, round, and reactive to light.  Cardiovascular:     Rate and Rhythm: Normal rate and regular rhythm.     Pulses: Normal pulses.     Heart sounds: Normal heart sounds. No murmur heard. Pulmonary:     Effort: Tachypnea and prolonged expiration present. No accessory muscle usage or respiratory distress.     Breath sounds: Decreased air movement present. Examination of the right-upper field reveals wheezing.  Examination of the right-middle field reveals wheezing. Examination of the left-middle field reveals wheezing. Examination of the right-lower field reveals wheezing. Examination of the left-lower field reveals wheezing. Wheezing present. No rales.     Comments: O2 sat 87% - 91% in RA.  Abdominal:     General: Bowel sounds are normal. There is no distension.     Palpations: Abdomen is soft.     Tenderness: There is no abdominal tenderness. There is no guarding or rebound.  Musculoskeletal:        General: No deformity.     Cervical back: Neck supple.     Right lower leg: No edema.     Left lower leg: No edema.  Skin:    General: Skin is warm and dry.       Neurological:     General: No focal deficit present.     Mental Status: He is alert and oriented to person, place, and time. Mental status is at baseline.  Psychiatric:        Mood and Affect: Mood normal.    ED Results / Procedures / Treatments   Labs (all labs ordered are listed, but only abnormal results are displayed) Labs Reviewed  BASIC METABOLIC PANEL - Abnormal; Notable for the following components:      Result Value   Glucose, Bld 127 (*)    Creatinine, Ser 1.32 (*)    All other components within normal limits  CBC - Abnormal; Notable for the following components:   WBC 14.0 (*)    Platelets 97 (*)    All other components within normal limits  BLOOD GAS, VENOUS - Abnormal; Notable for the following components:   pCO2, Ven 41 (*)    All other components within normal limits  SARS CORONAVIRUS 2 BY RT PCR  ETHANOL  RAPID URINE DRUG SCREEN, HOSP PERFORMED  PROCALCITONIN  TROPONIN I (HIGH SENSITIVITY)  TROPONIN I (HIGH SENSITIVITY)    EKG EKG Interpretation  Date/Time:  Monday October 05 2021 00:56:41 EDT Ventricular Rate:  101 PR Interval:  136 QRS Duration: 72 QT Interval:  346 QTC Calculation: 449 R Axis:   39 Text Interpretation: Sinus tachycardia Borderline low voltage, extremity leads When compared with  ECG of 08/23/2018, No significant change was found Confirmed by Delora Fuel ([id number]) on 10/05/2021 1:01:31 AM  Radiology DG Chest Port 1 View  Result Date: 10/05/2021 CLINICAL DATA:  Respiratory distress and wheezing EXAM: PORTABLE CHEST 1 VIEW COMPARISON:  08/23/2018, 08/05/2018 FINDINGS: Large left greater than right apical bulla. Hazy bibasilar opacities. Normal cardiac size. No pleural effusion. No definitive pneumothorax. IMPRESSION: 1. Large left greater than right apical bulla 2. Hazy atelectasis or mild infiltrate at the bases Electronically Signed   By: Maudie Mercury  Francoise Ceo M.D.   On: 10/05/2021 01:22    Procedures Procedures    Medications Ordered in ED Medications  acetaminophen (TYLENOL) tablet 650 mg (has no administration in time range)    Or  acetaminophen (TYLENOL) suppository 650 mg (has no administration in time range)  methylPREDNISolone sodium succinate (SOLU-MEDROL) 125 mg/2 mL injection 80 mg (has no administration in time range)  ipratropium-albuterol (DUONEB) 0.5-2.5 (3) MG/3ML nebulizer solution 3 mL (has no administration in time range)  albuterol (PROVENTIL) (2.5 MG/3ML) 0.083% nebulizer solution 2.5 mg (has no administration in time range)  azithromycin (ZITHROMAX) 500 mg in sodium chloride 0.9 % 250 mL IVPB (has no administration in time range)  ipratropium-albuterol (DUONEB) 0.5-2.5 (3) MG/3ML nebulizer solution 3 mL (3 mLs Nebulization Given 10/05/21 0107)  ipratropium-albuterol (DUONEB) 0.5-2.5 (3) MG/3ML nebulizer solution 3 mL (3 mLs Nebulization Given 10/05/21 0214)  albuterol (PROVENTIL) (2.5 MG/3ML) 0.083% nebulizer solution (10 mg/hr Nebulization Given 10/05/21 0317)  sulfamethoxazole-trimethoprim (BACTRIM DS) 800-160 MG per tablet 1 tablet (1 tablet Oral Given 10/05/21 0336)  sodium chloride 0.9 % bolus 1,000 mL (1,000 mLs Intravenous Bolus 10/05/21 0336)    ED Course/ Medical Decision Making/ A&P Clinical Course as of 10/05/21 0541  Mon Oct 05, 2021  0205 Reevaluated  and still wheezing with increased WOB and tachypnea after second duoneb. Will administer third.   [RS]  0310 Reevaluated after third duoneb, still with increased WOB, wheezing throughout and desats to the low 80s, requiring 2L O2 by [place] to maintain sats >90. [RS]  [phone] Patient continues to require 2 L supplemental oxygen by nasal cannula to maintain his saturations greater than 90%.  Tachypneic with persistent wheezing throughout though significantly improved from prior.  Given ongoing wheezing and requirement for oxygen new from his baseline, do feel patient would benefit from admission to the hospital for further management of his COPD exacerbation. [RS]  [phone] Called to Dr. Velia Meyer, hospitalist, who is agreeable to admitting this patient to his service.  Appreciate his collaboration in care of this patient [RS]    Clinical Course User Index [RS] Elnor Renovato, Gypsy Balsam, PA-C                           Medical Decision Making 51 year old male presents in respiratory distress .  Hypoxic to theDays on intake, hypertensive.  Presents otherwise normal.  Cardiopulmonary exam as above with decreased air movement throughout as well as wheezing, prolonged expiratory phase.  Differential diagnosis includes was limited due to COPD exacerbation, CHF, ACS.  We will proceed with second DuoNeb and reevaluate.   Amount and/or Complexity of Data Reviewed Labs: ordered.    Details: CBC with leukocytosis of 14,000, BMP with elevated creatinine to 1.3 mildly elevated from patient's baseline of 1.09.  VBG is normal, ethanol level is negative, troponin is negative x2, flat at 7.  UDS pending Radiology: ordered.    Details: Image visualized by this provider.  Favor atelectasis in the bases rather than new infiltrate clinically.  Risk Prescription drug management. Decision regarding hospitalization.   Patient reevaluated after multiple breathing treatments as above with persistent wheezing, tachypnea, and  oxygen requirement which is new from his baseline.  Still requiring 2 L supplemental oxygen by nasal cannula to maintain sats greater than 90%.  In regard to the skin changes in his left axilla, small pustules suggestive of early tubulitis without large abscess requiring drainage.  First dose of Bactrim administered in the ED.  In regard  to cocaine use tonight, CP free at this time with nonischemic EKG and normal troponin.   Given clinical picture of COPD exacerbation with new oxygen requirement do feel patient would benefit from admission to the hospital.  Consult to hospitalist as above.  [place]  voiced understanding of his medical evaluation and treatment plan. Each of their questions answered to their expressed satisfaction.  He is amenable to plan for admission at this time.   This chart was dictated using voice recognition software, Dragon. Despite the best efforts of this provider to proofread and correct errors, errors may still occur which can change documentation meaning.           Final Clinical Impression(s) / ED Diagnoses Final diagnoses:  COPD exacerbation [facility])    Rx / DC Orders ED Discharge Orders     None        Aura Dials 97/28/20 6015    Delora Fuel, MD 61/53/79 [phone]

## 2021-10-05 NOTE — ED Notes (Signed)
Patient keeps taking his nasal cannula off and keeps being reminded that he needs to keep his nasal cannula on because his oxygen levels are low. This nurse explained to him the importance of keeping his oxygen on to keep his oxygen saturations up.

## 2021-10-05 NOTE — ED Notes (Signed)
Pt's oxygen level dropped to 87% on room air. Pt placed on 3L of oxygen by face mask and PA made aware. Duoneb nebulizer ordered.

## 2021-10-06 DIAGNOSIS — D696 Thrombocytopenia, unspecified: Secondary | ICD-10-CM

## 2021-10-06 DIAGNOSIS — R739 Hyperglycemia, unspecified: Secondary | ICD-10-CM

## 2021-10-06 DIAGNOSIS — D72829 Elevated white blood cell count, unspecified: Secondary | ICD-10-CM

## 2021-10-06 DIAGNOSIS — J441 Chronic obstructive pulmonary disease with (acute) exacerbation: Secondary | ICD-10-CM | POA: Diagnosis not present

## 2021-10-06 DIAGNOSIS — J9601 Acute respiratory failure with hypoxia: Secondary | ICD-10-CM

## 2021-10-06 LAB — CBC
HCT: 41.8 % (ref 39.0–52.0)
Hemoglobin: 14.5 g/dL (ref 13.0–17.0)
MCH: 31.4 pg (ref 26.0–34.0)
MCHC: 34.7 g/dL (ref 30.0–36.0)
MCV: 90.5 fL (ref 80.0–100.0)
Platelets: 116 10*3/uL — ABNORMAL LOW (ref 150–400)
RBC: 4.62 MIL/uL (ref 4.22–5.81)
RDW: 13 % (ref 11.5–15.5)
WBC: 20.3 10*3/uL — ABNORMAL HIGH (ref 4.0–10.5)
nRBC: 0 % (ref 0.0–0.2)

## 2021-10-06 LAB — RESPIRATORY PANEL BY PCR

## 2021-10-06 LAB — BASIC METABOLIC PANEL
Anion gap: 7 (ref 5–15)
BUN: 14 mg/dL (ref 6–20)
CO2: 27 mmol/L (ref 22–32)
Calcium: 9.1 mg/dL (ref 8.9–10.3)
Chloride: 105 mmol/L (ref 98–111)
Creatinine, Ser: 1.23 mg/dL (ref 0.61–1.24)
GFR, Estimated: >60 mL/min (ref >60)
Glucose, Bld: 149 mg/dL — ABNORMAL HIGH (ref 70–99)
Potassium: 3.6 mmol/L (ref 3.5–5.1)
Sodium: 139 mmol/L (ref 135–145)

## 2021-10-06 MED ORDER — BUDESONIDE 0.25 MG/2ML IN SUSP
0.2500 mg | Freq: Two times a day (BID) | RESPIRATORY_TRACT | Status: DC
  Administered 2021-10-06 – 2021-10-07 (×3): 0.25 mg via RESPIRATORY_TRACT
  Filled 2021-10-06 (×3): qty 2

## 2021-10-06 MED ORDER — METHYLPREDNISOLONE SODIUM SUCC 125 MG IJ SOLR
60.0000 mg | Freq: Two times a day (BID) | INTRAMUSCULAR | Status: DC
  Administered 2021-10-06 – 2021-10-07 (×3): 60 mg via INTRAVENOUS
  Filled 2021-10-06 (×3): qty 2

## 2021-10-06 MED ORDER — GUAIFENESIN ER 600 MG PO TB12
1200.0000 mg | ORAL_TABLET | Freq: Two times a day (BID) | ORAL | Status: DC
  Administered 2021-10-06 – 2021-10-07 (×3): 1200 mg via ORAL
  Filled 2021-10-06 (×3): qty 2

## 2021-10-06 MED ORDER — ARFORMOTEROL TARTRATE 15 MCG/2ML IN NEBU
15.0000 ug | INHALATION_SOLUTION | Freq: Two times a day (BID) | RESPIRATORY_TRACT | Status: DC
  Administered 2021-10-06 – 2021-10-07 (×3): 15 ug via RESPIRATORY_TRACT
  Filled 2021-10-06 (×3): qty 2

## 2021-10-06 MED ORDER — SODIUM CHLORIDE 0.9 % IV BOLUS
500.0000 mL | Freq: Once | INTRAVENOUS | Status: AC
  Administered 2021-10-06: 500 mL via INTRAVENOUS

## 2021-10-06 NOTE — TOC Initial Note (Signed)
Transition of Care [facility]) - Initial/Assessment Note    Patient Details  Name: Anthony Finley MRN: [id number] Date of Birth: [birth year]  Transition of Care [facility]) CM/SW Contact:    Leeroy Cha, RN Phone Number: 10/06/2021, 9:09 AM  Clinical Narrative:                  Transition of Care [facility]) Screening Note   Patient Details  Name: Anthony Finley Date of Birth: Aug 01, 1970   Transition of [facility]) CM/SW Contact:    Leeroy Cha, RN Phone Number: 10/06/2021, 9:09 AM    Transition of Care Department [facility]) has reviewed patient and no TOC needs have been identified at this time. We will continue to monitor patient advancement through interdisciplinary progression rounds. If new patient transition needs arise, please place a TOC consult.    Expected Discharge Plan: Home/Self Care Barriers to Discharge: Continued Medical Work up   Patient Goals and CMS Choice Patient states their goals for this hospitalization and ongoing recovery are:: to go home CMS Medicare.gov Compare Post Acute Care list provided to:: Patient    Expected Discharge Plan and Services Expected Discharge Plan: Home/Self Care   Discharge Planning Services: CM Consult   Living arrangements for the past 2 months: Single Family Home                                      Prior Living Arrangements/Services Living arrangements for the past 2 months: Single Family Home Lives with:: Self Patient language and need for interpreter reviewed:: Yes Do you feel safe going back to the place where you live?: Yes            Criminal Activity/Legal Involvement Pertinent to Current Situation/Hospitalization: No - Comment as needed  Activities of Daily Living      Permission Sought/Granted                  Emotional Assessment Appearance:: Appears stated age     Orientation: : Oriented to Self, Oriented to Place, Oriented to  Time, Oriented to Situation Alcohol / Substance  Use: Not Applicable Psych Involvement: No (comment)  Admission diagnosis:  Acute exacerbation of chronic obstructive pulmonary disease (COPD) ([place]) [J44.1] COPD exacerbation ([place]) [J44.1] Patient Active Problem List   Diagnosis Date Noted   Acute exacerbation of chronic obstructive pulmonary disease (COPD) ([place]) 10/05/2021   Hyperglycemia 10/05/2021   Elevated serum creatinine 10/05/2021   Class 1 obesity 10/05/2021   Leukocytosis 10/05/2021   Left leg pain 03/17/2021   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   Asthma with COPD with exacerbation ([place]) 10/18/2017   Ischemic chest pain ([place]) 10/18/2017   Tobacco abuse 10/18/2017   Thrombocytopenia ([place]) 10/18/2017   PCP:  Patient, No Pcp Per (Inactive) Pharmacy:   [facility] ([place]), [place] - [address] [place] [address] Phone: [phone] Fax: [phone]     Social Determinants of Health (SDOH) Interventions    Readmission Risk Interventions     View : No data to display.

## 2021-10-06 NOTE — Progress Notes (Signed)
PT demonstrated hands on understanding of [place] at this time.

## 2021-10-06 NOTE — Progress Notes (Signed)
PROGRESS NOTE    Anthony Finley  NWG:[id number] DOB: Aug 02, 1970 DOA: 10/05/2021 PCP: Patient, No Pcp Per (Inactive)   Brief Narrative:  The patient is a 51 year old African-American male with a past medical history significant for but limited to anxiety, asthma, COPD, hyperlipidemia, thrombocytopenia, history of tobacco abuse as well as obesity who came in with progressively worsening dyspnea associated with wheezing and cough that started about a month ago and has not really improved.  He had significant symptoms the last few days and has had a productive cough with gray sputum.  He had some pleuritic chest pain but no palpitations, PND, orthopnea, pitting edema or lower extremity swelling.  Denies any nausea or vomiting.  In the ED he was given Solu-Medrol, magnesium sulfate, DuoNeb.  Initial vital signs were drawn and he was saturating 99% on aerosol mask at 8 L.  He received IV Zithromax, a liter normal same bolus and placed on Bactrim double strength and given a continuous albuterol neb and DuoNeb's x2.  Procalcitonin level and coronavirus PCR was negative.  Chest x-ray done and showed hazy atelectasis or mild infiltrate at the bases and he had a 1 view with a large left greater than right apical bulla.  He was admitted for acute respiratory failure with hypoxia in the setting of acute exacerbation of chronic pulmonary obstructive disease associated with wheezing.  He is slowly improving but continues to have symptoms so we will change his prednisone to IV Solu-Medrol again.   Assessment and Plan: No notes have been filed under this hospital service. Service: Hospitalist   Acute respiratory failure with hypoxia in the setting of acute exacerbation of COPD -Placed in observation and changed to inpatient -Continue supplemental oxygen via nasal cannula wean O2 as tolerated -Continuous pulse oximetry maintain O2 sats greater than 90% -SpO2: 97 % O2 Flow Rate (L/min): 3 L/min -Oxygen  requirement is weaning and he was on 8 L previously -Initially placed on Solu-Medrol 1 two 5 mg x 1 and now was changed to prednisone 40 g p.o. daily but will go back to Solu-Medrol 60 mg every 12 given his continued wheezing -Continue scheduled and as needed bronchodilators and also have added Brovana and budesonide -Added flutter valve, incentive spirometry, as well as guaifenesin -We will need an ambulatory home O2 screen and repeat chest x-ray in a.m. -WBC went from 44 0 is now 20.3 in the setting of steroid demargination -Procalcitonin level less than 0.10 -Check acute respiratory virus panel  Tobacco abuse -Smoking cessation advised. -Declined nicotine replacement therapy.   Thrombocytopenia (HCC) -Platelet count went from 97 is now trended up to 116 -Monitor for signs and symptoms bleeding; no overt bleeding noted -Repeat CBC in a.m.     Leukocytosis -Received Solu-Medrol PTA. -Continue monitor for signs and symptoms infection; currently no infection noted and his PCT is less than 0.10 -Continue monitor and trend and repeat CBC  Cocaine positive -UDS positive for cocaine -Counseling given   Hyperglycemia in the setting of steroid margination -Nonfasting level. -Check hemoglobin A1c and trend blood sugars carefully given that he is getting steroids   Elevated serum creatinine -GFR was normal. -Monitor creatinine level. -Patient's BUNs/creatinine went from 13/1.32 is now 57/8.46   Obesity -Complicates overall prognosis and care -Estimated body mass index is 31.96 kg/m as calculated from the following:   Height as of this encounter: 6' (1.829 m).   Weight as of this encounter: 106.9 kg.  -Weight Loss and Dietary Counseling given   DVT  prophylaxis: SCDs Start: 10/05/21 0520    Code Status: Full Code Family Communication: No family currently at bedside  Disposition Plan:  Level of care: Progressive Status is: Inpatient Remains inpatient appropriate because:  Pending further clinical improvement and he still feels weak and still has a significant cough and wheezing   Consultants:  None  Procedures:  None  Antimicrobials:  Anti-infectives (From admission, onward)    Start     Dose/Rate Route Frequency Ordered Stop   10/06/21 1000  azithromycin (ZITHROMAX) tablet 500 mg        500 mg Oral Daily 10/05/21 0916 10/10/21 0959   10/05/21 0600  azithromycin (ZITHROMAX) 500 mg in sodium chloride 0.9 % 250 mL IVPB  Status:  Discontinued       Note to Pharmacy: (In setting of acute copd exac)   500 mg 250 mL/hr over 60 Minutes Intravenous Every 24 hours 10/05/21 0521 10/05/21 0916   10/05/21 0315  sulfamethoxazole-trimethoprim (BACTRIM DS) 800-160 MG per tablet 1 tablet        1 tablet Oral  Once 10/05/21 0309 10/05/21 0336       Subjective: Seen and examined at bedside and he was resting and still felt a little short of breath and had some wheezing and productive gray sputum.  Thinks he is doing a little bit better and denies any lightheadedness or dizziness.  Objective: Vitals:   10/06/21 0838 10/06/21 0900 10/06/21 0949 10/06/21 1402  BP:  (!) 91/39 98/69 117/74  Pulse:  83  86  Resp:  18  18  Temp:  97.8 F (36.6 C)  97.9 F (36.6 C)  TempSrc:  Oral  Oral  SpO2: 93% 96%  97%  Weight:      Height:        Intake/Output Summary (Last 24 hours) at 10/06/2021 1430 Last data filed at 10/06/2021 0900 Gross per 24 hour  Intake 200 ml  Output --  Net 200 ml   Filed Weights   10/05/21 0048 10/06/21 0500  Weight: 106.6 kg 106.9 kg   Examination: Physical Exam:  Constitutional: WN/WD obese African-American male currently no acute distress appears calm Respiratory: Diminished to auscultation bilaterally with coarse breath sounds and some bilateral expiratory wheezing and some some slight rhonchi, no rales, or crackles. Normal respiratory effort and patient is not tachypenic. No accessory muscle use.  Unlabored breathing and was wearing 3 L  of supplemental oxygen via nasal cannula Cardiovascular: RRR, no murmurs / rubs / gallops. S1 and S2 auscultated.  Trace extremity edema Abdomen: Soft, non-tender, distended secondary body habitus Bowel sounds positive.  GU: Deferred. Musculoskeletal: No clubbing / cyanosis of digits/nails. No joint deformity upper and lower extremities. Skin: No rashes, lesions, ulcers on limited skin evaluation. No induration; Warm and dry.  Neurologic: CN 2-12 grossly intact with no focal deficits. Romberg sign and cerebellar reflexes not assessed.  Psychiatric: Normal judgment and insight. Alert and oriented x 3. Normal mood and appropriate affect.   Data Reviewed: I have personally reviewed following labs and imaging studies  CBC: Recent Labs  Lab 10/05/21 0058 10/06/21 0413  WBC 14.0* 20.3*  HGB 16.0 14.5  HCT 45.3 41.8  MCV 89.3 90.5  PLT 97* 384*   Basic Metabolic Panel: Recent Labs  Lab 10/05/21 0058 10/06/21 0413  NA 137 139  K 3.8 3.6  CL 104 105  CO2 25 27  GLUCOSE 127* 149*  BUN 13 14  CREATININE 1.32* 1.23  CALCIUM 9.5 9.1   GFR:  Estimated Creatinine Clearance: 89.7 mL/min (by C-G formula based on SCr of 1.23 mg/dL). Liver Function Tests: No results for input(s): AST, ALT, ALKPHOS, BILITOT, PROT, ALBUMIN in the last 168 hours. No results for input(s): LIPASE, AMYLASE in the last 168 hours. No results for input(s): AMMONIA in the last 168 hours. Coagulation Profile: No results for input(s): INR, PROTIME in the last 168 hours. Cardiac Enzymes: No results for input(s): CKTOTAL, CKMB, CKMBINDEX, TROPONINI in the last 168 hours. BNP (last 3 results) No results for input(s): PROBNP in the last 8760 hours. HbA1C: No results for input(s): HGBA1C in the last 72 hours. CBG: No results for input(s): GLUCAP in the last 168 hours. Lipid Profile: No results for input(s): CHOL, HDL, LDLCALC, TRIG, CHOLHDL, LDLDIRECT in the last 72 hours. Thyroid Function Tests: No results for  input(s): TSH, T4TOTAL, FREET4, T3FREE, THYROIDAB in the last 72 hours. Anemia Panel: No results for input(s): VITAMINB12, FOLATE, FERRITIN, TIBC, IRON, RETICCTPCT in the last 72 hours. Sepsis Labs: Recent Labs  Lab 10/05/21 0058  PROCALCITON <0.10    Recent Results (from the past 240 hour(s))  SARS Coronavirus 2 by RT PCR (hospital order, performed in [facility] hospital lab) *cepheid single result test* Anterior Nasal Swab     Status: None   Collection Time: 10/05/21  6:21 AM   Specimen: Anterior Nasal Swab  Result Value Ref Range Status   SARS Coronavirus 2 by RT PCR NEGATIVE NEGATIVE Final    Comment: (NOTE) SARS-CoV-2 target nucleic acids are NOT DETECTED.  The SARS-CoV-2 RNA is generally detectable in upper and lower respiratory specimens during the acute phase of infection. The lowest concentration of SARS-CoV-2 viral copies this assay can detect is 250 copies / mL. A negative result does not preclude SARS-CoV-2 infection and should not be used as the sole basis for treatment or other patient management decisions.  A negative result may occur with improper specimen collection / handling, submission of specimen other than nasopharyngeal swab, presence of viral mutation(s) within the areas targeted by this assay, and inadequate number of viral copies (<250 copies / mL). A negative result must be combined with clinical observations, patient history, and epidemiological information.  Fact Sheet for Patients:   https://www.patel.info/  Fact Sheet for Healthcare Providers: https://hall.com/  This test is not yet approved or  cleared by the [place] FDA and has been authorized for detection and/or diagnosis of SARS-CoV-2 by FDA under an Emergency Use Authorization (EUA).  This EUA will remain in effect (meaning this test can be used) for the duration of the COVID-19 declaration under Section 564(b)(1) of the Act, 21  U.S.C. section 360bbb-3(b)(1), unless the authorization is terminated or revoked sooner.  Performed at [facility], [place] [address] [place], [place] [address]      Radiology Studies: [facility] Chest Port 1 View  Result Date: 10/05/2021 CLINICAL DATA:  Respiratory distress and wheezing EXAM: PORTABLE CHEST 1 VIEW COMPARISON:  08/23/2018, 08/05/2018 FINDINGS: Large left greater than right apical bulla. Hazy bibasilar opacities. Normal cardiac size. No pleural effusion. No definitive pneumothorax. IMPRESSION: 1. Large left greater than right apical bulla 2. Hazy atelectasis or mild infiltrate at the bases Electronically Signed   By: Donavan Foil M.D.   On: 10/05/2021 01:22     Scheduled Meds:  arformoterol  15 mcg Nebulization BID   azithromycin  500 mg Oral Daily   budesonide (PULMICORT) nebulizer solution  0.25 mg Nebulization BID   guaiFENesin  1,200 mg Oral BID  ipratropium-albuterol  3 mL Nebulization Q6H   methylPREDNISolone (SOLU-MEDROL) injection  60 mg Intravenous Q12H   Continuous Infusions:   LOS: 1 day   Raiford Noble, DO [facility] Available via Epic secure chat 7am-7pm After these hours, please refer to coverage provider listed on [email or web address] 10/06/2021, 2:30 PM

## 2021-10-06 NOTE — Progress Notes (Signed)
SATURATION QUALIFICATIONS: (This note is used to comply with regulatory documentation for home oxygen)  Patient Saturations on Room Air at Rest = 98%  Patient Saturations on Room Air while Ambulating = 98-99%   Please briefly explain why patient needs home oxygen: -Patient does not need home oxygen.

## 2021-10-06 NOTE — Plan of Care (Signed)

## 2021-10-07 ENCOUNTER — Inpatient Hospital Stay (HOSPITAL_COMMUNITY): Payer: 59

## 2021-10-07 DIAGNOSIS — J45901 Unspecified asthma with (acute) exacerbation: Secondary | ICD-10-CM

## 2021-10-07 DIAGNOSIS — E669 Obesity, unspecified: Secondary | ICD-10-CM | POA: Diagnosis not present

## 2021-10-07 DIAGNOSIS — J441 Chronic obstructive pulmonary disease with (acute) exacerbation: Secondary | ICD-10-CM | POA: Diagnosis not present

## 2021-10-07 LAB — CBC WITH DIFFERENTIAL/PLATELET
Abs Immature Granulocytes: 0.29 10*3/uL — ABNORMAL HIGH (ref 0.00–0.07)
Basophils Absolute: 0 10*3/uL (ref 0.0–0.1)
Basophils Relative: 0 %
Eosinophils Absolute: 0 10*3/uL (ref 0.0–0.5)
Eosinophils Relative: 0 %
HCT: 45.4 % (ref 39.0–52.0)
Hemoglobin: 15.2 g/dL (ref 13.0–17.0)
Immature Granulocytes: 1 %
Lymphocytes Relative: 4 %
Lymphs Abs: 0.9 10*3/uL (ref 0.7–4.0)
MCH: 30.5 pg (ref 26.0–34.0)
MCHC: 33.5 g/dL (ref 30.0–36.0)
MCV: 91.2 fL (ref 80.0–100.0)
Monocytes Absolute: 0.8 10*3/uL (ref 0.1–1.0)
Monocytes Relative: 4 %
Neutro Abs: 20 10*3/uL — ABNORMAL HIGH (ref 1.7–7.7)
Neutrophils Relative %: 91 %
Platelets: 138 10*3/uL — ABNORMAL LOW (ref 150–400)
RBC: 4.98 MIL/uL (ref 4.22–5.81)
RDW: 12.8 % (ref 11.5–15.5)
WBC: 21.9 10*3/uL — ABNORMAL HIGH (ref 4.0–10.5)
nRBC: 0 % (ref 0.0–0.2)

## 2021-10-07 LAB — COMPREHENSIVE METABOLIC PANEL
ALT: 31 U/L (ref 0–44)
AST: 18 U/L (ref 15–41)
Albumin: 3.5 g/dL (ref 3.5–5.0)
Alkaline Phosphatase: 46 U/L (ref 38–126)
Anion gap: 6 (ref 5–15)
BUN: 18 mg/dL (ref 6–20)
CO2: 27 mmol/L (ref 22–32)
Calcium: 9.6 mg/dL (ref 8.9–10.3)
Chloride: 104 mmol/L (ref 98–111)
Creatinine, Ser: 1.07 mg/dL (ref 0.61–1.24)
GFR, Estimated: >60 mL/min (ref >60)
Glucose, Bld: 223 mg/dL — ABNORMAL HIGH (ref 70–99)
Potassium: 4.6 mmol/L (ref 3.5–5.1)
Sodium: 137 mmol/L (ref 135–145)
Total Bilirubin: 0.4 mg/dL (ref 0.3–1.2)
Total Protein: 6.8 g/dL (ref 6.5–8.1)

## 2021-10-07 LAB — MAGNESIUM: Magnesium: 2.3 mg/dL (ref 1.7–2.4)

## 2021-10-07 LAB — PHOSPHORUS: Phosphorus: 3 mg/dL (ref 2.5–4.6)

## 2021-10-07 MED ORDER — NICOTINE 21 MG/24HR TD PT24: 21.0000 mg | MEDICATED_PATCH | TRANSDERMAL | 0 refills | Status: AC

## 2021-10-07 MED ORDER — PREDNISONE 10 MG PO TABS: ORAL_TABLET | ORAL | 0 refills | Status: AC

## 2021-10-07 MED ORDER — FLUTICASONE PROPIONATE HFA 44 MCG/ACT IN AERO: 2.0000 | INHALATION_SPRAY | Freq: Two times a day (BID) | RESPIRATORY_TRACT | 0 refills | Status: DC

## 2021-10-07 MED ORDER — IPRATROPIUM-ALBUTEROL 0.5-2.5 (3) MG/3ML IN SOLN
3.0000 mL | Freq: Three times a day (TID) | RESPIRATORY_TRACT | Status: DC
  Administered 2021-10-07: 3 mL via RESPIRATORY_TRACT
  Filled 2021-10-07: qty 3

## 2021-10-07 MED ORDER — AZITHROMYCIN 250 MG PO TABS: 500.0000 mg | ORAL_TABLET | Freq: Every day | ORAL | 0 refills | Status: AC

## 2021-10-07 MED ORDER — ALBUTEROL SULFATE HFA 108 (90 BASE) MCG/ACT IN AERS: 2.0000 | INHALATION_SPRAY | Freq: Four times a day (QID) | RESPIRATORY_TRACT | 0 refills | Status: DC | PRN

## 2021-10-07 NOTE — Plan of Care (Signed)

## 2021-10-07 NOTE — Plan of Care (Signed)

## 2021-10-07 NOTE — Progress Notes (Signed)
AVS and discharge instructions reviewed w/ patient. Patient verbalized understanding and had no further questions. Patient discharged and set up own ride to go home. Patient requested to wait for ride in the hospital's main waiting area.

## 2021-10-07 NOTE — Discharge Summary (Signed)
Physician Discharge Summary  Anthony Finley WNU:[id number] DOB: 12-13-1970 DOA: 10/05/2021  PCP: Patient, No Pcp Per (Inactive)  Admit date: 10/05/2021 Discharge date: 10/07/2021  Admitted From: home Disposition:  home  Recommendations for Outpatient Follow-up:  Follow up with PCP in 1-2 weeks  Home Health: none Equipment/Devices: none  Discharge Condition: stable CODE STATUS: Full code Diet Orders (From admission, onward)     Start     Ordered   10/05/21 0520  Diet regular Room service appropriate? Yes; Fluid consistency: Thin  Diet effective now       Question Answer Comment  Room service appropriate? Yes   Fluid consistency: Thin      10/05/21 0519            HPI: Per admitting MD, Anthony Finley is a 51 y.o. male with medical history significant of anxiety, asthma, COPD, hyperlipidemia, thrombocytopenia, tobacco abuse, class I obesity who is coming to the emergency department with progressively worse dyspnea associated with wheezing and cough that started about a month ago.  However, he has had significant symptoms over the last 2 days.  He has been having productive cough of whitish or yellowish sputum.He denied fever, chills, rhinorrhea, sore throat or hemoptysis.  Positive pleuritic chest pain, but no palpitations, diaphoresis, PND, orthopnea or pitting edema of the lower extremities.  No abdominal pain, nausea, emesis, diarrhea, constipation, melena or hematochezia.  No flank pain, dysuria, frequency or hematuria.  No polyuria, polydipsia, polyphagia or blurred vision.  He received magnesium sulfate, Solu-Medrol and a DuoNeb from EMS in route to the hospital.  Hospital Course / Discharge diagnoses: Principal Problem:   Acute exacerbation of chronic obstructive pulmonary disease (COPD) ([place]) Active Problems:   Tobacco abuse   Thrombocytopenia (HCC)   Hyperglycemia   Elevated serum creatinine   Class 1 obesity   Leukocytosis   Principal problem Acute  hypoxic respiratory failure in the setting of COPD exacerbation-patient was admitted to the hospital, placed on supplemental oxygen, steroids as well as nebulizers.  He improved, his wheezing has resolved, he was weaned off to room air and feels back to baseline.  He is ambulating in the hall without difficulties, will be discharged home in stable condition.  You will be placed on a prednisone taper and complete 3 additional days of antibiotics as empirically started while hospitalized.  Active problems Tobacco use-counseled for cessation, nicotine patch prescribed on discharge Thrombocytopenia-improving, unclear significance. Leukocytosis-due to steroids Cocaine use-counseled for cessation, suspect #1 related to that as well Obesity-BMI 33, he would benefit from weight loss Hyperlipidemia-continue statin  Sepsis ruled out   Discharge Instructions   Allergies as of 10/07/2021       Reactions   Cephalosporins Anaphylaxis   Hives/ Swelling   Fish-derived Products Anaphylaxis   Keflex [cephalexin] Anaphylaxis   Penicillins Anaphylaxis, Hives   Has patient had a PCN reaction causing immediate rash, facial/tongue/throat swelling, SOB or lightheadedness with hypotension: No Has patient had a PCN reaction causing severe rash involving mucus membranes or skin necrosis: No Has patient had a PCN reaction that required hospitalization: Yes Has patient had a PCN reaction occurring within the last 10 years: Yes If all of the above answers are "NO", then may proceed with Cephalosporin use.        Medication List     STOP taking these medications    predniSONE 10 MG (21) Tbpk tablet Commonly known as: STERAPRED UNI-PAK 21 TAB Replaced by: predniSONE 10 MG tablet  TAKE these medications    albuterol 108 (90 Base) MCG/ACT inhaler Commonly known as: VENTOLIN HFA Inhale 2 puffs into the lungs every 6 (six) hours as needed for wheezing or shortness of breath.   atorvastatin 40 MG  tablet Commonly known as: LIPITOR Take 1 tablet (40 mg total) by mouth daily.   azithromycin 250 MG tablet Commonly known as: ZITHROMAX Take 2 tablets (500 mg total) by mouth daily for 3 days.   BENADRYL PO Take by mouth.   fluticasone 44 MCG/ACT inhaler Commonly known as: Flovent HFA Inhale 2 puffs into the lungs in the morning and at bedtime.   nicotine 21 mg/24hr patch Commonly known as: NICODERM CQ - dosed in mg/24 hours Place 1 patch (21 mg total) onto the skin daily.   predniSONE 10 MG tablet Commonly known as: DELTASONE Take 4 tablets (40 mg total) by mouth daily for 3 days, THEN 3 tablets (30 mg total) daily for 3 days, THEN 2 tablets (20 mg total) daily for 3 days, THEN 1 tablet (10 mg total) daily for 3 days. Start taking on: October 07, 2021 Replaces: predniSONE 10 MG (21) Tbpk tablet   tamsulosin 0.4 MG Caps capsule Commonly known as: FLOMAX Take 1 capsule (0.4 mg total) by mouth daily.       Consultations: none  Procedures/Studies:  DG CHEST PORT 1 VIEW  Result Date: 10/07/2021 CLINICAL DATA:  Shortness of breath EXAM: PORTABLE CHEST 1 VIEW COMPARISON:  October 05, 2021 FINDINGS: The heart size and mediastinal contours are within normal limits. Mild increased pulmonary interstitium is identified in both lungs. There is no focal pneumonia or pleural effusion. Bullous changes of bilateral apices are identified. The visualized skeletal structures are unremarkable. IMPRESSION: Mild increased pulmonary interstitium in both lungs. This may be due to interstitial edema. Electronically Signed   By: Abelardo Diesel M.D.   On: 10/07/2021 07:35   DG Chest Port 1 View  Result Date: 10/05/2021 CLINICAL DATA:  Respiratory distress and wheezing EXAM: PORTABLE CHEST 1 VIEW COMPARISON:  08/23/2018, 08/05/2018 FINDINGS: Large left greater than right apical bulla. Hazy bibasilar opacities. Normal cardiac size. No pleural effusion. No definitive pneumothorax. IMPRESSION: 1. Large left greater  than right apical bulla 2. Hazy atelectasis or mild infiltrate at the bases Electronically Signed   By: Donavan Foil M.D.   On: 10/05/2021 01:22     Subjective: - no chest pain, shortness of breath, no abdominal pain, nausea or vomiting.   Discharge Exam: BP 128/83 (BP Location: Left Arm)   Pulse 77   Temp 98.1 F (36.7 C) (Oral)   Resp 20   Ht 6' (1.829 m)   Wt 111.6 kg   SpO2 93%   BMI 33.37 kg/m   General: Pt is alert, awake, not in acute distress Cardiovascular: RRR, S1/S2 +, no rubs, no gallops Respiratory: CTA bilaterally, no wheezing, no rhonchi Abdominal: Soft, NT, ND, bowel sounds + Extremities: no edema, no cyanosis   The results of significant diagnostics from this hospitalization (including imaging, microbiology, ancillary and laboratory) are listed below for reference.     Microbiology: Recent Results (from the past 240 hour(s))  SARS Coronavirus 2 by RT PCR (hospital order, performed in [facility] hospital lab) *cepheid single result test* Anterior Nasal Swab     Status: None   Collection Time: 10/05/21  6:21 AM   Specimen: Anterior Nasal Swab  Result Value Ref Range Status   SARS Coronavirus 2 by RT PCR NEGATIVE NEGATIVE Final  Comment: (NOTE) SARS-CoV-2 target nucleic acids are NOT DETECTED.  The SARS-CoV-2 RNA is generally detectable in upper and lower respiratory specimens during the acute phase of infection. The lowest concentration of SARS-CoV-2 viral copies this assay can detect is 250 copies / mL. A negative result does not preclude SARS-CoV-2 infection and should not be used as the sole basis for treatment or other patient management decisions.  A negative result may occur with improper specimen collection / handling, submission of specimen other than nasopharyngeal swab, presence of viral mutation(s) within the areas targeted by this assay, and inadequate number of viral copies (<250 copies / mL). A negative result must be combined with  clinical observations, patient history, and epidemiological information.  Fact Sheet for Patients:   https://www.patel.info/  Fact Sheet for Healthcare Providers: https://hall.com/  This test is not yet approved or  cleared by the [place] FDA and has been authorized for detection and/or diagnosis of SARS-CoV-2 by FDA under an Emergency Use Authorization (EUA).  This EUA will remain in effect (meaning this test can be used) for the duration of the COVID-19 declaration under Section 564(b)(1) of the Act, 21 U.S.C. section 360bbb-3(b)(1), unless the authorization is terminated or revoked sooner.  Performed at [facility], [place] [address] [place], [place] [address]   Respiratory (~20 pathogens) panel by PCR     Status: None   Collection Time: 10/06/21 10:19 AM   Specimen: Nasopharyngeal Swab; Respiratory  Result Value Ref Range Status   Adenovirus NOT DETECTED NOT DETECTED Final   Coronavirus 229E NOT DETECTED NOT DETECTED Final    Comment: (NOTE) The Coronavirus on the Respiratory Panel, DOES NOT test for the novel  Coronavirus (2019 nCoV)    Coronavirus HKU1 NOT DETECTED NOT DETECTED Final   Coronavirus NL63 NOT DETECTED NOT DETECTED Final   Coronavirus OC43 NOT DETECTED NOT DETECTED Final   Metapneumovirus NOT DETECTED NOT DETECTED Final   Rhinovirus / Enterovirus NOT DETECTED NOT DETECTED Final   Influenza A NOT DETECTED NOT DETECTED Final   Influenza B NOT DETECTED NOT DETECTED Final   Parainfluenza Virus 1 NOT DETECTED NOT DETECTED Final   Parainfluenza Virus 2 NOT DETECTED NOT DETECTED Final   Parainfluenza Virus 3 NOT DETECTED NOT DETECTED Final   Parainfluenza Virus 4 NOT DETECTED NOT DETECTED Final   Respiratory Syncytial Virus NOT DETECTED NOT DETECTED Final   Bordetella pertussis NOT DETECTED NOT DETECTED Final   Bordetella Parapertussis NOT DETECTED NOT DETECTED Final   Chlamydophila pneumoniae  NOT DETECTED NOT DETECTED Final   Mycoplasma pneumoniae NOT DETECTED NOT DETECTED Final    Comment: Performed at [facility], [place]. [address] [place], [place] [address]     Labs: Basic Metabolic Panel: Recent Labs  Lab 10/05/21 0058 10/06/21 0413 10/07/21 0340  NA 137 139 137  K 3.8 3.6 4.6  CL 104 105 104  CO2 '25 27 27  '$ GLUCOSE 127* 149* 223*  BUN '13 14 18  '$ CREATININE 1.32* 1.23 1.07  CALCIUM 9.5 9.1 9.6  MG  --   --  2.3  PHOS  --   --  3.0   Liver Function Tests: Recent Labs  Lab 10/07/21 0340  AST 18  ALT 31  ALKPHOS 46  BILITOT 0.4  PROT 6.8  ALBUMIN 3.5   CBC: Recent Labs  Lab 10/05/21 0058 10/06/21 0413 10/07/21 0340  WBC 14.0* 20.3* 21.9*  NEUTROABS  --   --  20.0*  HGB 16.0 14.5 15.2  HCT 45.3 41.8 45.4  MCV 89.3 90.5 91.2  PLT 97* 116* 138*   CBG: No results for input(s): GLUCAP in the last 168 hours. Hgb A1c No results for input(s): HGBA1C in the last 72 hours. Lipid Profile No results for input(s): CHOL, HDL, LDLCALC, TRIG, CHOLHDL, LDLDIRECT in the last 72 hours. Thyroid function studies No results for input(s): TSH, T4TOTAL, T3FREE, THYROIDAB in the last 72 hours.  Invalid input(s): FREET3 Urinalysis No results found for: COLORURINE, APPEARANCEUR, LABSPEC, PHURINE, GLUCOSEU, HGBUR, BILIRUBINUR, KETONESUR, PROTEINUR, UROBILINOGEN, NITRITE, LEUKOCYTESUR  FURTHER DISCHARGE INSTRUCTIONS:   Get Medicines reviewed and adjusted: Please take all your medications with you for your next visit with your Primary MD   Laboratory/radiological data: Please request your Primary MD to go over all hospital tests and procedure/radiological results at the follow up, please ask your Primary MD to get all Hospital records sent to his/her office.   In some cases, they will be blood work, cultures and biopsy results pending at the time of your discharge. Please request that your primary care M.D. goes through all the records of your hospital data and  follows up on these results.   Also Note the following: If you experience worsening of your admission symptoms, develop shortness of breath, life threatening emergency, suicidal or homicidal thoughts you must seek medical attention immediately by calling 911 or calling your MD immediately  if symptoms less severe.   You must read complete instructions/literature along with all the possible adverse reactions/side effects for all the Medicines you take and that have been prescribed to you. Take any new Medicines after you have completely understood and accpet all the possible adverse reactions/side effects.    Do not drive when taking Pain medications or sleeping medications (Benzodaizepines)   Do not take more than prescribed Pain, Sleep and Anxiety Medications. It is not advisable to combine anxiety,sleep and pain medications without talking with your primary care practitioner   Special Instructions: If you have smoked or chewed Tobacco  in the last 2 yrs please stop smoking, stop any regular Alcohol  and or any Recreational drug use.   Wear Seat belts while driving.   Please note: You were cared for by a hospitalist during your hospital stay. Once you are discharged, your primary care physician will handle any further medical issues. Please note that NO REFILLS for any discharge medications will be authorized once you are discharged, as it is imperative that you return to your primary care physician (or establish a relationship with a primary care physician if you do not have one) for your post hospital discharge needs so that they can reassess your need for medications and monitor your lab values.  Time coordinating discharge: 35 minutes  SIGNED:  Marzetta Board, MD, PhD 10/07/2021, 9:35 AM

## 2021-10-07 NOTE — TOC Transition Note (Signed)
Transition of Care [facility]) - CM/SW Discharge Note   Patient Details  Name: Anthony Finley MRN: [id number] Date of Birth: 07-05-1970  [facility]) CM/SW Contact:  Leeroy Cha, RN Phone Number: 10/07/2021, 10:00 AM   Clinical Narrative:    Patient dcd to home with self care. No toc needs present.     Barriers to Discharge: Continued Medical Work up   Patient Goals and CMS Choice Patient states their goals for this hospitalization and ongoing recovery are:: to go home CMS Medicare.gov Compare Post Acute Care list provided to:: Patient    Discharge Placement                       Discharge Plan and Services   Discharge Planning Services: CM Consult                                 Social Determinants of Health (SDOH) Interventions     Readmission Risk Interventions     View : No data to display.

## 2021-10-18 ENCOUNTER — Encounter (HOSPITAL_COMMUNITY): Payer: Self-pay

## 2021-10-18 ENCOUNTER — Other Ambulatory Visit: Payer: Self-pay

## 2021-10-18 ENCOUNTER — Emergency Department (HOSPITAL_COMMUNITY)
Admission: EM | Admit: 2021-10-18 | Discharge: 2021-10-18 | Disposition: A | Discharge status: Home / Self Care | Payer: 59 | Attending: Emergency Medicine | Admitting: Emergency Medicine

## 2021-10-18 DIAGNOSIS — F1410 Cocaine abuse, uncomplicated: Secondary | ICD-10-CM | POA: Insufficient documentation

## 2021-10-18 DIAGNOSIS — J441 Chronic obstructive pulmonary disease with (acute) exacerbation: Secondary | ICD-10-CM | POA: Diagnosis not present

## 2021-10-18 DIAGNOSIS — D72829 Elevated white blood cell count, unspecified: Secondary | ICD-10-CM | POA: Insufficient documentation

## 2021-10-18 DIAGNOSIS — F1910 Other psychoactive substance abuse, uncomplicated: Secondary | ICD-10-CM

## 2021-10-18 DIAGNOSIS — Z7952 Long term (current) use of systemic steroids: Secondary | ICD-10-CM | POA: Diagnosis not present

## 2021-10-18 DIAGNOSIS — F419 Anxiety disorder, unspecified: Secondary | ICD-10-CM | POA: Insufficient documentation

## 2021-10-18 LAB — COMPREHENSIVE METABOLIC PANEL
ALT: 30 U/L (ref 0–44)
AST: 22 U/L (ref 15–41)
Albumin: 3.6 g/dL (ref 3.5–5.0)
Alkaline Phosphatase: 48 U/L (ref 38–126)
Anion gap: 11 (ref 5–15)
BUN: 9 mg/dL (ref 6–20)
CO2: 20 mmol/L — ABNORMAL LOW (ref 22–32)
Calcium: 8.9 mg/dL (ref 8.9–10.3)
Chloride: 105 mmol/L (ref 98–111)
Creatinine, Ser: 1.16 mg/dL (ref 0.61–1.24)
GFR, Estimated: >60 mL/min (ref >60)
Glucose, Bld: 108 mg/dL — ABNORMAL HIGH (ref 70–99)
Potassium: 3.7 mmol/L (ref 3.5–5.1)
Sodium: 136 mmol/L (ref 135–145)
Total Bilirubin: 0.7 mg/dL (ref 0.3–1.2)
Total Protein: 6.3 g/dL — ABNORMAL LOW (ref 6.5–8.1)

## 2021-10-18 LAB — CBC
HCT: 44.1 % (ref 39.0–52.0)
Hemoglobin: 15.4 g/dL (ref 13.0–17.0)
MCH: 31.4 pg (ref 26.0–34.0)
MCHC: 34.9 g/dL (ref 30.0–36.0)
MCV: 89.8 fL (ref 80.0–100.0)
Platelets: 90 10*3/uL — ABNORMAL LOW (ref 150–400)
RBC: 4.91 MIL/uL (ref 4.22–5.81)
RDW: 12.6 % (ref 11.5–15.5)
WBC: 17.9 10*3/uL — ABNORMAL HIGH (ref 4.0–10.5)
nRBC: 0 % (ref 0.0–0.2)

## 2021-10-18 LAB — ETHANOL: Alcohol, Ethyl (B): 38 mg/dL — ABNORMAL HIGH (ref <10)

## 2021-10-18 LAB — SALICYLATE LEVEL: Salicylate Lvl: <7 mg/dL — ABNORMAL LOW (ref 7.0–30.0)

## 2021-10-18 LAB — RAPID URINE DRUG SCREEN, HOSP PERFORMED
Amphetamines: NOT DETECTED
Barbiturates: NOT DETECTED
Benzodiazepines: NOT DETECTED
Cocaine: POSITIVE — AB
Opiates: NOT DETECTED
Tetrahydrocannabinol: NOT DETECTED

## 2021-10-18 LAB — ACETAMINOPHEN LEVEL: Acetaminophen (Tylenol), Serum: <10 ug/mL — ABNORMAL LOW (ref 10–30)

## 2021-10-18 MED ORDER — LORAZEPAM 1 MG PO TABS
1.0000 mg | ORAL_TABLET | Freq: Once | ORAL | Status: AC
  Administered 2021-10-18: 1 mg via ORAL
  Filled 2021-10-18: qty 1

## 2021-10-18 MED ORDER — CHLORDIAZEPOXIDE HCL 25 MG PO CAPS: ORAL_CAPSULE | ORAL | 0 refills | Status: DC

## 2021-10-18 NOTE — Discharge Instructions (Addendum)
You have been seen and medically cleared at the [facility], ER.  You have been sent medications to help with any alcohol withdrawal symptoms.  Please take as directed.  Use this resource guide to call outpatient/residential substance abuse facilities for evaluation/admission.

## 2021-10-18 NOTE — ED Triage Notes (Signed)
Patient reports that he is trying to detox from ETOH and cocaine and last use last night. Complains of fatigue, denies pain, no tremors. Smoker. Alert and oriented

## 2021-10-18 NOTE — ED Provider Notes (Signed)
[facility] Provider Note   CSN: [id number] Arrival date & time: 10/18/21  1311     History  No chief complaint on file.   Anthony Finley is a 51 y.o. male.  HPI  51 year old male presents emergency department with request for detox from cocaine and alcohol.  Patient states his last use was around 2 AM this morning of cocaine around 10 AM this morning of alcohol.  He estimates he had about 3 beers and 2 shots of liquor.  Patient states he wants to detox from both of these substances.  He is never done a full detox before.  He called a couple outpatient facilities were unable to accept him and recommended that he comes to the ER.  He feels nauseous, restless, at times tremulous.  Denies any history of withdrawal seizures.  Denies any hallucinations but feels anxious.  No SI/HI.  Home Medications Prior to Admission medications   Medication Sig Start Date End Date Taking? Authorizing Provider  chlordiazePOXIDE (LIBRIUM) 25 MG capsule '50mg'$  PO TID x 1D, then 25-'50mg'$  PO BID X 1D, then 25-'50mg'$  PO QD X 1D 10/18/21  Yes Serenity Fortner M, DO  albuterol (VENTOLIN HFA) 108 (90 Base) MCG/ACT inhaler Inhale 2 puffs into the lungs every 6 (six) hours as needed for wheezing or shortness of breath. 10/07/21   Caren Griffins, MD  diphenhydrAMINE HCl (BENADRYL PO) Take by mouth.    [provider]  fluticasone (FLOVENT HFA) 44 MCG/ACT inhaler Inhale 2 puffs into the lungs in the morning and at bedtime. 10/07/21   Caren Griffins, MD  nicotine (NICODERM CQ - DOSED IN MG/24 HOURS) 21 mg/24hr patch Place 1 patch (21 mg total) onto the skin daily. 10/07/21 11/06/21  Caren Griffins, MD  predniSONE (DELTASONE) 10 MG tablet Take 4 tablets (40 mg total) by mouth daily for 3 days, THEN 3 tablets (30 mg total) daily for 3 days, THEN 2 tablets (20 mg total) daily for 3 days, THEN 1 tablet (10 mg total) daily for 3 days. 10/07/21 10/19/21  Caren Griffins, MD       Allergies    Cephalosporins, Fish-derived products, Keflex [cephalexin], and Penicillins    Review of Systems   Review of Systems  Constitutional:  Positive for fatigue.  Respiratory:  Negative for shortness of breath.   Cardiovascular:  Negative for chest pain.  Gastrointestinal:  Positive for nausea. Negative for diarrhea and vomiting.  Psychiatric/Behavioral:  Positive for agitation and sleep disturbance. Negative for hallucinations, self-injury and suicidal ideas. The patient is nervous/anxious.     Physical Exam Updated Vital Signs BP 124/87   Pulse 92   Temp 98.6 F (37 C) (Oral)   Resp 14   SpO2 97%  Physical Exam Vitals and nursing note reviewed.  Constitutional:      General: He is not in acute distress.    Appearance: Normal appearance.     Comments: Anxious, at times tremulous but cooperative and appropriate  HENT:     Head: Normocephalic.     Mouth/Throat:     Mouth: Mucous membranes are moist.  Eyes:     Pupils: Pupils are equal, round, and reactive to light.  Cardiovascular:     Rate and Rhythm: Normal rate.  Pulmonary:     Effort: Pulmonary effort is normal. No respiratory distress.  Abdominal:     Palpations: Abdomen is soft.     Tenderness: There is no abdominal tenderness.  Musculoskeletal:  General: No swelling or deformity.  Skin:    General: Skin is warm.  Neurological:     Mental Status: He is alert and oriented to person, place, and time. Mental status is at baseline.     ED Results / Procedures / Treatments   Labs (all labs ordered are listed, but only abnormal results are displayed) Labs Reviewed  COMPREHENSIVE METABOLIC PANEL - Abnormal; Notable for the following components:      Result Value   CO2 20 (*)    Glucose, Bld 108 (*)    Total Protein 6.3 (*)    All other components within normal limits  ETHANOL - Abnormal; Notable for the following components:   Alcohol, Ethyl (B) 38 (*)    All other components within normal  limits  SALICYLATE LEVEL - Abnormal; Notable for the following components:   Salicylate Lvl <4.4 (*)    All other components within normal limits  ACETAMINOPHEN LEVEL - Abnormal; Notable for the following components:   Acetaminophen (Tylenol), Serum <10 (*)    All other components within normal limits  CBC - Abnormal; Notable for the following components:   WBC 17.9 (*)    Platelets 90 (*)    All other components within normal limits  RAPID URINE DRUG SCREEN, HOSP PERFORMED - Abnormal; Notable for the following components:   Cocaine POSITIVE (*)    All other components within normal limits    EKG None  Radiology No results found.  Procedures Procedures    Medications Ordered in ED Medications  LORazepam (ATIVAN) tablet 1 mg (1 mg Oral Given 10/18/21 1936)    ED Course/ Medical Decision Making/ A&P                           Medical Decision Making Amount and/or Complexity of Data Reviewed Labs: ordered.  Risk Prescription drug management.   51 year old male presents emergency department requesting detox from alcohol and cocaine.  Last use was this morning.  He was tachycardic on arrival, at times tremulous.  Appears fatigued/nauseous but no other findings of acute alcohol withdrawal.  No history of alcohol withdrawal seizures.  Blood work shows a chronic leukocytosis, he is currently on a prednisone taper for COPD exacerbation.  No other symptoms of acute illness.  Blood work is otherwise reassuring. Tylenol and salicylate is negative, UDS + cocaine. Patient's been allowed to eat and drink, vitals have normalized, he is otherwise appropriate, cooperative, denying SI/HI.  No indication for admission for inpatient detox.  Patient has been provided outpatient resources in regards to detox/substance abuse support.  Will prescribe Librium as needed for alcohol withdrawal symptoms, educated the patient on this medication.  Patient at this time appears safe and stable for  discharge and close outpatient follow up. Discharge plan and strict return to ED precautions discussed, patient verbalizes understanding and agreement.        Final Clinical Impression(s) / ED Diagnoses Final diagnoses:  None    Rx / DC Orders ED Discharge Orders          Ordered    chlordiazePOXIDE (LIBRIUM) 25 MG capsule        10/18/21 1930              Araeya Lamb, Alvin Critchley, DO 10/18/21 2051

## 2021-10-18 NOTE — ED Provider Triage Note (Signed)
Emergency Medicine Provider Triage Evaluation Note  Anthony Finley , a 51 y.o. male  was evaluated in triage.  Pt complains of desire to detox from cocaine and alcohol.  Patient states he used cocaine around 2 AM this morning.  His last drink was around 10 AM this morning.  He had 3 beers and 2 shots of liquor.  He is currently complaining of being tired and more sleepy than usual.  Denies tremor, prior withdrawal seizure.  Denies nausea/vomiting, abdominal pain, fever, chills, chest pain, shortness of breath.  Review of Systems  Positive: See above Negative:   Physical Exam  BP 112/73 (BP Location: Right Arm)   Pulse (!) 103   Temp 98.6 F (37 C) (Oral)   Resp 16   SpO2 95%  Gen:   Awake, no distress   Resp:  Normal effort  MSK:   Moves extremities without difficulty  Other:  Patient nontremulous on exam.  Medical Decision Making  Medically screening exam initiated at 1:50 PM.  Appropriate orders placed.  Anthony Finley was informed that the remainder of the evaluation will be completed by another provider, this initial triage assessment does not replace that evaluation, and the importance of remaining in the ED until their evaluation is complete.     Wilnette Kales, [place] 10/18/21 1352

## 2022-01-05 ENCOUNTER — Other Ambulatory Visit (INDEPENDENT_AMBULATORY_CARE_PROVIDER_SITE_OTHER): Payer: Self-pay | Admitting: Primary Care

## 2022-01-05 DIAGNOSIS — J45901 Unspecified asthma with (acute) exacerbation: Secondary | ICD-10-CM

## 2022-01-05 NOTE — Telephone Encounter (Addendum)
Medication Refill - Medication: albuterol (VENTOLIN HFA) 108 (90 Base) MCG/ACT inhaler  Patient is out of this med Has the patient contacted their pharmacy? yes (Agent: If no, request that the patient contact the pharmacy for the refill. If patient does not wish to contact the pharmacy document the reason why and proceed with request.) (Agent: If yes, when and what did the pharmacy advise?)contact pcp  Preferred Pharmacy (with phone number or street name):  [place] - [address] Phone:  [phone]  Fax:  [phone]     Has the patient been seen for an appointment in the last year OR does the patient have an upcoming appointment? No   Agent: Please be advised that RX refills may take up to 3 business days. We ask that you follow-up with your pharmacy.

## 2022-01-06 MED ORDER — ALBUTEROL SULFATE HFA 108 (90 BASE) MCG/ACT IN AERS: 2.0000 | INHALATION_SPRAY | Freq: Four times a day (QID) | RESPIRATORY_TRACT | 2 refills | Status: DC | PRN

## 2022-01-06 NOTE — Telephone Encounter (Signed)
Requested Prescriptions  Pending Prescriptions Disp Refills  . albuterol (VENTOLIN HFA) 108 (90 Base) MCG/ACT inhaler 8 g 2    Sig: Inhale 2 puffs into the lungs every 6 (six) hours as needed for wheezing or shortness of breath.     Pulmonology:  Beta Agonists 2 Passed - 01/05/2022  2:02 PM      Passed - Last BP in normal range    BP Readings from Last 1 Encounters:  10/18/21 124/87         Passed - Last Heart Rate in normal range    Pulse Readings from Last 1 Encounters:  10/18/21 92         Passed - Valid encounter within last 12 months    Recent Outpatient Visits          9 months ago Left leg pain   [facility], Kriste Basque, NP   1 year ago Encounter to establish care   [place], NP      Future Appointments            Tomorrow Kerin Perna, NP [place]

## 2022-01-07 ENCOUNTER — Ambulatory Visit (INDEPENDENT_AMBULATORY_CARE_PROVIDER_SITE_OTHER): Payer: Self-pay | Admitting: Primary Care

## 2022-03-10 IMAGING — CR DG SHOULDER 2+V*L*
3 series · 3 of 3 positions shown · non-contrast
Comparison: None.

CLINICAL DATA: Status post reduction.

EXAM:
LEFT SHOULDER - 2+ VIEW

[shoulder grashey]
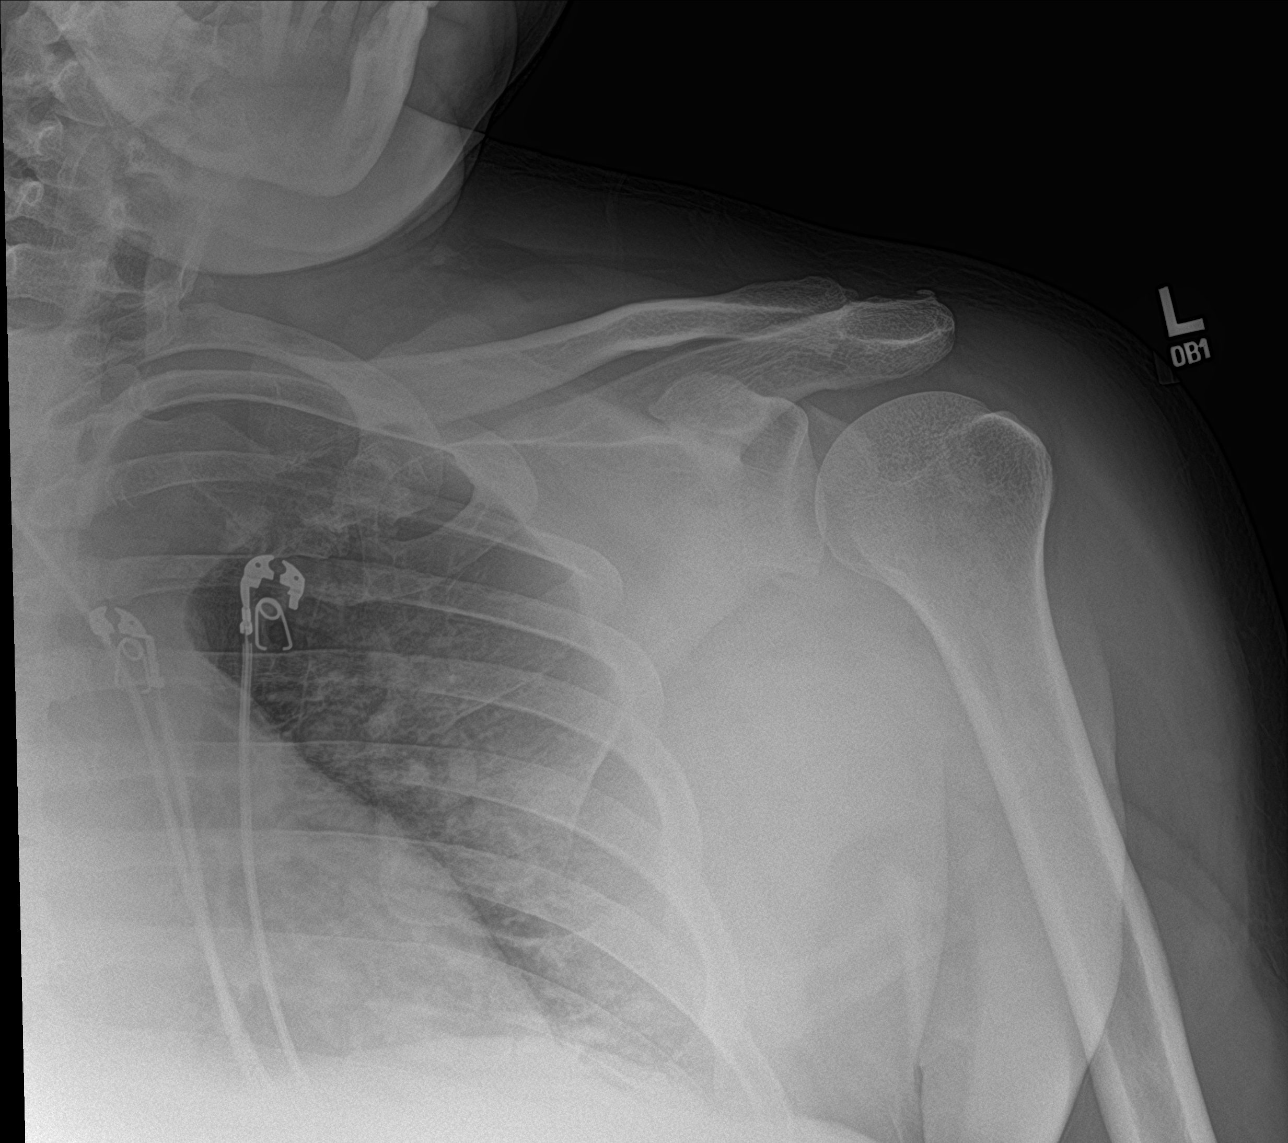

[shoulder y view]
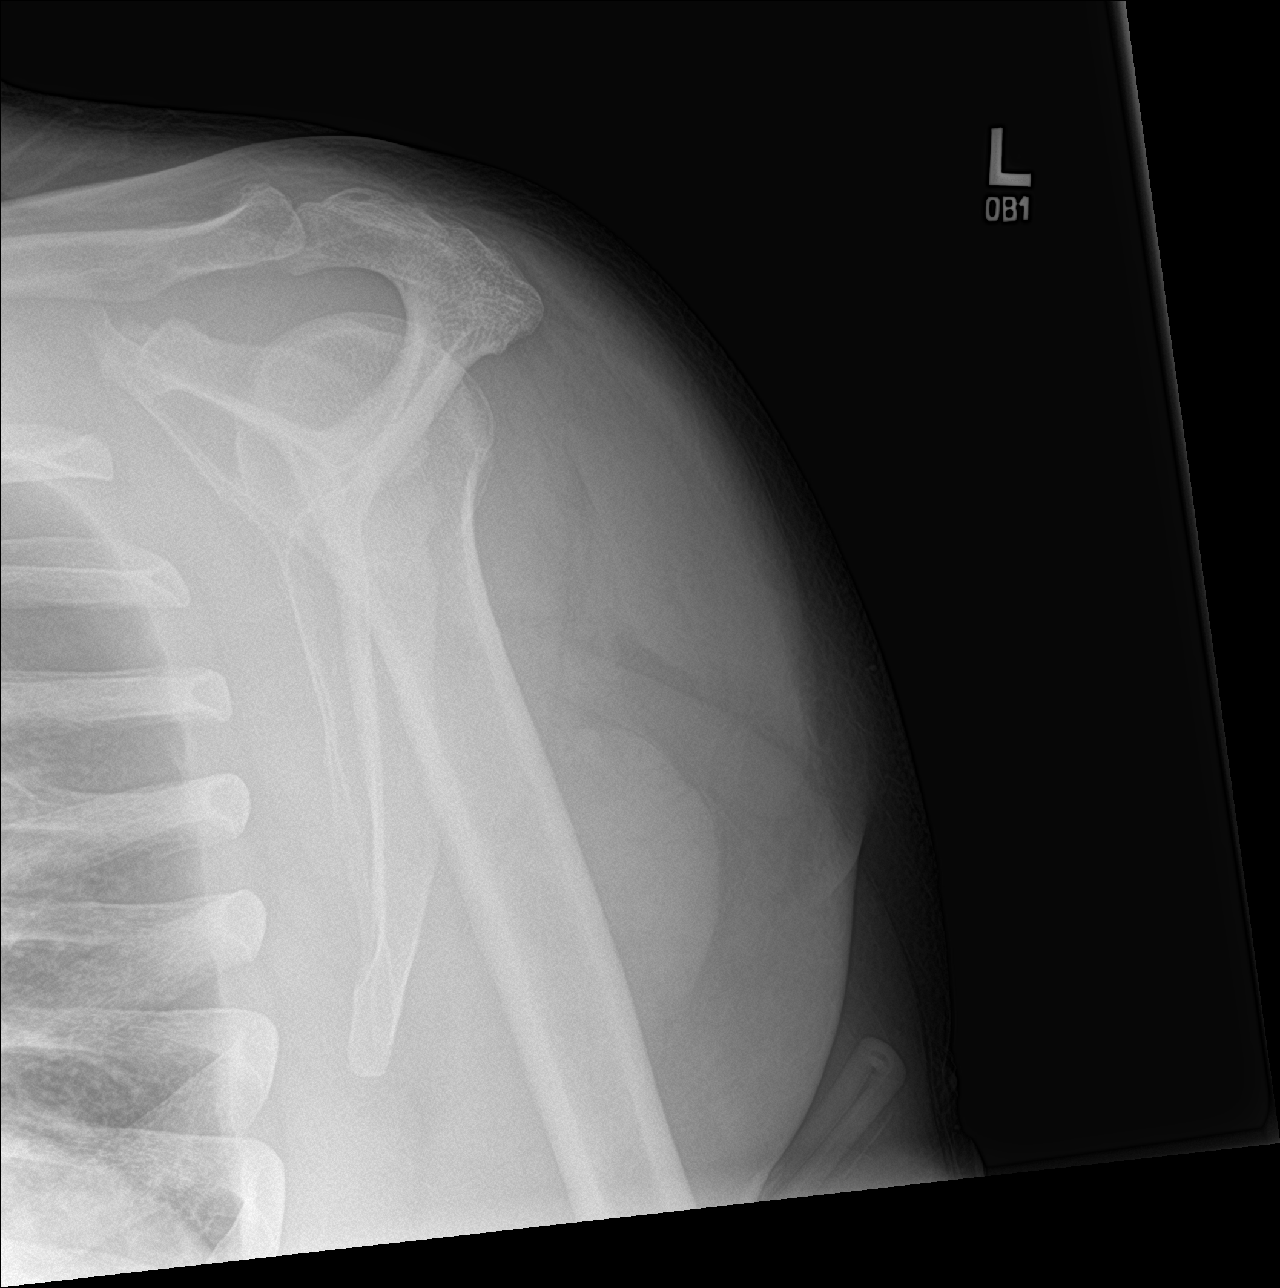

[shoulder ap neutral]
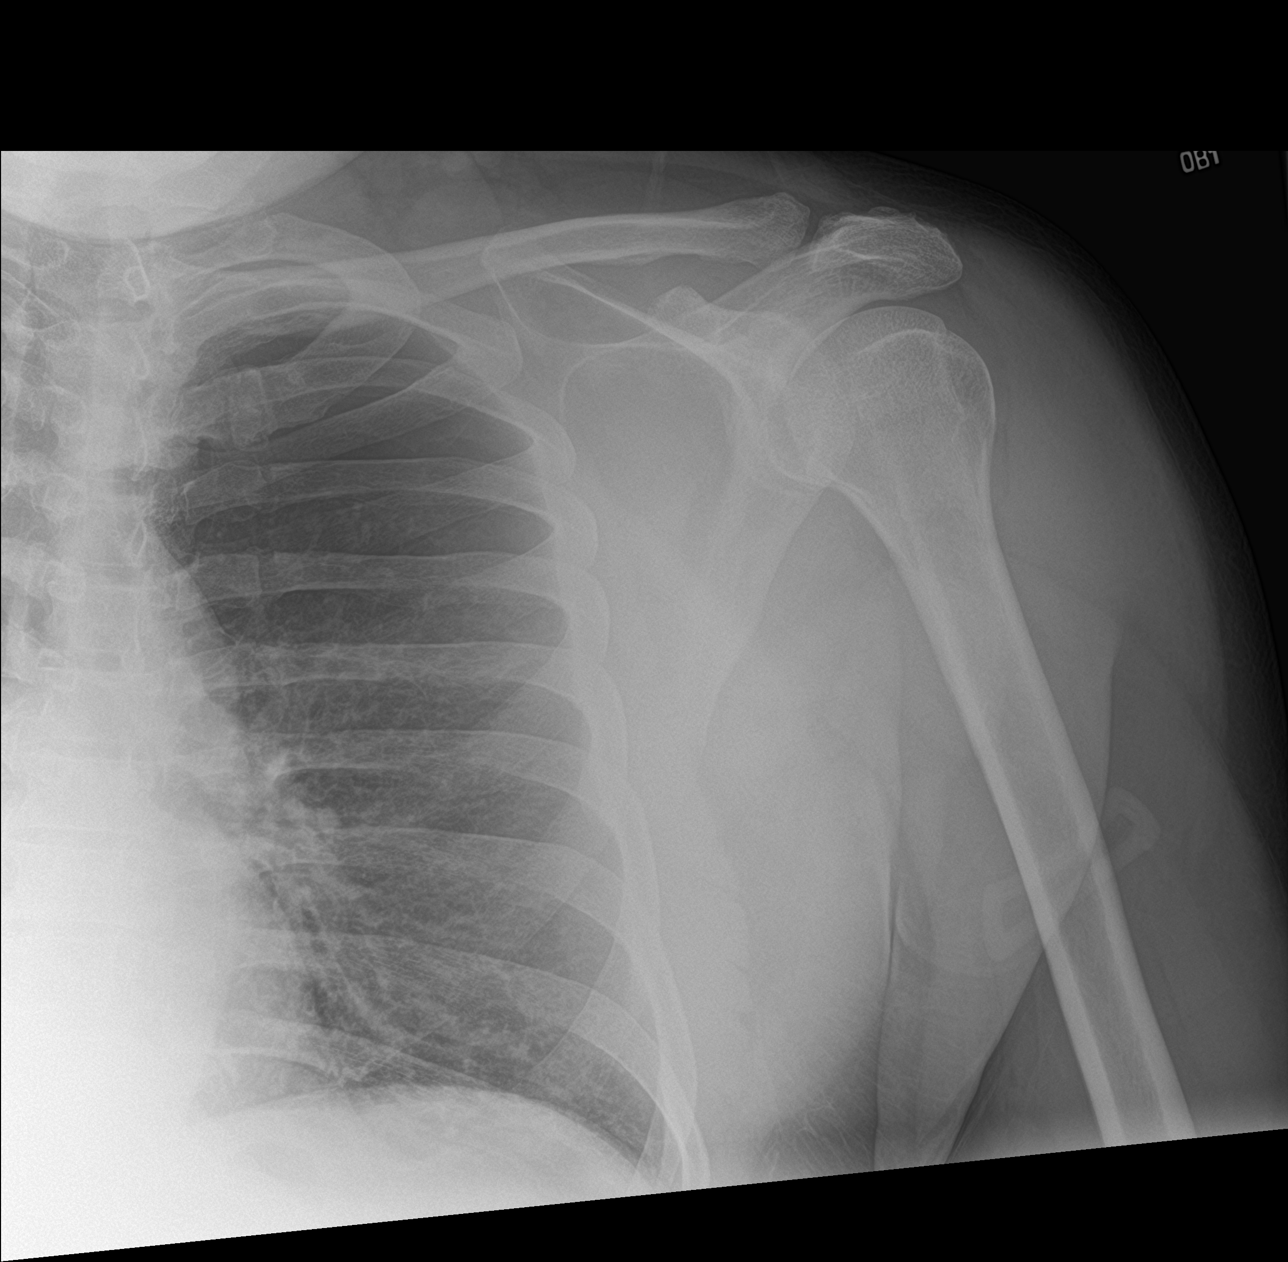

[3 of 3 positions shown; findings below may reference images not displayed]

FINDINGS: A 9 mm linear area of cortical density is seen just below the level
of the left glenoid. An additional 1.9 cm x 1.4 cm area of cortical
density is seen overlying the superior aspect of the glenohumeral
articulation on the frontal view. There is no evidence of
dislocation. Soft tissues are unremarkable.
IMPRESSION: Areas of cortical density inferior to the left glenoid and overlying
the superior aspect of the left glenohumeral articulation. CT
correlation is recommended to exclude the presence of an acute
fracture.

## 2022-04-13 ENCOUNTER — Encounter (INDEPENDENT_AMBULATORY_CARE_PROVIDER_SITE_OTHER): Payer: Self-pay | Admitting: Primary Care

## 2022-08-25 ENCOUNTER — Telehealth: Payer: Self-pay | Admitting: Primary Care

## 2022-08-25 NOTE — Telephone Encounter (Signed)
Copied from CRM [id number]. Topic: General - Other >> Aug 25, 2022  9:54 AM Geoffry Paradise G wrote: Reason for CRM: Please call pt regarding his insurance, patient has an appt April 26th. Do you accept [employer]?

## 2022-08-25 NOTE — Telephone Encounter (Signed)
Please reach out to pt?

## 2022-08-27 ENCOUNTER — Ambulatory Visit (INDEPENDENT_AMBULATORY_CARE_PROVIDER_SITE_OTHER): Payer: Medicaid Other | Admitting: Primary Care

## 2022-08-27 ENCOUNTER — Encounter (INDEPENDENT_AMBULATORY_CARE_PROVIDER_SITE_OTHER): Payer: Self-pay | Admitting: Primary Care

## 2022-08-27 VITALS — BP 115/86 | HR 83 | Resp 16 | Ht 71.5 in | Wt 256.8 lb

## 2022-08-27 DIAGNOSIS — R351 Nocturia: Secondary | ICD-10-CM | POA: Diagnosis not present

## 2022-08-27 DIAGNOSIS — J441 Chronic obstructive pulmonary disease with (acute) exacerbation: Secondary | ICD-10-CM | POA: Diagnosis not present

## 2022-08-27 DIAGNOSIS — Z6835 Body mass index (BMI) 35.0-35.9, adult: Secondary | ICD-10-CM

## 2022-08-27 DIAGNOSIS — J45901 Unspecified asthma with (acute) exacerbation: Secondary | ICD-10-CM

## 2022-08-27 DIAGNOSIS — Z23 Encounter for immunization: Secondary | ICD-10-CM | POA: Diagnosis not present

## 2022-08-27 DIAGNOSIS — E669 Obesity, unspecified: Secondary | ICD-10-CM | POA: Diagnosis not present

## 2022-08-27 NOTE — Progress Notes (Addendum)
[facility]  Anthony Finley, is a 52 y.o. male  VWU:[id number]  [id number]  DOB - 05/13/1970  Chief Complaint  Patient presents with   Asthma       Subjective:   [name] is a 52 y.o. obese male here today for a follow up visit. Patient has No headache, No chest pain, No abdominal pain - No Nausea, No new weakness tingling or numbness, No Cough - shortness of breath  No problems updated.  Allergies  Allergen Reactions   Cephalosporins Anaphylaxis    Hives/ Swelling    Fish-Derived Products Anaphylaxis   Keflex [Cephalexin] Anaphylaxis   Penicillins Anaphylaxis and Hives    Has patient had a PCN reaction causing immediate rash, facial/tongue/throat swelling, SOB or lightheadedness with hypotension: No Has patient had a PCN reaction causing severe rash involving mucus membranes or skin necrosis: No Has patient had a PCN reaction that required hospitalization: Yes Has patient had a PCN reaction occurring within the last 10 years: Yes If all of the above answers are "NO", then may proceed with Cephalosporin use.     Past Medical History:  Diagnosis Date   Anxiety    Asthma    mostly as child-uses inh about q 3-4 months   COPD (chronic obstructive pulmonary disease) (HCC)    Hyperlipidemia    Thrombocytopenia (HCC)    Tobacco abuse     Current Outpatient Medications on File Prior to Visit  Medication Sig Dispense Refill   fluticasone (FLOVENT HFA) 44 MCG/ACT inhaler Inhale 2 puffs into the lungs in the morning and at bedtime. 1 each 0   chlordiazePOXIDE (LIBRIUM) 25 MG capsule 50mg  PO TID x 1D, then 25-50mg  PO BID X 1D, then 25-50mg  PO QD X 1D (Patient not taking: Reported on 08/27/2022) 10 capsule 0   diphenhydrAMINE HCl (BENADRYL PO) Take by mouth. (Patient not taking: Reported on 08/27/2022)     No current facility-administered medications on file prior to visit.    Objective:   Vitals:   08/27/22 0914  BP: 115/86  Pulse: 83   Resp: 16  SpO2: 96%  Weight: 256 lb 12.8 oz (116.5 kg)  Height: 5' 11.5" (1.816 m)    Comprehensive ROS Pertinent positive and negative noted in HPI   Exam General appearance : Awake, alert, not in any distress. Speech Clear. Not toxic looking HEENT: Atraumatic and Normocephalic, pupils equally reactive to light and accomodation Neck: Supple, no JVD. No cervical lymphadenopathy.  Chest: Good air entry bilaterally, no added sounds  CVS: S1 S2 regular, no murmurs.  Abdomen: Bowel sounds present, Non tender and not distended with no gaurding, rigidity or rebound. Extremities: B/L Lower Ext shows no edema, both legs are warm to touch Neurology: Awake alert, and oriented X 3, CN II-XII intact, Non focal Skin: No Rash  Data Review No results found for: "HGBA1C"  Assessment & Plan  Clemons was seen today for asthma.  Diagnoses and all orders for this visit:  Need for shingles vaccine  Class 1 obesity Obesity is 30-39 indicating an excess in caloric intake or underlining conditions. This may lead to other co-morbidities. Educated on lifestyle modifications of diet and exercise which may reduce obesity.    Asthma with COPD with exacerbation (HCC) He has cough, wheeze, and shortness of breath brought on by characteristic triggers and relieved by bronchodilating medications  albuterol (VENTOLIN HFA) 108 (90 Base) MCG/ACT inhaler Will order nebulizer machine for management of asthma   Nocturia -  PSA  Other orders -     Varicella-zoster vaccine IM      Patient have been counseled extensively about nutrition and exercise. Other issues discussed during this visit include: low cholesterol diet, weight control and daily exercise, foot care, annual eye examinations at Ophthalmology, importance of adherence with medications and regular follow-up. We also discussed long term complications of uncontrolled diabetes and hypertension.   No follow-ups on file.  The patient was given  clear instructions to go to ER or return to medical center if symptoms don't improve, worsen or new problems develop. The patient verbalized understanding. The patient was told to call to get lab results if they haven't heard anything in the next week.   This note has been created with Education officer, environmental. Any transcriptional errors are unintentional.   [name], NP 09/05/2022, 11:54 PM

## 2022-08-28 ENCOUNTER — Other Ambulatory Visit (INDEPENDENT_AMBULATORY_CARE_PROVIDER_SITE_OTHER): Payer: Self-pay | Admitting: Primary Care

## 2022-08-28 DIAGNOSIS — R972 Elevated prostate specific antigen [PSA]: Secondary | ICD-10-CM

## 2022-08-28 LAB — PSA: Prostate Specific Ag, Serum: 9.2 ng/mL — ABNORMAL HIGH (ref 0.0–4.0)

## 2022-09-02 ENCOUNTER — Telehealth (INDEPENDENT_AMBULATORY_CARE_PROVIDER_SITE_OTHER): Payer: Self-pay

## 2022-09-02 NOTE — Telephone Encounter (Signed)
Contacted pt to go over lab results pt is aware and doesn't have any questions or concerns 

## 2022-09-03 ENCOUNTER — Other Ambulatory Visit (INDEPENDENT_AMBULATORY_CARE_PROVIDER_SITE_OTHER): Payer: Self-pay | Admitting: Primary Care

## 2022-09-03 DIAGNOSIS — J45901 Unspecified asthma with (acute) exacerbation: Secondary | ICD-10-CM

## 2022-09-03 MED ORDER — ALBUTEROL SULFATE HFA 108 (90 BASE) MCG/ACT IN AERS: 2.0000 | INHALATION_SPRAY | Freq: Four times a day (QID) | RESPIRATORY_TRACT | 2 refills | Status: DC | PRN

## 2022-09-03 NOTE — Telephone Encounter (Signed)
Copied from CRM [id number]. Topic: General - Inquiry >> Sep 03, 2022  8:52 AM De Blanch wrote: Reason for CRM:Pt asked if Medicaid covered a breathing machine. He stated he would benefit from one if it could be called in and covered by Medicaid.  Please advise.

## 2022-09-03 NOTE — Telephone Encounter (Signed)
[facility], [place] called and spoke to Anthony Finley, [facility] about the refill(s) albuterol requested. Advised [facility] and if she could pull it over. She says she's submitted the request for transfer.

## 2022-09-03 NOTE — Telephone Encounter (Signed)
Rx for Albuterol inhaler has been sent to pharmacy.  Will do rx for nebulizer machine and will have provider sign when she returns to office   Contacted pt and left a detailed vm making pt aware that once provider sign rx I will fax it a medical supply store and I will let him know which company and if he has any questions or concerns to give [place] a call

## 2022-09-03 NOTE — Telephone Encounter (Signed)
Medication Refill - Medication: albuterol (PROVENTIL HFA;VENTOLIN HFA) 108 (90 Base) MCG/ACT inhaler 1-2 puff , albuterol (VENTOLIN HFA) 108 (90 Base) MCG/ACT inhaler   Pt stated this was supposed to be sent in for him on 04/26 after his appointment, but nothing was sent.  Has the patient contacted their pharmacy? Yes.    (Agent: If yes, when and what did the pharmacy advise?)  Preferred Pharmacy (with phone number or street name):  [facility] [address], [place] - [address]  [address] [place] [address]  Phone: [phone] Fax: [phone]  Hours: Not open 24 hours   Has the patient been seen for an appointment in the last year OR does the patient have an upcoming appointment? Yes.    Agent: Please be advised that RX refills may take up to 3 business days. We ask that you follow-up with your pharmacy.

## 2022-09-03 NOTE — Telephone Encounter (Signed)
Medication Refill - Medication: albuterol (VENTOLIN HFA) 108 (90 Base) MCG/ACT inhaler [[id number]]   Medication sent to the wrong pharmacy  Has the patient contacted their pharmacy? No. (Agent: If no, request that the patient contact the pharmacy for the refill. If patient does not wish to contact the pharmacy document the reason why and proceed with request.) (Agent: If yes, when and what did the pharmacy advise?)  Preferred Pharmacy (with phone number or street name): [facility] [place], [place] - [address] Phone: [phone]  Fax: [phone]   Has the patient been seen for an appointment in the last year OR does the patient have an upcoming appointment? Yes.    Agent: Please be advised that RX refills may take up to 3 business days. We ask that you follow-up with your pharmacy.

## 2022-09-20 ENCOUNTER — Telehealth (INDEPENDENT_AMBULATORY_CARE_PROVIDER_SITE_OTHER): Payer: Self-pay | Admitting: Primary Care

## 2022-09-20 NOTE — Telephone Encounter (Signed)
Office note has been faxed.

## 2022-09-20 NOTE — Telephone Encounter (Signed)
Rosey Bath with [facility] states that they received an order for a nebulizer for the pt but they are needing the office notes for the need of the nebulizer for insurance purpose.   Fax number: [phone] Phone number: [phone]

## 2022-09-23 ENCOUNTER — Telehealth: Payer: Self-pay | Admitting: Primary Care

## 2022-09-23 NOTE — Telephone Encounter (Signed)
Copied from CRM [id number]. Topic: General - Other >> Sep 23, 2022 10:59 AM Macon Large wrote: Reason for CRM: Pt stated he has the the machine for his breathing treatment but he does not have the solution. Pt also stated that his dentist is waiting to hear back from [place] regarding an approval for the anesthesia for the extraction. Pt requests call back to discuss. Cb# [phone]

## 2022-09-24 ENCOUNTER — Other Ambulatory Visit (INDEPENDENT_AMBULATORY_CARE_PROVIDER_SITE_OTHER): Payer: Self-pay

## 2022-09-24 MED ORDER — ALBUTEROL SULFATE (2.5 MG/3ML) 0.083% IN NEBU: 2.5000 mg | INHALATION_SOLUTION | Freq: Four times a day (QID) | RESPIRATORY_TRACT | 1 refills | Status: DC | PRN

## 2022-09-24 NOTE — Telephone Encounter (Signed)
Contacted pt and made aware that solution for nebulizer machine has been sent and I have faxed form to [facility] for procedure pt doesn't have any questions or concerns

## 2022-12-01 ENCOUNTER — Ambulatory Visit (INDEPENDENT_AMBULATORY_CARE_PROVIDER_SITE_OTHER): Payer: Self-pay | Admitting: Primary Care

## 2022-12-01 ENCOUNTER — Encounter (INDEPENDENT_AMBULATORY_CARE_PROVIDER_SITE_OTHER): Payer: Self-pay

## 2022-12-02 ENCOUNTER — Other Ambulatory Visit (INDEPENDENT_AMBULATORY_CARE_PROVIDER_SITE_OTHER): Payer: Self-pay | Admitting: Primary Care

## 2022-12-02 DIAGNOSIS — J45901 Unspecified asthma with (acute) exacerbation: Secondary | ICD-10-CM

## 2022-12-02 NOTE — Telephone Encounter (Signed)
Medication Refill - Medication: albuterol (VENTOLIN HFA) 108 (90 Base) MCG/ACT inhaler   Has the patient contacted their pharmacy? Yes.   (Agent: If no, request that the patient contact the pharmacy for the refill. If patient does not wish to contact the pharmacy document the reason why and proceed with request.) (Agent: If yes, when and what did the pharmacy advise?)  Preferred Pharmacy (with phone number or street name):  [facility] - [place]),  - [address] Phone: [phone]  Fax: [phone]     Has the patient been seen for an appointment in the last year OR does the patient have an upcoming appointment? Yes.    Agent: Please be advised that RX refills may take up to 3 business days. We ask that you follow-up with your pharmacy.Marland Kitchen

## 2022-12-03 MED ORDER — ALBUTEROL SULFATE HFA 108 (90 BASE) MCG/ACT IN AERS
2.0000 | INHALATION_SPRAY | Freq: Four times a day (QID) | RESPIRATORY_TRACT | 0 refills | Status: DC | PRN
Start: 2022-12-03 — End: 2022-12-19

## 2022-12-03 NOTE — Telephone Encounter (Signed)
Requested Prescriptions  Pending Prescriptions Disp Refills   albuterol (VENTOLIN HFA) 108 (90 Base) MCG/ACT inhaler 8 g 0    Sig: Inhale 2 puffs into the lungs every 6 (six) hours as needed for wheezing or shortness of breath.     Pulmonology:  Beta Agonists 2 Passed - 12/02/2022 12:40 PM      Passed - Last BP in normal range    BP Readings from Last 1 Encounters:  08/27/22 115/86         Passed - Last Heart Rate in normal range    Pulse Readings from Last 1 Encounters:  08/27/22 83         Passed - Valid encounter within last 12 months    Recent Outpatient Visits           3 months ago Need for shingles vaccine   [place] [facility] [name], NP   1 year ago Left leg pain   [facility], Gildardo Pounds, NP   2 years ago Encounter to establish care   [place] [name], NP       Future Appointments             In 1 week Randa Evens, Kinnie Scales, NP  [facility]

## 2022-12-15 ENCOUNTER — Telehealth (INDEPENDENT_AMBULATORY_CARE_PROVIDER_SITE_OTHER): Payer: Self-pay | Admitting: Primary Care

## 2022-12-15 NOTE — Telephone Encounter (Signed)
Left VM with pt.

## 2022-12-16 ENCOUNTER — Ambulatory Visit (INDEPENDENT_AMBULATORY_CARE_PROVIDER_SITE_OTHER): Payer: Medicaid Other | Admitting: Primary Care

## 2022-12-16 ENCOUNTER — Encounter (INDEPENDENT_AMBULATORY_CARE_PROVIDER_SITE_OTHER): Payer: Self-pay | Admitting: Primary Care

## 2022-12-16 VITALS — BP 117/78 | HR 91 | Resp 16 | Wt 251.6 lb

## 2022-12-16 DIAGNOSIS — J441 Chronic obstructive pulmonary disease with (acute) exacerbation: Secondary | ICD-10-CM | POA: Diagnosis not present

## 2022-12-16 DIAGNOSIS — J45901 Unspecified asthma with (acute) exacerbation: Secondary | ICD-10-CM

## 2022-12-19 ENCOUNTER — Encounter (INDEPENDENT_AMBULATORY_CARE_PROVIDER_SITE_OTHER): Payer: Self-pay | Admitting: Primary Care

## 2022-12-19 DIAGNOSIS — F1910 Other psychoactive substance abuse, uncomplicated: Secondary | ICD-10-CM | POA: Insufficient documentation

## 2022-12-19 DIAGNOSIS — J9601 Acute respiratory failure with hypoxia: Secondary | ICD-10-CM | POA: Insufficient documentation

## 2022-12-19 MED ORDER — ALBUTEROL SULFATE HFA 108 (90 BASE) MCG/ACT IN AERS
2.0000 | INHALATION_SPRAY | Freq: Four times a day (QID) | RESPIRATORY_TRACT | 2 refills | Status: DC | PRN
Start: 2022-12-19 — End: 2023-04-19

## 2022-12-19 MED ORDER — FLUTICASONE PROPIONATE HFA 44 MCG/ACT IN AERO
2.0000 | INHALATION_SPRAY | Freq: Two times a day (BID) | RESPIRATORY_TRACT | 6 refills | Status: DC
Start: 2022-12-19 — End: 2023-07-14

## 2022-12-19 NOTE — Patient Instructions (Signed)
Calorie Counting for Weight Loss Calories are units of energy. Your body needs a certain number of calories from food to keep going throughout the day. When you eat or drink more calories than your body needs, your body stores the extra calories mostly as fat. When you eat or drink fewer calories than your body needs, your body burns fat to get the energy it needs. Calorie counting means keeping track of how many calories you eat and drink each day. Calorie counting can be helpful if you need to lose weight. If you eat fewer calories than your body needs, you should lose weight. Ask your health care provider what a healthy weight is for you. For calorie counting to work, you will need to eat the right number of calories each day to lose a healthy amount of weight per week. A dietitian can help you figure out how many calories you need in a day and will suggest ways to reach your calorie goal. A healthy amount of weight to lose each week is usually 1-2 lb (0.5-0.9 kg). This usually means that your daily calorie intake should be reduced by 500-750 calories. Eating 1,200-1,500 calories a day can help most women lose weight. Eating 1,500-1,800 calories a day can help most men lose weight. What do I need to know about calorie counting? Work with your health care provider or dietitian to determine how many calories you should get each day. To meet your daily calorie goal, you will need to: Find out how many calories are in each food that you would like to eat. Try to do this before you eat. Decide how much of the food you plan to eat. Keep a food log. Do this by writing down what you ate and how many calories it had. To successfully lose weight, it is important to balance calorie counting with a healthy lifestyle that includes regular activity. Where do I find calorie information?  The number of calories in a food can be found on a Nutrition Facts label. If a food does not have a Nutrition Facts label, try  to look up the calories online or ask your dietitian for help. Remember that calories are listed per serving. If you choose to have more than one serving of a food, you will have to multiply the calories per serving by the number of servings you plan to eat. For example, the label on a package of bread might say that a serving size is 1 slice and that there are 90 calories in a serving. If you eat 1 slice, you will have eaten 90 calories. If you eat 2 slices, you will have eaten 180 calories. How do I keep a food log? After each time that you eat, record the following in your food log as soon as possible: What you ate. Be sure to include toppings, sauces, and other extras on the food. How much you ate. This can be measured in cups, ounces, or number of items. How many calories were in each food and drink. The total number of calories in the food you ate. Keep your food log near you, such as in a pocket-sized notebook or on an app or website on your mobile phone. Some programs will calculate calories for you and show you how many calories you have left to meet your daily goal. What are some portion-control tips? Know how many calories are in a serving. This will help you know how many servings you can have of a certain   food. Use a measuring cup to measure serving sizes. You could also try weighing out portions on a kitchen scale. With time, you will be able to estimate serving sizes for some foods. Take time to put servings of different foods on your favorite plates or in your favorite bowls and cups so you know what a serving looks like. Try not to eat straight from a food's packaging, such as from a bag or box. Eating straight from the package makes it hard to see how much you are eating and can lead to overeating. Put the amount you would like to eat in a cup or on a plate to make sure you are eating the right portion. Use smaller plates, glasses, and bowls for smaller portions and to prevent  overeating. Try not to multitask. For example, avoid watching TV or using your computer while eating. If it is time to eat, sit down at a table and enjoy your food. This will help you recognize when you are full. It will also help you be more mindful of what and how much you are eating. What are tips for following this plan? Reading food labels Check the calorie count compared with the serving size. The serving size may be smaller than what you are used to eating. Check the source of the calories. Try to choose foods that are high in protein, fiber, and vitamins, and low in saturated fat, trans fat, and sodium. Shopping Read nutrition labels while you shop. This will help you make healthy decisions about which foods to buy. Pay attention to nutrition labels for low-fat or fat-free foods. These foods sometimes have the same number of calories or more calories than the full-fat versions. They also often have added sugar, starch, or salt to make up for flavor that was removed with the fat. Make a grocery list of lower-calorie foods and stick to it. Cooking Try to cook your favorite foods in a healthier way. For example, try baking instead of frying. Use low-fat dairy products. Meal planning Use more fruits and vegetables. One-half of your plate should be fruits and vegetables. Include lean proteins, such as chicken, turkey, and fish. Lifestyle Each week, aim to do one of the following: 150 minutes of moderate exercise, such as walking. 75 minutes of vigorous exercise, such as running. General information Know how many calories are in the foods you eat most often. This will help you calculate calorie counts faster. Find a way of tracking calories that works for you. Get creative. Try different apps or programs if writing down calories does not work for you. What foods should I eat?  Eat nutritious foods. It is better to have a nutritious, high-calorie food, such as an avocado, than a food with  few nutrients, such as a bag of potato chips. Use your calories on foods and drinks that will fill you up and will not leave you hungry soon after eating. Examples of foods that fill you up are nuts and nut butters, vegetables, lean proteins, and high-fiber foods such as whole grains. High-fiber foods are foods with more than 5 g of fiber per serving. Pay attention to calories in drinks. Low-calorie drinks include water and unsweetened drinks. The items listed above may not be a complete list of foods and beverages you can eat. Contact a dietitian for more information. What foods should I limit? Limit foods or drinks that are not good sources of vitamins, minerals, or protein or that are high in unhealthy fats. These   include: Candy. Other sweets. Sodas, specialty coffee drinks, alcohol, and juice. The items listed above may not be a complete list of foods and beverages you should avoid. Contact a dietitian for more information. How do I count calories when eating out? Pay attention to portions. Often, portions are much larger when eating out. Try these tips to keep portions smaller: Consider sharing a meal instead of getting your own. If you get your own meal, eat only half of it. Before you start eating, ask for a container and put half of your meal into it. When available, consider ordering smaller portions from the menu instead of full portions. Pay attention to your food and drink choices. Knowing the way food is cooked and what is included with the meal can help you eat fewer calories. If calories are listed on the menu, choose the lower-calorie options. Choose dishes that include vegetables, fruits, whole grains, low-fat dairy products, and lean proteins. Choose items that are boiled, broiled, grilled, or steamed. Avoid items that are buttered, battered, fried, or served with cream sauce. Items labeled as crispy are usually fried, unless stated otherwise. Choose water, low-fat milk,  unsweetened iced tea, or other drinks without added sugar. If you want an alcoholic beverage, choose a lower-calorie option, such as a glass of wine or light beer. Ask for dressings, sauces, and syrups on the side. These are usually high in calories, so you should limit the amount you eat. If you want a salad, choose a garden salad and ask for grilled meats. Avoid extra toppings such as bacon, cheese, or fried items. Ask for the dressing on the side, or ask for olive oil and vinegar or lemon to use as dressing. Estimate how many servings of a food you are given. Knowing serving sizes will help you be aware of how much food you are eating at restaurants. Where to find more information Centers for Disease Control and Prevention: www.cdc.gov U.[place]. Department of Agriculture: myplate.gov Summary Calorie counting means keeping track of how many calories you eat and drink each day. If you eat fewer calories than your body needs, you should lose weight. A healthy amount of weight to lose per week is usually 1-2 lb (0.5-0.9 kg). This usually means reducing your daily calorie intake by 500-750 calories. The number of calories in a food can be found on a Nutrition Facts label. If a food does not have a Nutrition Facts label, try to look up the calories online or ask your dietitian for help. Use smaller plates, glasses, and bowls for smaller portions and to prevent overeating. Use your calories on foods and drinks that will fill you up and not leave you hungry shortly after a meal. This information is not intended to replace advice given to you by your health care provider. Make sure you discuss any questions you have with your health care provider. Document Revised: 05/31/2019 Document Reviewed: 05/31/2019 Elsevier Patient Education  2023 [employer]  

## 2022-12-19 NOTE — Progress Notes (Signed)
[facility]  Anthony Finley, is a 52 y.o. male  ZOX:[id number]  WJX:[id number]  DOB - 17-Jun-1970  Chief Complaint  Patient presents with   Asthma       Subjective:   Anthony Finley is a 52 y.o. male here today for a follow up visit for asthma.  Requesting medication refills patient has No headache, No chest pain, No abdominal pain - No Nausea, No new weakness tingling or numbness, No Cough - shortness of breath with exertion.  Problem  Substance Abuse (Hcc)  Acute Respiratory Failure With Hypoxia (Hcc)    Allergies  Allergen Reactions   Cephalosporins Anaphylaxis    Hives/ Swelling    Fish-Derived Products Anaphylaxis   Keflex [Cephalexin] Anaphylaxis   Penicillins Anaphylaxis and Hives    Has patient had a PCN reaction causing immediate rash, facial/tongue/throat swelling, SOB or lightheadedness with hypotension: No Has patient had a PCN reaction causing severe rash involving mucus membranes or skin necrosis: No Has patient had a PCN reaction that required hospitalization: Yes Has patient had a PCN reaction occurring within the last 10 years: Yes If all of the above answers are "NO", then may proceed with Cephalosporin use.     Past Medical History:  Diagnosis Date   Anxiety    Asthma    mostly as child-uses inh about q 3-4 months   COPD (chronic obstructive pulmonary disease) (HCC)    Hyperlipidemia    Thrombocytopenia (HCC)    Tobacco abuse     Current Outpatient Medications on File Prior to Visit  Medication Sig Dispense Refill   albuterol (PROVENTIL) (2.5 MG/3ML) 0.083% nebulizer solution Take 3 mLs (2.5 mg total) by nebulization every 6 (six) hours as needed for wheezing or shortness of breath. 150 mL 1   No current facility-administered medications on file prior to visit.    Objective:   Vitals:   12/16/22 1122  BP: 117/78  Pulse: 91  Resp: 16  SpO2: 100%  Weight: 251 lb 9.6 oz (114.1 kg)    Comprehensive ROS Pertinent  positive and negative noted in HPI   Exam General appearance : Awake, alert, not in any distress. Speech Clear. Not toxic looking HEENT: Atraumatic and Normocephalic, pupils equally reactive to light and accomodation Neck: Supple, no JVD. No cervical lymphadenopathy.  Chest: Wheezing in all 4 quadrants CVS: S1 S2 regular, no murmurs.  Abdomen: Bowel sounds present, Non tender and not distended with no gaurding, rigidity or rebound. Extremities: B/L Lower Ext shows no edema, both legs are warm to touch Neurology: Awake alert, and oriented X 3, CN II-XII intact, Non focal Skin: No Rash  Data Review No results found for: "HGBA1C"  Assessment & Plan  Anthony Finley was seen today for asthma.  Diagnoses and all orders for this visit:  Asthma with COPD with exacerbation (HCC) -     albuterol (VENTOLIN HFA) 108 (90 Base) MCG/ACT inhaler; Inhale 2 puffs into the lungs every 6 (six) hours as needed for wheezing or shortness of breath. -     fluticasone (FLOVENT HFA) 44 MCG/ACT inhaler; Inhale 2 puffs into the lungs in the morning and at bedtime.  Obesity, morbid (HCC) Obesity is 30-39 indicating an excess in caloric intake or underlining conditions. This may lead to other co-morbidities. Educated on lifestyle modifications of diet and exercise which may reduce obesity.     Patient have been counseled extensively about nutrition and exercise. Other issues discussed during this visit include: low cholesterol diet, weight control and daily exercise,  foot care, annual eye examinations at Ophthalmology, importance of adherence with medications and regular follow-up. We also discussed long term complications of uncontrolled diabetes and hypertension.   Return in about 6 months (around 06/18/2023).  The patient was given clear instructions to go to ER or return to medical center if symptoms don't improve, worsen or new problems develop. The patient verbalized understanding. The patient was told to call to  get lab results if they haven't heard anything in the next week.   This note has been created with Education officer, environmental. Any transcriptional errors are unintentional.   [name], NP 12/19/2022, 9:45 AM

## 2023-02-16 ENCOUNTER — Telehealth: Payer: Self-pay

## 2023-02-16 NOTE — Transitions of Care (Post Inpatient/ED Visit) (Signed)
02/16/2023  Name: Anthony Finley MRN: [id number] DOB: 24-Dec-1970  Today's TOC FU Call Status:    Attempted to reach the patient regarding the most recent Inpatient/ED visit.  Follow Up Plan: No further outreach attempts will be made at this time. We have been unable to contact the patient.  Gabriel Cirri RN MSN  [place]  [facility],\  Email: [email or web address] Direct Dial: [phone]

## 2023-02-17 ENCOUNTER — Telehealth: Payer: Self-pay

## 2023-02-17 NOTE — Transitions of Care (Post Inpatient/ED Visit) (Signed)
02/17/2023  Name: Anthony Finley MRN: [id number] DOB: June 26, 1970  Today's TOC FU Call Status: Today's TOC FU Call Status:: Unsuccessful Call (2nd Attempt) Unsuccessful Call (2nd Attempt) Date: 02/16/23  Attempted to reach the patient regarding the most recent Inpatient/ED visit. Left VM with call back number. Outreach #2 post discharge from hospital.   Follow Up Plan: Additional outreach attempts will be made to reach the patient to complete the Transitions of Care (Post Inpatient/ED visit) call.   Gabriel Cirri RN MSN  [place]  [facility],  Email: [email or web address] Direct Dial: [phone]

## 2023-02-18 ENCOUNTER — Telehealth: Payer: Self-pay

## 2023-02-18 NOTE — Transitions of Care (Post Inpatient/ED Visit) (Signed)
02/18/2023  Name: ATO CRONEN MRN: [id number] DOB: 20-Nov-1970  Today's TOC FU Call Status: Today's TOC FU Call Status:: Unsuccessful Call (3rd Attempt) Unsuccessful Call (3rd Attempt) Date: 02/18/23  Attempted to reach the patient regarding the most recent Inpatient/ED visit.  Follow Up Plan: No further outreach attempts will be made at this time. We have been unable to contact the patient.  Alyse Low, RN, BA, [facility], CRRN [facility] Coordinator, Transition of Care Ph # [phone]

## 2023-03-10 ENCOUNTER — Encounter (HOSPITAL_COMMUNITY): Payer: Self-pay

## 2023-03-10 ENCOUNTER — Emergency Department (HOSPITAL_COMMUNITY)
Admission: EM | Admit: 2023-03-10 | Discharge: 2023-03-10 | Disposition: A | Discharge status: Home / Self Care | Payer: Medicaid Other

## 2023-03-10 ENCOUNTER — Emergency Department (HOSPITAL_COMMUNITY): Payer: Medicaid Other

## 2023-03-10 ENCOUNTER — Ambulatory Visit (HOSPITAL_COMMUNITY)
Admission: EM | Admit: 2023-03-10 | Discharge: 2023-03-10 | Disposition: A | Discharge status: Home / Self Care | Payer: Medicaid Other | Attending: Family Medicine | Admitting: Family Medicine

## 2023-03-10 ENCOUNTER — Encounter (HOSPITAL_COMMUNITY): Payer: Self-pay | Admitting: Emergency Medicine

## 2023-03-10 DIAGNOSIS — R110 Nausea: Secondary | ICD-10-CM | POA: Diagnosis not present

## 2023-03-10 DIAGNOSIS — R42 Dizziness and giddiness: Secondary | ICD-10-CM

## 2023-03-10 DIAGNOSIS — R519 Headache, unspecified: Secondary | ICD-10-CM | POA: Diagnosis present

## 2023-03-10 DIAGNOSIS — H532 Diplopia: Secondary | ICD-10-CM

## 2023-03-10 LAB — CBC WITH DIFFERENTIAL/PLATELET
Abs Immature Granulocytes: 0.03 10*3/uL (ref 0.00–0.07)
Basophils Absolute: 0 10*3/uL (ref 0.0–0.1)
Basophils Relative: 1 %
Eosinophils Absolute: 0.2 10*3/uL (ref 0.0–0.5)
Eosinophils Relative: 3 %
HCT: 43.3 % (ref 39.0–52.0)
Hemoglobin: 14.9 g/dL (ref 13.0–17.0)
Immature Granulocytes: 0 %
Lymphocytes Relative: 17 %
Lymphs Abs: 1.4 10*3/uL (ref 0.7–4.0)
MCH: 30.4 pg (ref 26.0–34.0)
MCHC: 34.4 g/dL (ref 30.0–36.0)
MCV: 88.4 fL (ref 80.0–100.0)
Monocytes Absolute: 0.8 10*3/uL (ref 0.1–1.0)
Monocytes Relative: 10 %
Neutro Abs: 6 10*3/uL (ref 1.7–7.7)
Neutrophils Relative %: 69 %
Platelets: 60 10*3/uL — ABNORMAL LOW (ref 150–400)
RBC: 4.9 MIL/uL (ref 4.22–5.81)
RDW: 12.8 % (ref 11.5–15.5)
WBC: 8.5 10*3/uL (ref 4.0–10.5)
nRBC: 0 % (ref 0.0–0.2)

## 2023-03-10 LAB — COMPREHENSIVE METABOLIC PANEL
ALT: 38 U/L (ref 0–44)
AST: 25 U/L (ref 15–41)
Albumin: 4 g/dL (ref 3.5–5.0)
Alkaline Phosphatase: 47 U/L (ref 38–126)
Anion gap: 12 (ref 5–15)
BUN: 9 mg/dL (ref 6–20)
CO2: 23 mmol/L (ref 22–32)
Calcium: 9.8 mg/dL (ref 8.9–10.3)
Chloride: 104 mmol/L (ref 98–111)
Creatinine, Ser: 0.84 mg/dL (ref 0.61–1.24)
GFR, Estimated: >60 mL/min (ref >60)
Glucose, Bld: 115 mg/dL — ABNORMAL HIGH (ref 70–99)
Potassium: 3.7 mmol/L (ref 3.5–5.1)
Sodium: 139 mmol/L (ref 135–145)
Total Bilirubin: 1.1 mg/dL (ref <1.2)
Total Protein: 7.3 g/dL (ref 6.5–8.1)

## 2023-03-10 MED ORDER — ACETAMINOPHEN 325 MG PO TABS
650.0000 mg | ORAL_TABLET | Freq: Once | ORAL | Status: AC
Start: 2023-03-10 — End: 2023-03-10
  Administered 2023-03-10: 650 mg via ORAL
  Filled 2023-03-10: qty 2

## 2023-03-10 MED ORDER — SODIUM CHLORIDE 0.9 % IV BOLUS
1000.0000 mL | Freq: Once | INTRAVENOUS | Status: AC
  Administered 2023-03-10: 1000 mL via INTRAVENOUS

## 2023-03-10 MED ORDER — KETOROLAC TROMETHAMINE 15 MG/ML IJ SOLN
15.0000 mg | Freq: Once | INTRAMUSCULAR | Status: AC
  Administered 2023-03-10: 15 mg via INTRAVENOUS
  Filled 2023-03-10: qty 1

## 2023-03-10 MED ORDER — PROCHLORPERAZINE EDISYLATE 10 MG/2ML IJ SOLN
10.0000 mg | Freq: Once | INTRAMUSCULAR | Status: AC
  Administered 2023-03-10: 10 mg via INTRAVENOUS
  Filled 2023-03-10: qty 2

## 2023-03-10 NOTE — ED Provider Triage Note (Signed)
Emergency Medicine Provider Triage Evaluation Note  Anthony Finley , a 52 y.o. male  was evaluated in triage.  Pt complains of headache for 4 days.  Pt reports double vision for 2 days with worsening headache  Review of Systems  Positive: Weak all over Negative: fever  Physical Exam  BP 130/87   Pulse (!) 101   Temp 98.3 F (36.8 C)   SpO2 96%  Gen:   Awake, no distress   Resp:  Normal effort  MSK:   Moves extremities without difficulty  Other:    Medical Decision Making  Medically screening exam initiated at 1:51 PM.  Appropriate orders placed.  Anthony Finley was informed that the remainder of the evaluation will be completed by another provider, this initial triage assessment does not replace that evaluation, and the importance of remaining in the ED until their evaluation is complete.     Elson Areas, [place] 03/10/23 1353

## 2023-03-10 NOTE — ED Notes (Signed)
Went over discharge papers. Pt. Verbalized understanding. Work note provided.

## 2023-03-10 NOTE — Discharge Instructions (Addendum)
He may take over-the-counter Tylenol alternate with Motrin.  Follow-up with your primary doctor.  Return immediately if your headache worsens, vision loss, facial droop, unilateral weakness, numbness tingling changes in sensation, chest pain, shortness of breath, passout or he develop any new or worsening symptoms that are concerning to you.

## 2023-03-10 NOTE — ED Triage Notes (Addendum)
Pt c/o headache, lightheaded, nauseous for 4 days. Pt states it is worse at night and is made worse by light. Does not have a hx of migraines.

## 2023-03-10 NOTE — ED Provider Notes (Signed)
[place] EMERGENCY DEPARTMENT AT [facility] Provider Note   CSN: [id number] Arrival date & time: 03/10/23  1321     History  Chief Complaint  Patient presents with   Headache    Anthony Finley is a 52 y.o. male.  52 year old male presenting emergency department with headache x 4 days.  Reports constant dull headache gradual onset.  Seemingly has changed positions back of the head, side of the head male side.  No facial droop, unilateral weakness, some blurred vision, but no vision loss.  No jaw claudication.  No neck stiffness no rash.   Headache      Home Medications Prior to Admission medications   Medication Sig Start Date End Date Taking? Authorizing Provider  albuterol (PROVENTIL) (2.5 MG/3ML) 0.083% nebulizer solution Take 3 mLs (2.5 mg total) by nebulization every 6 (six) hours as needed for wheezing or shortness of breath. 09/24/22   [name], NP  albuterol (VENTOLIN HFA) 108 (90 Base) MCG/ACT inhaler Inhale 2 puffs into the lungs every 6 (six) hours as needed for wheezing or shortness of breath. 12/19/22   [name], NP  fluticasone (FLOVENT HFA) 44 MCG/ACT inhaler Inhale 2 puffs into the lungs in the morning and at bedtime. 12/19/22   [name], NP      Allergies    Cephalosporins, Fish-derived products, Keflex [cephalexin], and Penicillins    Review of Systems   Review of Systems  Neurological:  Positive for headaches.    Physical Exam Updated Vital Signs BP 129/88   Pulse 75   Temp 97.8 F (36.6 C) (Oral)   Resp 19   SpO2 97%  Physical Exam Vitals and nursing note reviewed.  Constitutional:      General: He is not in acute distress.    Appearance: He is not toxic-appearing.  HENT:     Head: Normocephalic.  Eyes:     General: No visual field deficit.    Extraocular Movements: Extraocular movements intact.     Pupils: Pupils are equal, round, and reactive to light.  Cardiovascular:     Rate and  Rhythm: Normal rate.  Pulmonary:     Effort: Pulmonary effort is normal.  Abdominal:     Palpations: Abdomen is soft.  Musculoskeletal:     Cervical back: Normal range of motion and neck supple.  Skin:    General: Skin is warm.  Neurological:     Mental Status: He is alert.     GCS: GCS eye subscore is 4. GCS verbal subscore is 5. GCS motor subscore is 6.     Cranial Nerves: No cranial nerve deficit, dysarthria or facial asymmetry.     Sensory: No sensory deficit.     Motor: No weakness.     Coordination: Coordination normal.  Psychiatric:        Mood and Affect: Mood normal.        Behavior: Behavior normal.     ED Results / Procedures / Treatments   Labs (all labs ordered are listed, but only abnormal results are displayed) Labs Reviewed  CBC WITH DIFFERENTIAL/PLATELET - Abnormal; Notable for the following components:      Result Value   Platelets 60 (*)    All other components within normal limits  COMPREHENSIVE METABOLIC PANEL - Abnormal; Notable for the following components:   Glucose, Bld 115 (*)    All other components within normal limits    EKG None  Radiology CT Head Wo Contrast  Result Date: 03/10/2023 CLINICAL DATA:  Neuro deficit, acute, stroke suspected subacute 48 hrs. Headache, lightheaded and nausea. EXAM: CT HEAD WITHOUT CONTRAST TECHNIQUE: Contiguous axial images were obtained from the base of the skull through the vertex without intravenous contrast. RADIATION DOSE REDUCTION: This exam was performed according to the departmental dose-optimization program which includes automated exposure control, adjustment of the mA and/or kV according to patient size and/or use of iterative reconstruction technique. COMPARISON:  None Available. FINDINGS: Brain: No acute intracranial hemorrhage. Gray-white differentiation is preserved. No hydrocephalus or extra-axial collection. No mass effect or midline shift. Vascular: No hyperdense vessel or unexpected calcification.  Skull: No calvarial fracture or suspicious bone lesion. Skull base is unremarkable. Sinuses/Orbits: No acute finding. Other: None. IMPRESSION: No acute intracranial abnormality. Electronically Signed   By: Orvan Falconer M.D.   On: 03/10/2023 16:46    Procedures Procedures    Medications Ordered in ED Medications  ketorolac (TORADOL) 15 MG/ML injection 15 mg (15 mg Intravenous Given 03/10/23 1636)  acetaminophen (TYLENOL) tablet 650 mg (650 mg Oral Given 03/10/23 1635)  prochlorperazine (COMPAZINE) injection 10 mg (10 mg Intravenous Given 03/10/23 1635)  sodium chloride 0.9 % bolus 1,000 mL (1,000 mLs Intravenous New Bag/Given 03/10/23 1646)    ED Course/ Medical Decision Making/ A&P Clinical Course as of 03/10/23 1714  Thu Mar 10, 2023  1710 Patient reevaluated.  Reports headache improved.  CBC without leukocytosis to suggest systemic infection yet.  Comprehensive metabolic panel without metabolic derangement.  Normal kidney function.  No transaminitis to suggest hepatobiliary disease.  CT head without acute intracranial pathology.  EKG without ST segment changes to indicate ischemia.  Patient with normal neuroexam.  No signs of meningitis and had improvement of symptoms after medications.  Feel that he is stable for discharge at this time, follow-up PCP.  Return precautions given. [TY]    Clinical Course User Index [TY] Coral Spikes, DO                                 Medical Decision Making See ED course for MDM.  Amount and/or Complexity of Data Reviewed External Data Reviewed:     Details: Per chart review no documented history of headaches..  No prior advanced imaging of his head. Labs:  Decision-making details documented in ED Course. Radiology:  Decision-making details documented in ED Course. ECG/medicine tests:  Decision-making details documented in ED Course.  Risk OTC drugs. Prescription drug management.          Final Clinical Impression(s) / ED  Diagnoses Final diagnoses:  None    Rx / DC Orders ED Discharge Orders     None         Coral Spikes, DO 03/10/23 1714

## 2023-03-10 NOTE — ED Triage Notes (Signed)
Pt presents with c/o headache for 4 days. Pt reports the headache is causing him to be dizzy and nauseous. Pt sent over from UC.

## 2023-03-10 NOTE — ED Provider Notes (Signed)
[facility]    CSN: [id number] Arrival date & time: 03/10/23  0947      History   Chief Complaint Chief Complaint  Patient presents with   Headache    HPI Anthony Finley is a 52 y.o. male.    Headache Associated symptoms: dizziness    Patient is here for a headache, light headedness, dizzy, nauseated x 4 days.  Headache is nonstop, at the right side of the head.  8/10 in pain.  No h/o migraines.   No family h/o migraines.  Light sesative.  He has double vision as well.  He has taken tylenol/motrin with mild help.  Pain is worse when laying in bed at night.       Past Medical History:  Diagnosis Date   Anxiety    Asthma    mostly as child-uses inh about q 3-4 months   COPD (chronic obstructive pulmonary disease) (HCC)    Hyperlipidemia    Thrombocytopenia (HCC)    Tobacco abuse     Patient Active Problem List   Diagnosis Date Noted   Substance abuse (HCC) 12/19/2022   Acute respiratory failure with hypoxia (HCC) 12/19/2022   Acute exacerbation of chronic obstructive pulmonary disease (COPD) (HCC) 10/05/2021   Hyperglycemia 10/05/2021   Elevated serum creatinine 10/05/2021   Class 1 obesity 10/05/2021   Leukocytosis 10/05/2021   Left leg pain 03/17/2021   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   Asthma with COPD with exacerbation (HCC) 10/18/2017   Ischemic chest pain (HCC) 10/18/2017   Tobacco abuse 10/18/2017   Thrombocytopenia (HCC) 10/18/2017    Past Surgical History:  Procedure Laterality Date   FRACTURE SURGERY     orif fx lt ankle age 89   HAND SURGERY Left 08/2015   ORIF ANKLE FRACTURE  02/22/2012   Procedure: OPEN REDUCTION INTERNAL FIXATION (ORIF) ANKLE FRACTURE;  Surgeon: Toni Arthurs, MD;  Location: [facility];  Service: Orthopedics;  Laterality: Left;  Open Reduction Internal Fixation Left Ankle Bimalleolar Fracture;   Open Reduction Internal Fixation of Syndesmosis, and Repair of Deltoid Ligament    PERCUTANEOUS PINNING Left 08/11/2015   Procedure: PINNING OF LEFT 4TH METACARPAL FRACTURE;  Surgeon: Mack Hook, MD;  Location:  SURGERY CENTER;  Service: Orthopedics;  Laterality: Left;       Home Medications    Prior to Admission medications   Medication Sig Start Date End Date Taking? Authorizing Provider  albuterol (PROVENTIL) (2.5 MG/3ML) 0.083% nebulizer solution Take 3 mLs (2.5 mg total) by nebulization every 6 (six) hours as needed for wheezing or shortness of breath. 09/24/22   [name], NP  albuterol (VENTOLIN HFA) 108 (90 Base) MCG/ACT inhaler Inhale 2 puffs into the lungs every 6 (six) hours as needed for wheezing or shortness of breath. 12/19/22   [name], NP  fluticasone (FLOVENT HFA) 44 MCG/ACT inhaler Inhale 2 puffs into the lungs in the morning and at bedtime. 12/19/22   [name], NP    Family History Family History  Problem Relation Age of Onset   Colon polyps Brother    Colon cancer Neg Hx    Esophageal cancer Neg Hx    Rectal cancer Neg Hx    Stomach cancer Neg Hx     Social History Social History   Tobacco Use   Smoking status: Every Day    Current packs/day: 0.50    Types: Cigarettes   Smokeless tobacco: Never  Vaping Use   Vaping  status: Never Used  Substance Use Topics   Alcohol use: Yes    Comment: occ   Drug use: No     Allergies   Cephalosporins, Fish-derived products, Keflex [cephalexin], and Penicillins   Review of Systems Review of Systems  Constitutional: Negative.   HENT: Negative.    Eyes:  Positive for visual disturbance.  Respiratory: Negative.    Cardiovascular: Negative.   Gastrointestinal: Negative.   Musculoskeletal: Negative.   Neurological:  Positive for dizziness, light-headedness and headaches.  Psychiatric/Behavioral: Negative.       Physical Exam Triage Vital Signs ED Triage Vitals  Encounter Vitals Group     BP 03/10/23 1102 120/85     Systolic BP Percentile --       Diastolic BP Percentile --      Pulse Rate 03/10/23 1102 84     Resp 03/10/23 1102 17     Temp 03/10/23 1102 97.8 F (36.6 C)     Temp Source 03/10/23 1102 Oral     SpO2 03/10/23 1102 96 %     Weight --      Height --      Head Circumference --      Peak Flow --      Pain Score 03/10/23 1101 8     Pain Loc --      Pain Education --      Exclude from Growth Chart --    No data found.  Updated Vital Signs BP 120/85 (BP Location: Right Arm)   Pulse 84   Temp 97.8 F (36.6 C) (Oral)   Resp 17   SpO2 96%   Visual Acuity Right Eye Distance:   Left Eye Distance:   Bilateral Distance:    Right Eye Near:   Left Eye Near:    Bilateral Near:     Physical Exam Constitutional:      General: He is not in acute distress.    Appearance: He is well-developed. He is not ill-appearing.  Eyes:     Extraocular Movements: Extraocular movements intact.     Conjunctiva/sclera: Conjunctivae normal.  Pulmonary:     Effort: Pulmonary effort is normal.     Breath sounds: Normal breath sounds.  Neurological:     Mental Status: He is alert and oriented to person, place, and time.     Cranial Nerves: No cranial nerve deficit or facial asymmetry.  Psychiatric:        Mood and Affect: Mood normal.        Speech: Speech normal.        Behavior: Behavior normal.      UC Treatments / Results  Labs (all labs ordered are listed, but only abnormal results are displayed) Labs Reviewed - No data to display  EKG   Radiology No results found.  Procedures Procedures (including critical care time)  Medications Ordered in UC Medications - No data to display  Initial Impression / Assessment and Plan / UC Course  I have reviewed the triage vital signs and the nursing notes.  Pertinent labs & imaging results that were available during my care of the patient were reviewed by me and considered in my medical decision making (see chart for details).   Patient was seen today for new  onset headache x 4 days, with nausea, light sensitivity, dizziness and double vision.  No h/o migraines.  Given this is a new headache with associated symptoms, patient was advised to go to the ER for further evaluation, which  he agreed.   Final Clinical Impressions(s) / UC Diagnoses   Final diagnoses:  Bad headache  Double vision  Nausea without vomiting  Dizziness     Discharge Instructions      You were seen today for a bad headache.  As this is a new headache, with no history of migraines, you need to go to the ER for further evaluation.  Please head there now.     ED Prescriptions   None    PDMP not reviewed this encounter.   Jannifer Franklin, MD 03/10/23 1136

## 2023-03-10 NOTE — ED Notes (Signed)
Patient is being discharged from the Urgent Care and sent to the Emergency Department via POV . Per Jannifer Franklin, MD, patient is in need of higher level of care due to Headache. Patient is aware and verbalizes understanding of plan of care.

## 2023-03-10 NOTE — Discharge Instructions (Signed)
You were seen today for a bad headache.  As this is a new headache, with no history of migraines, you need to go to the ER for further evaluation.  Please head there now.

## 2023-04-11 ENCOUNTER — Ambulatory Visit (INDEPENDENT_AMBULATORY_CARE_PROVIDER_SITE_OTHER): Payer: Medicaid Other | Admitting: Primary Care

## 2023-04-11 ENCOUNTER — Encounter (INDEPENDENT_AMBULATORY_CARE_PROVIDER_SITE_OTHER): Payer: Self-pay | Admitting: Primary Care

## 2023-04-11 VITALS — BP 121/81 | HR 87 | Resp 16 | Wt 251.6 lb

## 2023-04-11 DIAGNOSIS — J441 Chronic obstructive pulmonary disease with (acute) exacerbation: Secondary | ICD-10-CM

## 2023-04-11 DIAGNOSIS — Z09 Encounter for follow-up examination after completed treatment for conditions other than malignant neoplasm: Secondary | ICD-10-CM | POA: Diagnosis not present

## 2023-04-11 NOTE — Progress Notes (Signed)
Subjective:  Anthony Finley is a 52 y.o. male presents for ED f/u.  Presented 03/10/23, for Bad headache, is nonstop, at the right side of the head. 8/10 in pain. Double vision, Nausea without vomiting and Dizziness.Patient relates symptoms to new medications received from urology. Also, taught correct way to use inhaler. Past Medical History:  Diagnosis Date   Anxiety    Asthma    mostly as child-uses inh about q 3-4 months   COPD (chronic obstructive pulmonary disease) (HCC)    Hyperlipidemia    Thrombocytopenia (HCC)    Tobacco abuse      Allergies  Allergen Reactions   Cephalosporins Anaphylaxis    Hives/ Swelling    Fish-Derived Products Anaphylaxis   Keflex [Cephalexin] Anaphylaxis   Penicillins Anaphylaxis and Hives    Has patient had a PCN reaction causing immediate rash, facial/tongue/throat swelling, SOB or lightheadedness with hypotension: No Has patient had a PCN reaction causing severe rash involving mucus membranes or skin necrosis: No Has patient had a PCN reaction that required hospitalization: Yes Has patient had a PCN reaction occurring within the last 10 years: Yes If all of the above answers are "NO", then may proceed with Cephalosporin use.     Current Outpatient Medications on File Prior to Visit  Medication Sig Dispense Refill   albuterol (PROVENTIL) (2.5 MG/3ML) 0.083% nebulizer solution Take 3 mLs (2.5 mg total) by nebulization every 6 (six) hours as needed for wheezing or shortness of breath. 150 mL 1   albuterol (VENTOLIN HFA) 108 (90 Base) MCG/ACT inhaler Inhale 2 puffs into the lungs every 6 (six) hours as needed for wheezing or shortness of breath. 18 g 2   fluticasone (FLOVENT HFA) 44 MCG/ACT inhaler Inhale 2 puffs into the lungs in the morning and at bedtime. 1 each 6   No current facility-administered medications on file prior to visit.    Review of System: ROS Comprehensive ROS Pertinent positive and negative noted in HPI    Objective:  BP 121/81   Pulse 87   Resp 16   Wt 251 lb 9.6 oz (114.1 kg)   SpO2 95%   BMI 34.60 kg/m   Physical Exam Vitals reviewed.  Constitutional:      Appearance: He is obese.  HENT:     Head: Normocephalic.     Right Ear: Tympanic membrane and external ear normal.     Left Ear: Tympanic membrane and external ear normal.     Nose: Nose normal.  Eyes:     Extraocular Movements: Extraocular movements intact.  Cardiovascular:     Rate and Rhythm: Normal rate and regular rhythm.  Pulmonary:     Breath sounds: Wheezing present.     Comments: Bilateral wheezes LL Abdominal:     General: Abdomen is flat. There is distension.     Palpations: Abdomen is soft.  Musculoskeletal:        General: Normal range of motion.     Cervical back: Normal range of motion and neck supple.  Skin:    General: Skin is warm and dry.  Neurological:     Mental Status: He is alert and oriented to person, place, and time.  Psychiatric:        Mood and Affect: Mood normal.        Behavior: Behavior normal.      Assessment:  Clide was seen today for hospitalization follow-up.  Diagnoses and all orders for this visit:  Asthma with COPD with exacerbation (  [facility]) -     Ambulatory referral to Pulmonology  Hospital discharge follow-up See HPI- information provided for urologist     This note has been created with Dragon speech recognition software and smart phrase technology. Any transcriptional errors are unintentional.   No follow-ups on file.  [name], NP 04/11/2023, 4:22 PM

## 2023-04-18 ENCOUNTER — Other Ambulatory Visit (INDEPENDENT_AMBULATORY_CARE_PROVIDER_SITE_OTHER): Payer: Self-pay | Admitting: Primary Care

## 2023-04-18 DIAGNOSIS — J441 Chronic obstructive pulmonary disease with (acute) exacerbation: Secondary | ICD-10-CM

## 2023-04-18 NOTE — Telephone Encounter (Signed)
Medication Refill -  Most Recent Primary Care Visit:  Provider: [name]  Department: [facility]  Visit Type: HOSPITAL FU  Date: 04/11/2023  Medication: albuterol (VENTOLIN HFA) 108 (90 Base) MCG/ACT inhale   Has the patient contacted their pharmacy? no (Agent: If yes, when and what did the pharmacy advise?)the pharmacy contacted pt and was told to call us for the refill  Is this the correct pharmacy for this prescription? yes  This is the patient's preferred pharmacy:  [facility] - [place] ([place]), [place] - [address] [place] ([place]) [place] [address] Phone: [phone] Fax: [phone]    Has the prescription been filled recently? no  Is the patient out of the medication? yes  Has the patient been seen for an appointment in the last year OR does the patient have an upcoming appointment? yes  Can we respond through MyChart? yes  Agent: Please be advised that Rx refills may take up to 3 business days. We ask that you follow-up with your pharmacy.

## 2023-04-19 ENCOUNTER — Other Ambulatory Visit: Payer: Self-pay

## 2023-04-19 ENCOUNTER — Ambulatory Visit
Admission: EM | Admit: 2023-04-19 | Discharge: 2023-04-19 | Disposition: A | Discharge status: Home / Self Care | Payer: Medicaid Other | Attending: Physician Assistant | Admitting: Physician Assistant

## 2023-04-19 ENCOUNTER — Encounter: Payer: Self-pay | Admitting: Physician Assistant

## 2023-04-19 DIAGNOSIS — J45901 Unspecified asthma with (acute) exacerbation: Secondary | ICD-10-CM

## 2023-04-19 DIAGNOSIS — J441 Chronic obstructive pulmonary disease with (acute) exacerbation: Secondary | ICD-10-CM | POA: Diagnosis not present

## 2023-04-19 LAB — POC COVID19/FLU A&B COMBO
Covid Antigen, POC: NEGATIVE
Influenza A Antigen, POC: NEGATIVE
Influenza B Antigen, POC: NEGATIVE

## 2023-04-19 MED ORDER — ALBUTEROL SULFATE HFA 108 (90 BASE) MCG/ACT IN AERS: 2.0000 | INHALATION_SPRAY | Freq: Four times a day (QID) | RESPIRATORY_TRACT | 0 refills | Status: DC | PRN

## 2023-04-19 MED ORDER — ALBUTEROL SULFATE (2.5 MG/3ML) 0.083% IN NEBU
2.5000 mg | INHALATION_SOLUTION | Freq: Once | RESPIRATORY_TRACT | Status: AC
Start: 2023-04-19 — End: 2023-04-19
  Administered 2023-04-19: 2.5 mg via RESPIRATORY_TRACT

## 2023-04-19 MED ORDER — PREDNISONE 20 MG PO TABS: 40.0000 mg | ORAL_TABLET | Freq: Every day | ORAL | 0 refills | Status: AC

## 2023-04-19 MED ORDER — IPRATROPIUM-ALBUTEROL 0.5-2.5 (3) MG/3ML IN SOLN
3.0000 mL | Freq: Once | RESPIRATORY_TRACT | Status: AC
  Administered 2023-04-19: 3 mL via RESPIRATORY_TRACT

## 2023-04-19 NOTE — ED Notes (Signed)
PT's SPO2 98 post Neb treatment

## 2023-04-19 NOTE — ED Provider Notes (Signed)
[facility]    CSN: [id number] Arrival date & time: 04/19/23  0930      History   Chief Complaint Chief Complaint  Patient presents with   Asthma    HPI Anthony Finley is a 52 y.o. male.   Patient here today for evaluation of asthma attack that occurred on his way to work today.  He did not have his inhaler but he has had more wheezing since that time.  He has not had fever.  The history is provided by the patient.  Asthma Associated symptoms include shortness of breath.    Past Medical History:  Diagnosis Date   Anxiety    Asthma    mostly as child-uses inh about q 3-4 months   COPD (chronic obstructive pulmonary disease) (HCC)    Hyperlipidemia    Thrombocytopenia (HCC)    Tobacco abuse     Patient Active Problem List   Diagnosis Date Noted   Substance abuse (HCC) 12/19/2022   Acute respiratory failure with hypoxia (HCC) 12/19/2022   Acute exacerbation of chronic obstructive pulmonary disease (COPD) (HCC) 10/05/2021   Hyperglycemia 10/05/2021   Elevated serum creatinine 10/05/2021   Class 1 obesity 10/05/2021   Leukocytosis 10/05/2021   Left leg pain 03/17/2021   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   Asthma with COPD with exacerbation (HCC) 10/18/2017   Ischemic chest pain (HCC) 10/18/2017   Tobacco abuse 10/18/2017   Thrombocytopenia (HCC) 10/18/2017    Past Surgical History:  Procedure Laterality Date   FRACTURE SURGERY     orif fx lt ankle age 19   HAND SURGERY Left 08/2015   ORIF ANKLE FRACTURE  02/22/2012   Procedure: OPEN REDUCTION INTERNAL FIXATION (ORIF) ANKLE FRACTURE;  Surgeon: Toni Arthurs, MD;  Location: [facility];  Service: Orthopedics;  Laterality: Left;  Open Reduction Internal Fixation Left Ankle Bimalleolar Fracture;   Open Reduction Internal Fixation of Syndesmosis, and Repair of Deltoid Ligament   PERCUTANEOUS PINNING Left 08/11/2015   Procedure: PINNING OF LEFT 4TH METACARPAL FRACTURE;   Surgeon: Mack Hook, MD;  Location: [facility];  Service: Orthopedics;  Laterality: Left;       Home Medications    Prior to Admission medications   Medication Sig Start Date End Date Taking? Authorizing Provider  albuterol (PROVENTIL) (2.5 MG/3ML) 0.083% nebulizer solution Take 3 mLs (2.5 mg total) by nebulization every 6 (six) hours as needed for wheezing or shortness of breath. 09/24/22  Yes [name], NP  fluticasone (FLOVENT HFA) 44 MCG/ACT inhaler Inhale 2 puffs into the lungs in the morning and at bedtime. 12/19/22  Yes [name], NP  predniSONE (DELTASONE) 20 MG tablet Take 2 tablets (40 mg total) by mouth daily with breakfast for 5 days. 04/19/23 04/24/23 Yes Tomi Bamberger, PA-C  albuterol (VENTOLIN HFA) 108 (90 Base) MCG/ACT inhaler Inhale 2 puffs into the lungs every 6 (six) hours as needed for wheezing or shortness of breath. 04/19/23   Tomi Bamberger, PA-C    Family History Family History  Problem Relation Age of Onset   Colon polyps Brother    Colon cancer Neg Hx    Esophageal cancer Neg Hx    Rectal cancer Neg Hx    Stomach cancer Neg Hx     Social History Social History   Tobacco Use   Smoking status: Every Day    Current packs/day: 0.50    Types: Cigarettes   Smokeless tobacco: Never  Vaping  Use   Vaping status: Never Used  Substance Use Topics   Alcohol use: Yes    Comment: occ   Drug use: No     Allergies   Cephalosporins, Fish-derived products, Keflex [cephalexin], and Penicillins   Review of Systems Review of Systems  Constitutional:  Negative for chills and fever.  HENT:  Negative for congestion.   Eyes:  Negative for discharge and redness.  Respiratory:  Positive for cough, shortness of breath and wheezing.   Neurological:  Negative for numbness.     Physical Exam Triage Vital Signs ED Triage Vitals  Encounter Vitals Group     BP 04/19/23 0939 118/84     Systolic BP Percentile --       Diastolic BP Percentile --      Pulse Rate 04/19/23 0939 (!) 105     Resp 04/19/23 0939 (!) 22     Temp 04/19/23 0939 98.2 F (36.8 C)     Temp src --      SpO2 04/19/23 0939 92 %     Weight --      Height --      Head Circumference --      Peak Flow --      Pain Score 04/19/23 0943 0     Pain Loc --      Pain Education --      Exclude from Growth Chart --    No data found.  Updated Vital Signs BP 118/84   Pulse (!) 105   Temp 98.2 F (36.8 C)   Resp (!) 22   SpO2 98%   Visual Acuity Right Eye Distance:   Left Eye Distance:   Bilateral Distance:    Right Eye Near:   Left Eye Near:    Bilateral Near:     Physical Exam Vitals and nursing note reviewed.  Constitutional:      General: He is not in acute distress.    Appearance: Normal appearance. He is not ill-appearing.  HENT:     Head: Normocephalic and atraumatic.  Eyes:     Conjunctiva/sclera: Conjunctivae normal.  Cardiovascular:     Rate and Rhythm: Normal rate.  Pulmonary:     Effort: Pulmonary effort is normal.     Breath sounds: Wheezing present.     Comments: Diffuse wheezing noted on exam, improved after nebulizer treatment Neurological:     Mental Status: He is alert.  Psychiatric:        Mood and Affect: Mood normal.        Behavior: Behavior normal.        Thought Content: Thought content normal.      UC Treatments / Results  Labs (all labs ordered are listed, but only abnormal results are displayed) Labs Reviewed  POC COVID19/FLU A&B COMBO - Normal    EKG   Radiology No results found.  Procedures Procedures (including critical care time)  Medications Ordered in UC Medications  ipratropium-albuterol (DUONEB) 0.5-2.5 (3) MG/3ML nebulizer solution 3 mL (3 mLs Nebulization Given 04/19/23 0953)  albuterol (PROVENTIL) (2.5 MG/3ML) 0.083% nebulizer solution 2.5 mg (2.5 mg Nebulization Given 04/19/23 1037)    Initial Impression / Assessment and Plan / UC Course  I have reviewed the  triage vital signs and the nursing notes.  Pertinent labs & imaging results that were available during my care of the patient were reviewed by me and considered in my medical decision making (see chart for details).    Will treat COPD exacerbation  with steroid burst and albuterol inhaler refilled.  Advised patient to not leave home without inhaler in the event she is needed.  Encouraged follow-up if no gradual improvement or with any further concerns.  Final Clinical Impressions(s) / UC Diagnoses   Final diagnoses:  COPD exacerbation [facility])   Discharge Instructions   None    ED Prescriptions     Medication Sig Dispense Auth. Provider   predniSONE (DELTASONE) 20 MG tablet Take 2 tablets (40 mg total) by mouth daily with breakfast for 5 days. 10 tablet Erma Pinto F, PA-C   albuterol (VENTOLIN HFA) 108 (90 Base) MCG/ACT inhaler Inhale 2 puffs into the lungs every 6 (six) hours as needed for wheezing or shortness of breath. 18 g Tomi Bamberger, PA-C      PDMP not reviewed this encounter.   Tomi Bamberger, PA-C 04/24/23 1555

## 2023-04-19 NOTE — ED Triage Notes (Signed)
Pt reports while going to work this AM he had a asthma attack. Pt did not have his inhaler with him. Pt wheezing on arrival to room.

## 2023-04-24 ENCOUNTER — Encounter: Payer: Self-pay | Admitting: Physician Assistant

## 2023-05-11 ENCOUNTER — Other Ambulatory Visit (INDEPENDENT_AMBULATORY_CARE_PROVIDER_SITE_OTHER): Payer: Self-pay | Admitting: Primary Care

## 2023-05-11 DIAGNOSIS — J441 Chronic obstructive pulmonary disease with (acute) exacerbation: Secondary | ICD-10-CM

## 2023-05-11 NOTE — Telephone Encounter (Signed)
 Medication Refill -  Most Recent Primary Care Visit:  Provider: CELESTIA ROSALINE SQUIBB  Department: [facility]  Visit Type: HOSPITAL FU  Date: 04/11/2023  Medication: albuterol  (VENTOLIN  HFA) 108 (90 Base) MCG/ACT inhaler   Has the patient contacted their pharmacy? Yes   Is this the correct pharmacy for this prescription? Yes If no, delete pharmacy and type the correct one.  This is the patient's preferred pharmacy:   [facility] [address] [place]), [place] - [address] Phone: [phone]  Fax: [phone]       Has the prescription been filled recently? Yes a few weeks ago  Is the patient out of the medication? Yes  Has the patient been seen for an appointment in the last year OR does the patient have an upcoming appointment? Yes  Can we respond through MyChart? Yes    The patient states he contacted his pharmacy on Monday and was told they were going to contacted his provider for the refill. Please assist patient further and if possible please put in extra refills.

## 2023-05-13 NOTE — Telephone Encounter (Signed)
 Requested medication (s) are due for refill today: Yes  Requested medication (s) are on the active medication list: Yes  Last refill:  04/19/23  Future visit scheduled: Yes  Notes to clinic:  Unable to refill per protocol, last refill by another provider.      Requested Prescriptions  Pending Prescriptions Disp Refills   albuterol  (VENTOLIN  HFA) 108 (90 Base) MCG/ACT inhaler 18 g 0    Sig: Inhale 2 puffs into the lungs every 6 (six) hours as needed for wheezing or shortness of breath.     Pulmonology:  Beta Agonists 2 Passed - 05/13/2023  6:45 PM      Passed - Last BP in normal range    BP Readings from Last 1 Encounters:  04/19/23 118/84         Passed - Last Heart Rate in normal range    Pulse Readings from Last 1 Encounters:  04/19/23 (!) 105         Passed - Valid encounter within last 12 months    Recent Outpatient Visits           1 month ago Asthma with COPD with exacerbation (HCC)   [place] [facility] Anthony Rosaline SQUIBB, NP   4 months ago Asthma with COPD with exacerbation [facility])   [place] [facility] Anthony Rosaline SQUIBB, NP   8 months ago Need for shingles vaccine   [place] [facility] Anthony Rosaline SQUIBB, NP   2 years ago Left leg pain   [place] [facility], [place] RAMAN, NP   2 years ago Encounter to establish care   [place] [facility] Anthony Rosaline SQUIBB, NP       Future Appointments             In 1 month Anthony, Rosaline SQUIBB, NP [place] [facility]

## 2023-05-16 MED ORDER — ALBUTEROL SULFATE HFA 108 (90 BASE) MCG/ACT IN AERS: 2.0000 | INHALATION_SPRAY | Freq: Four times a day (QID) | RESPIRATORY_TRACT | 0 refills | Status: DC | PRN

## 2023-06-08 ENCOUNTER — Other Ambulatory Visit (INDEPENDENT_AMBULATORY_CARE_PROVIDER_SITE_OTHER): Payer: Self-pay | Admitting: Primary Care

## 2023-06-08 DIAGNOSIS — J441 Chronic obstructive pulmonary disease with (acute) exacerbation: Secondary | ICD-10-CM

## 2023-06-08 NOTE — Telephone Encounter (Signed)
 Copied from CRM [id number]. Topic: Clinical - Medication Refill >> Jun 08, 2023 10:09 AM Carlatta H wrote: Most Recent Primary Care Visit:  Provider: CELESTIA ROSALINE SQUIBB  [facility]  Visit Type: HOSPITAL FU  Date: 04/11/2023  Medication: albuterol  (VENTOLIN  HFA) 108 (90 Base) MCG/ACT inhaler [[id number]]  Has the patient contacted their pharmacy? No (Agent: If no, request that the patient contact the pharmacy for the refill. If patient does not wish to contact the pharmacy document the reason why and proceed with request.) (Agent: If yes, when and what did the pharmacy advise?)  Is this the correct pharmacy for this prescription? Yes If no, delete pharmacy and type the correct one.  This is the patient's preferred pharmacy:  [facility] - [place] ([place]), [place] - [address] [place] ([place]) [place] [address] Phone: [phone] Fax: [phone]     Has the prescription been filled recently? No  Is the patient out of the medication? Yes  Has the patient been seen for an appointment in the last year OR does the patient have an upcoming appointment? Yes  Can we respond through MyChart? NO  Agent: Please be advised that Rx refills may take up to 3 business days. We ask that you follow-up with your pharmacy.

## 2023-06-09 MED ORDER — ALBUTEROL SULFATE HFA 108 (90 BASE) MCG/ACT IN AERS: 2.0000 | INHALATION_SPRAY | Freq: Four times a day (QID) | RESPIRATORY_TRACT | 0 refills | Status: DC | PRN

## 2023-06-10 ENCOUNTER — Other Ambulatory Visit (INDEPENDENT_AMBULATORY_CARE_PROVIDER_SITE_OTHER): Payer: Self-pay | Admitting: Primary Care

## 2023-06-10 DIAGNOSIS — J441 Chronic obstructive pulmonary disease with (acute) exacerbation: Secondary | ICD-10-CM

## 2023-06-10 NOTE — Telephone Encounter (Signed)
 Called pharmacy to confirm receipt from 06/09/23 at 8:40 pm. Pharmacy staff report they have received Rx request and are working on it now.

## 2023-06-10 NOTE — Telephone Encounter (Signed)
 Requested by interface surescripts. Receipt confirmed by pharmacy 06/09/23 at 8:40 pm. Duplicate request.  Requested Prescriptions  Refused Prescriptions Disp Refills   VENTOLIN  HFA 108 (90 Base) MCG/ACT inhaler [Pharmacy Med Name: Ventolin  HFA 108 (90 Base) MCG/ACT Inhalation Aerosol Solution] 18 g 0    Sig: INHALE 2 PUFFS BY MOUTH EVERY 6 HOURS AS NEEDED FOR WHEEZING FOR SHORTNESS OF BREATH     Pulmonology:  Beta Agonists 2 Passed - 06/10/2023 10:29 AM      Passed - Last BP in normal range    BP Readings from Last 1 Encounters:  04/19/23 118/84         Passed - Last Heart Rate in normal range    Pulse Readings from Last 1 Encounters:  04/19/23 (!) 105         Passed - Valid encounter within last 12 months    Recent Outpatient Visits           2 months ago Asthma with COPD with exacerbation (HCC)   [place] [facility] Celestia Rosaline SQUIBB, NP   5 months ago Asthma with COPD with exacerbation [facility])   [place] [facility] Celestia Rosaline SQUIBB, NP   9 months ago Need for shingles vaccine   [place] [facility] Celestia Rosaline SQUIBB, NP   2 years ago Left leg pain   [place] [facility], [place] RAMAN, NP   2 years ago Encounter to establish care   [place] [facility] Celestia Rosaline SQUIBB, NP       Future Appointments             In 1 month Celestia, Rosaline SQUIBB, NP [place] [facility]

## 2023-07-01 ENCOUNTER — Other Ambulatory Visit (INDEPENDENT_AMBULATORY_CARE_PROVIDER_SITE_OTHER): Payer: Self-pay | Admitting: Primary Care

## 2023-07-01 DIAGNOSIS — J441 Chronic obstructive pulmonary disease with (acute) exacerbation: Secondary | ICD-10-CM

## 2023-07-01 NOTE — Telephone Encounter (Signed)
 Copied from CRM [id number]. Topic: Clinical - Medication Refill >> Jul 01, 2023  3:01 PM Carlatta H wrote: Most Recent Primary Care Visit:  Provider: [name]  [facility]  Visit Type: HOSPITAL FU  Date: 04/11/2023  Medication: albuterol (VENTOLIN HFA) 108 (90 Base) MCG/ACT inhaler [[id number]] albuterol (PROVENTIL) (2.5 MG/3ML) 0.083% nebulizer solution [[id number]]  Has the patient contacted their pharmacy? No (Agent: If no, request that the patient contact the pharmacy for the refill. If patient does not wish to contact the pharmacy document the reason why and proceed with request.) (Agent: If yes, when and what did the pharmacy advise?)  Is this the correct pharmacy for this prescription? Yes If no, delete pharmacy and type the correct one.  This is the patient's preferred pharmacy:  [facility] - [place] ([place]), [place] - [address] [place] ([place]) [place] [address] Phone: [phone] Fax: [phone]     Has the prescription been filled recently? No  Is the patient out of the medication? Yes  Has the patient been seen for an appointment in the last year OR does the patient have an upcoming appointment? Yes  Can we respond through MyChart? Yes  Agent: Please be advised that Rx refills may take up to 3 business days. We ask that you follow-up with your pharmacy.

## 2023-07-04 MED ORDER — ALBUTEROL SULFATE HFA 108 (90 BASE) MCG/ACT IN AERS: 2.0000 | INHALATION_SPRAY | Freq: Four times a day (QID) | RESPIRATORY_TRACT | 0 refills | Status: DC | PRN

## 2023-07-04 MED ORDER — ALBUTEROL SULFATE (2.5 MG/3ML) 0.083% IN NEBU: 2.5000 mg | INHALATION_SOLUTION | Freq: Four times a day (QID) | RESPIRATORY_TRACT | 0 refills | Status: AC | PRN

## 2023-07-04 NOTE — Telephone Encounter (Signed)
 Requested Prescriptions  Pending Prescriptions Disp Refills   albuterol (VENTOLIN HFA) 108 (90 Base) MCG/ACT inhaler 18 g 0    Sig: Inhale 2 puffs into the lungs every 6 (six) hours as needed for wheezing or shortness of breath.     Pulmonology:  Beta Agonists 2 Passed - 07/04/2023 12:48 PM      Passed - Last BP in normal range    BP Readings from Last 1 Encounters:  04/19/23 118/84         Passed - Last Heart Rate in normal range    Pulse Readings from Last 1 Encounters:  04/19/23 (!) 105         Passed - Valid encounter within last 12 months    Recent Outpatient Visits           2 months ago Asthma with COPD with exacerbation (HCC)   [place] [facility] [name], NP   6 months ago Asthma with COPD with exacerbation [facility])   [place] [facility] [name], NP   10 months ago Need for shingles vaccine   [place] [facility] [name], NP   2 years ago Left leg pain   [place] [facility], Gildardo Pounds, NP   3 years ago Encounter to establish care   [place] [facility] [name], NP       Future Appointments             In 1 week [name], NP [place] [facility]             albuterol (PROVENTIL) (2.5 MG/3ML) 0.083% nebulizer solution 150 mL 0    Sig: Take 3 mLs (2.5 mg total) by nebulization every 6 (six) hours as needed for wheezing or shortness of breath.     Pulmonology:  Beta Agonists 2 Passed - 07/04/2023 12:48 PM      Passed - Last BP in normal range    BP Readings from Last 1 Encounters:  04/19/23 118/84         Passed - Last Heart Rate in normal range    Pulse Readings from Last 1 Encounters:  04/19/23 (!) 105         Passed - Valid encounter within last 12 months    Recent Outpatient Visits           2 months ago Asthma with COPD with exacerbation (HCC)   [place] [facility] [name], NP   6 months ago Asthma with COPD with exacerbation [facility])   [place] [facility] [name], NP   10 months ago Need for shingles vaccine   [place] [facility] [name], NP   2 years ago Left leg pain   [place] [facility], Gildardo Pounds, NP   3 years ago Encounter to establish care   [place] [facility] [name], NP       Future Appointments             In 1 week Randa Evens, Kinnie Scales, NP  [facility]

## 2023-07-11 ENCOUNTER — Ambulatory Visit (INDEPENDENT_AMBULATORY_CARE_PROVIDER_SITE_OTHER): Payer: Medicaid Other | Admitting: Primary Care

## 2023-07-14 ENCOUNTER — Other Ambulatory Visit (INDEPENDENT_AMBULATORY_CARE_PROVIDER_SITE_OTHER): Payer: Self-pay | Admitting: Primary Care

## 2023-07-14 DIAGNOSIS — J441 Chronic obstructive pulmonary disease with (acute) exacerbation: Secondary | ICD-10-CM

## 2023-07-14 NOTE — Telephone Encounter (Signed)
 Requested medication (s) are due for refill today: Yes  Requested medication (s) are on the active medication list: Yes  Last refill:  12/19/22  Future visit scheduled: No  Notes to clinic:  Unable to refill per protocol, cannot delegate.      Requested Prescriptions  Pending Prescriptions Disp Refills   fluticasone (FLOVENT HFA) 44 MCG/ACT inhaler 1 each 6    Sig: Inhale 2 puffs into the lungs in the morning and at bedtime.     Not Delegated - Pulmonology:  Corticosteroids 2 Failed - 07/14/2023 12:16 PM      Failed - This refill cannot be delegated      Passed - Valid encounter within last 12 months    Recent Outpatient Visits           3 months ago Asthma with COPD with exacerbation (HCC)   [place] [facility] [name], NP   7 months ago Asthma with COPD with exacerbation [facility])   [place] [facility] [name], NP   10 months ago Need for shingles vaccine   [place] [facility] [name], NP   2 years ago Left leg pain   [place] [facility], Gildardo Pounds, NP   3 years ago Encounter to establish care   [place] [facility] [name], NP

## 2023-07-14 NOTE — Telephone Encounter (Signed)
 Copied from CRM [id number]. Topic: Clinical - Medication Refill >> Jul 14, 2023 11:05 AM Fuller Mandril wrote: Most Recent Primary Care Visit:  Provider: [name]  [facility]  Visit Type: HOSPITAL FU  Date: 04/11/2023  Medication: fluticasone (FLOVENT HFA) 44 MCG/ACT inhaler   Has the patient contacted their pharmacy? Yes (Agent: If no, request that the patient contact the pharmacy for the refill. If patient does not wish to contact the pharmacy document the reason why and proceed with request.) (Agent: If yes, when and what did the pharmacy advise?) was not able to refill   Is this the correct pharmacy for this prescription? Yes If no, delete pharmacy and type the correct one.  This is the patient's preferred pharmacy:  [facility] - [place] ([place]), [place] - [address]  ([place]) [place] [address] Phone: [phone] Fax: [phone]  Has the prescription been filled recently? No  Is the patient out of the medication? Yes  Has the patient been seen for an appointment in the last year OR does the patient have an upcoming appointment? Yes  Can we respond through MyChart? Yes  Agent: Please be advised that Rx refills may take up to 3 business days. We ask that you follow-up with your pharmacy.

## 2023-07-17 MED ORDER — FLUTICASONE PROPIONATE HFA 44 MCG/ACT IN AERO
2.0000 | INHALATION_SPRAY | Freq: Two times a day (BID) | RESPIRATORY_TRACT | 6 refills | Status: AC
Start: 2023-07-17 — End: ?

## 2023-07-25 ENCOUNTER — Encounter (INDEPENDENT_AMBULATORY_CARE_PROVIDER_SITE_OTHER): Admitting: Primary Care

## 2023-07-25 ENCOUNTER — Encounter (INDEPENDENT_AMBULATORY_CARE_PROVIDER_SITE_OTHER): Payer: Self-pay

## 2023-07-25 NOTE — Progress Notes (Signed)
error 

## 2023-08-03 ENCOUNTER — Other Ambulatory Visit (INDEPENDENT_AMBULATORY_CARE_PROVIDER_SITE_OTHER): Payer: Self-pay | Admitting: Primary Care

## 2023-08-03 DIAGNOSIS — J441 Chronic obstructive pulmonary disease with (acute) exacerbation: Secondary | ICD-10-CM

## 2023-08-04 NOTE — Telephone Encounter (Signed)
 Requested Prescriptions  Pending Prescriptions Disp Refills   VENTOLIN HFA 108 (90 Base) MCG/ACT inhaler [Pharmacy Med Name: Ventolin HFA 108 (90 Base) MCG/ACT Inhalation Aerosol Solution] 18 g 0    Sig: INHALE 2 PUFFS BY MOUTH EVERY 6 HOURS AS NEEDED FOR WHEEZING FOR SHORTNESS OF BREATH     Pulmonology:  Beta Agonists 2 Passed - 08/04/2023  4:00 PM      Passed - Last BP in normal range    BP Readings from Last 1 Encounters:  04/19/23 118/84         Passed - Last Heart Rate in normal range    Pulse Readings from Last 1 Encounters:  04/19/23 (!) 105         Passed - Valid encounter within last 12 months    Recent Outpatient Visits           3 months ago Asthma with COPD with exacerbation (HCC)   [place] [facility] [name], NP   7 months ago Asthma with COPD with exacerbation [facility])   [place] [facility] [name], NP   11 months ago Need for shingles vaccine   [place] [facility] [name], NP   2 years ago Left leg pain   [place] [facility], Gildardo Pounds, NP   3 years ago Encounter to establish care   [place] [facility] [name], NP

## 2023-09-27 IMAGING — DX DG TIBIA/FIBULA 2V*L*
4 series · 4 of 4 positions shown · non-contrast
Comparison: None.

CLINICAL DATA: Lower leg pain.

EXAM:
LEFT TIBIA AND FIBULA - 2 VIEW

[lower leg ap (1 of 2)]
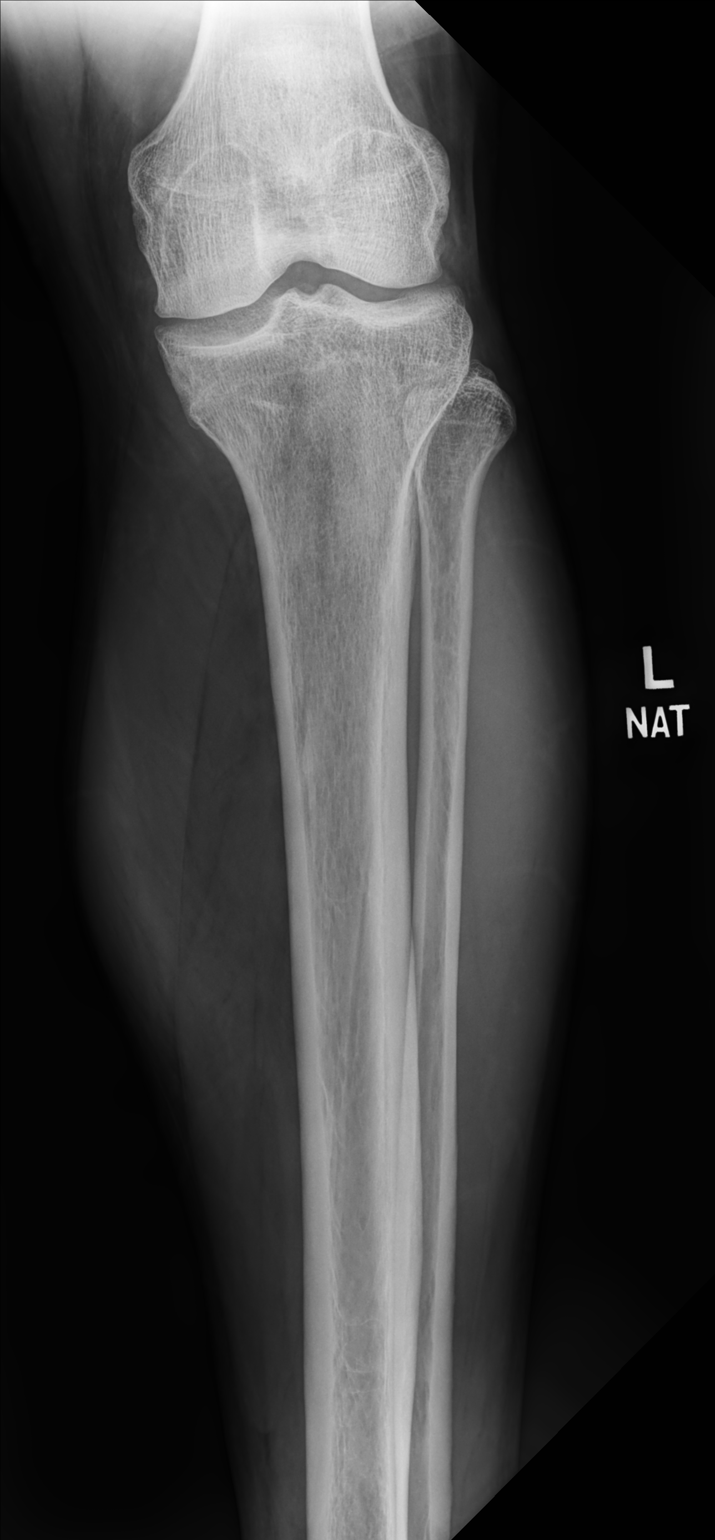

[lower leg ap (2 of 2)]
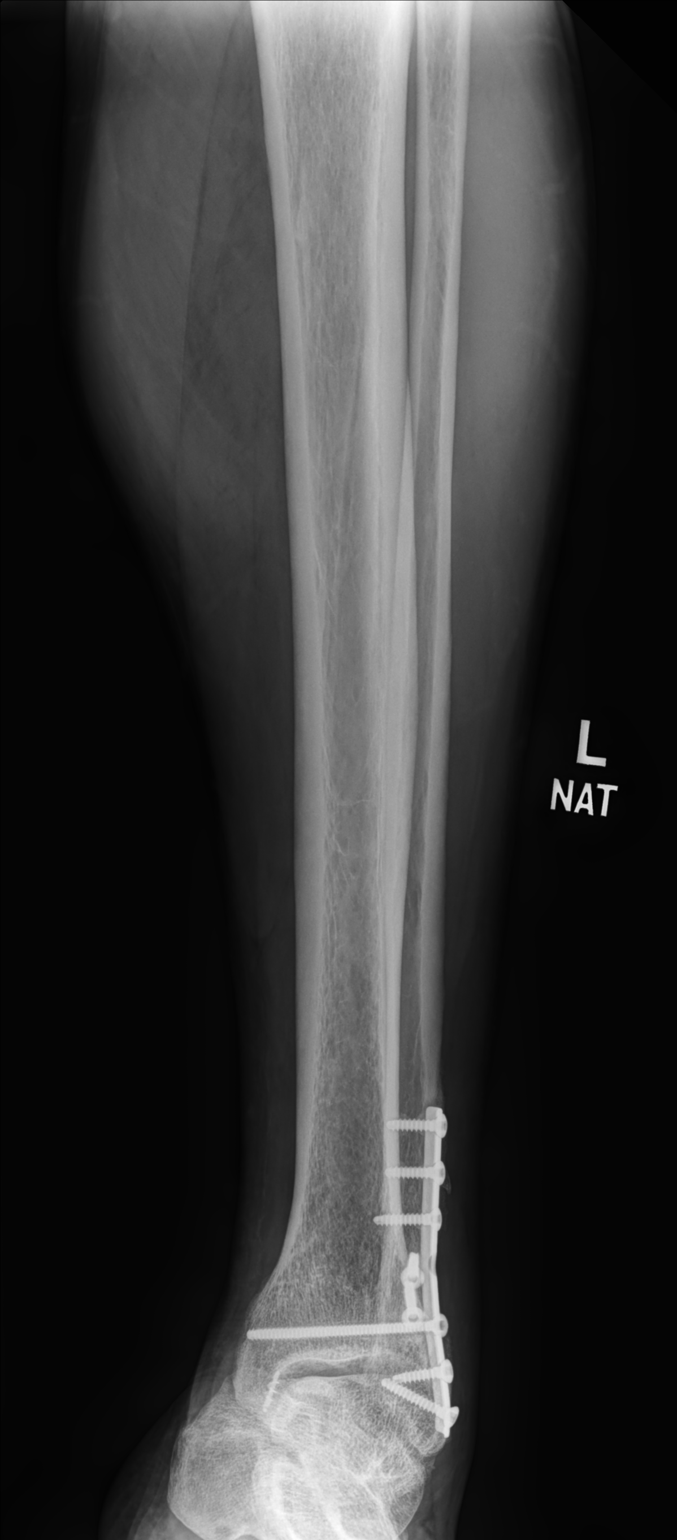

[lower leg lat (1 of 2)]
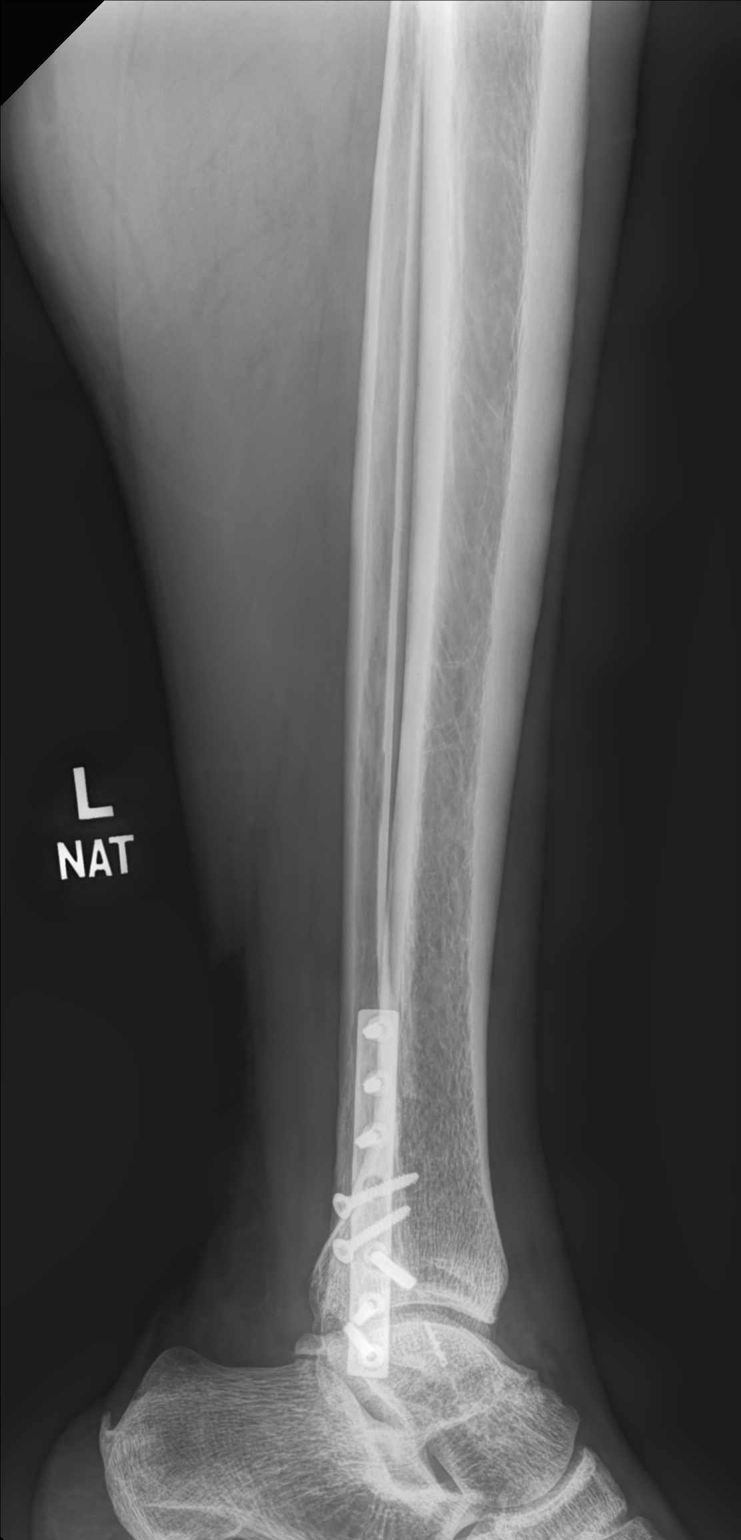

[lower leg lat (2 of 2)]
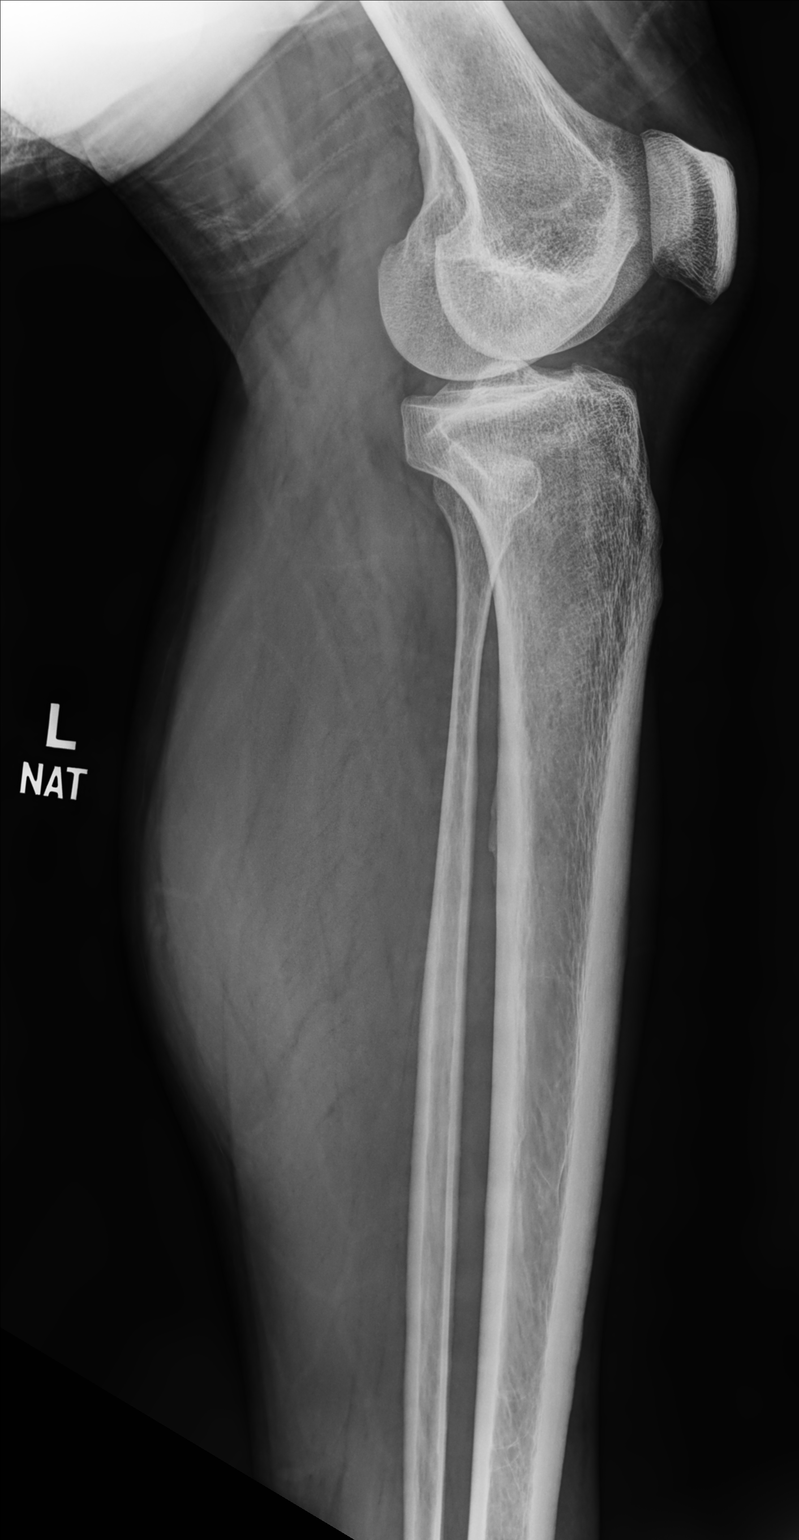

[4 of 4 positions shown; findings below may reference images not displayed]

FINDINGS: Lateral distal fibular sideplate and screws are present. There is
also a screw traversing the distal tibia. There is no evidence for
hardware loosening. There is no acute fracture or dislocation. No
cortical erosions are identified. Joint spaces are maintained. Soft
tissues are within normal limits.
IMPRESSION: 1. No acute bony abnormality.
2. Distal tibial and fibular hardware appears uncomplicated.

## 2023-11-16 ENCOUNTER — Other Ambulatory Visit (INDEPENDENT_AMBULATORY_CARE_PROVIDER_SITE_OTHER): Payer: Self-pay | Admitting: Primary Care

## 2023-11-16 DIAGNOSIS — J441 Chronic obstructive pulmonary disease with (acute) exacerbation: Secondary | ICD-10-CM

## 2023-11-16 NOTE — Telephone Encounter (Signed)
 Copied from CRM [id number]. Topic: Clinical - Medication Refill >> Nov 16, 2023  1:00 PM Carla L wrote: Medication: VENTOLIN  HFA 108 (90 Base) MCG/ACT inhaler  Has the patient contacted their pharmacy? Yes Told to contact the office.   This is the patient's preferred pharmacy:  [facility] - [place] ([place] - [address] [place] ([place])  [address] Phone: [phone] Fax: [phone]  Is this the correct pharmacy for this prescription? Yes  Has the prescription been filled recently? No  Is the patient out of the medication? Yes  Has the patient been seen for an appointment in the last year OR does the patient have an upcoming appointment? Yes  Can we respond through MyChart? No  Agent: Please be advised that Rx refills may take up to 3 business days. We ask that you follow-up with your pharmacy.

## 2023-11-17 NOTE — Telephone Encounter (Signed)
 Requested Prescriptions  Pending Prescriptions Disp Refills   VENTOLIN  HFA 108 (90 Base) MCG/ACT inhaler [Pharmacy Med Name: Ventolin  HFA 108 (90 Base) MCG/ACT Inhalation Aerosol Solution] 18 g 2    Sig: INHALE 2 PUFFS BY MOUTH EVERY 6 HOURS AS NEEDED FOR WHEEZING FOR SHORTNESS OF BREATH     Pulmonology:  Beta Agonists 2 Passed - 11/17/2023  4:49 PM      Passed - Last BP in normal range    BP Readings from Last 1 Encounters:  04/19/23 118/84         Passed - Last Heart Rate in normal range    Pulse Readings from Last 1 Encounters:  04/19/23 (!) 105         Passed - Valid encounter within last 12 months    Recent Outpatient Visits           7 months ago Asthma with COPD with exacerbation (HCC)   [place] [facility] Celestia Rosaline SQUIBB, NP   11 months ago Asthma with COPD with exacerbation [facility])   [place] [facility] Celestia Rosaline SQUIBB, NP   1 year ago Need for shingles vaccine   [place] [facility] Celestia Rosaline SQUIBB, NP   2 years ago Left leg pain   [place] [facility], [place] RAMAN, NP   3 years ago Encounter to establish care    [facility] Celestia Rosaline SQUIBB, NP

## 2023-11-17 NOTE — Telephone Encounter (Signed)
 Requested Prescriptions  Refused Prescriptions Disp Refills   VENTOLIN  HFA 108 (90 Base) MCG/ACT inhaler 18 g 2     Pulmonology:  Beta Agonists 2 Passed - 11/17/2023  4:53 PM      Passed - Last BP in normal range    BP Readings from Last 1 Encounters:  04/19/23 118/84         Passed - Last Heart Rate in normal range    Pulse Readings from Last 1 Encounters:  04/19/23 (!) 105         Passed - Valid encounter within last 12 months    Recent Outpatient Visits           7 months ago Asthma with COPD with exacerbation (HCC)   [place] [facility] Celestia Rosaline SQUIBB, NP   11 months ago Asthma with COPD with exacerbation [facility])   [place] [facility] Celestia Rosaline SQUIBB, NP   1 year ago Need for shingles vaccine   [place] [facility] Celestia Rosaline SQUIBB, NP   2 years ago Left leg pain   [place] [facility], [place] RAMAN, NP   3 years ago Encounter to establish care   [place] [facility] Celestia Rosaline SQUIBB, NP

## 2023-12-08 ENCOUNTER — Ambulatory Visit: Payer: Self-pay

## 2023-12-08 NOTE — Telephone Encounter (Signed)
 FYI Only or Action Required?: FYI only for provider.  Patient was last seen in primary care on 04/11/2023 by Celestia Rosaline SQUIBB, NP.  Called Nurse Triage reporting No chief complaint on file..  Symptoms began about a month ago.  Interventions attempted: Rest, hydration, or home remedies.  Symptoms are: gradually worsening.  Triage Disposition: See PCP Within 2 Weeks  Patient/caregiver understands and will follow disposition?: Yes Reason for Disposition  Urination is difficult to start (i.e., hesitancy) or straining  Answer Assessment - Initial Assessment Questions Triaged to UC d/t no in office appointment availability. Provided patient with location, hours and approximate wait time for [facility] at [facility].  1. SYMPTOM: What's the main symptom you're concerned about? (e.g., frequency, incontinence)     Decreased urine stream and constipation  2. ONSET:     3-4 weeks  4. CAUSE: What do you think is causing the symptoms?     Enlarged prostate  Protocols used: Urinary Symptoms-A-AH Copied from CRM #[id number]. Topic: Clinical - Red Word Triage >> Dec 08, 2023  2:55 PM Deleta RAMAN wrote: Red Word that prompted transfer to Nurse Triage: patient is having trouble with urinary and constipation

## 2023-12-26 ENCOUNTER — Other Ambulatory Visit: Payer: Self-pay

## 2023-12-26 ENCOUNTER — Encounter (HOSPITAL_COMMUNITY): Payer: Self-pay

## 2023-12-26 ENCOUNTER — Emergency Department (HOSPITAL_COMMUNITY)
Admission: EM | Admit: 2023-12-26 | Discharge: 2023-12-26 | Disposition: A | Discharge status: Home / Self Care | Attending: Emergency Medicine | Admitting: Emergency Medicine

## 2023-12-26 ENCOUNTER — Emergency Department (HOSPITAL_COMMUNITY)

## 2023-12-26 DIAGNOSIS — R509 Fever, unspecified: Secondary | ICD-10-CM | POA: Diagnosis present

## 2023-12-26 DIAGNOSIS — U071 COVID-19: Secondary | ICD-10-CM | POA: Insufficient documentation

## 2023-12-26 DIAGNOSIS — J4540 Moderate persistent asthma, uncomplicated: Secondary | ICD-10-CM | POA: Diagnosis not present

## 2023-12-26 DIAGNOSIS — D696 Thrombocytopenia, unspecified: Secondary | ICD-10-CM

## 2023-12-26 LAB — CBC WITH DIFFERENTIAL/PLATELET
Abs Immature Granulocytes: 0.07 K/uL (ref 0.00–0.07)
Basophils Absolute: 0 K/uL (ref 0.0–0.1)
Basophils Relative: 0 %
Eosinophils Absolute: 0.1 K/uL (ref 0.0–0.5)
Eosinophils Relative: 1 %
HCT: 42.7 % (ref 39.0–52.0)
Hemoglobin: 14.1 g/dL (ref 13.0–17.0)
Immature Granulocytes: 1 %
Lymphocytes Relative: 9 %
Lymphs Abs: 0.8 K/uL (ref 0.7–4.0)
MCH: 29.6 pg (ref 26.0–34.0)
MCHC: 33 g/dL (ref 30.0–36.0)
MCV: 89.5 fL (ref 80.0–100.0)
Monocytes Absolute: 1.2 K/uL — ABNORMAL HIGH (ref 0.1–1.0)
Monocytes Relative: 13 %
Neutro Abs: 6.8 K/uL (ref 1.7–7.7)
Neutrophils Relative %: 76 %
Platelets: 42 K/uL — ABNORMAL LOW (ref 150–400)
RBC: 4.77 MIL/uL (ref 4.22–5.81)
RDW: 13.1 % (ref 11.5–15.5)
Smear Review: NORMAL
WBC: 8.9 K/uL (ref 4.0–10.5)
nRBC: 0 % (ref 0.0–0.2)

## 2023-12-26 LAB — COMPREHENSIVE METABOLIC PANEL WITH GFR
ALT: 27 U/L (ref 0–44)
AST: 23 U/L (ref 15–41)
Albumin: 3.5 g/dL (ref 3.5–5.0)
Alkaline Phosphatase: 45 U/L (ref 38–126)
Anion gap: 6 (ref 5–15)
BUN: 10 mg/dL (ref 6–20)
CO2: 23 mmol/L (ref 22–32)
Calcium: 8.8 mg/dL — ABNORMAL LOW (ref 8.9–10.3)
Chloride: 104 mmol/L (ref 98–111)
Creatinine, Ser: 1.03 mg/dL (ref 0.61–1.24)
GFR, Estimated: >60 mL/min (ref >60)
Glucose, Bld: 127 mg/dL — ABNORMAL HIGH (ref 70–99)
Potassium: 3.8 mmol/L (ref 3.5–5.1)
Sodium: 133 mmol/L — ABNORMAL LOW (ref 135–145)
Total Bilirubin: 0.5 mg/dL (ref 0.0–1.2)
Total Protein: 6.4 g/dL — ABNORMAL LOW (ref 6.5–8.1)

## 2023-12-26 LAB — LIPASE, BLOOD: Lipase: 33 U/L (ref 11–51)

## 2023-12-26 LAB — HEMOGLOBIN A1C
Hgb A1c MFr Bld: 6.7 % — ABNORMAL HIGH (ref 4.8–5.6)
Mean Plasma Glucose: 145.59 mg/dL

## 2023-12-26 LAB — RESP PANEL BY RT-PCR (RSV, FLU A&B, COVID)  RVPGX2
Influenza A by PCR: NEGATIVE
Influenza B by PCR: NEGATIVE
Resp Syncytial Virus by PCR: NEGATIVE
SARS Coronavirus 2 by RT PCR: POSITIVE — AB

## 2023-12-26 MED ORDER — IPRATROPIUM-ALBUTEROL 0.5-2.5 (3) MG/3ML IN SOLN
3.0000 mL | Freq: Once | RESPIRATORY_TRACT | Status: AC
  Administered 2023-12-26: 3 mL via RESPIRATORY_TRACT
  Filled 2023-12-26: qty 3

## 2023-12-26 MED ORDER — ACETAMINOPHEN 325 MG PO TABS
650.0000 mg | ORAL_TABLET | Freq: Once | ORAL | Status: AC | PRN
  Administered 2023-12-26: 650 mg via ORAL
  Filled 2023-12-26: qty 2

## 2023-12-26 MED ORDER — NIRMATRELVIR&RITONAVIR 300/100 20 X 150 MG & 10 X 100MG PO TBPK: 3.0000 | ORAL_TABLET | Freq: Two times a day (BID) | ORAL | 0 refills | Status: AC

## 2023-12-26 MED ORDER — SODIUM CHLORIDE 0.9 % IV BOLUS
1000.0000 mL | Freq: Once | INTRAVENOUS | Status: AC
Start: 2023-12-26 — End: 2023-12-26
  Administered 2023-12-26: 1000 mL via INTRAVENOUS

## 2023-12-26 MED ORDER — ONDANSETRON 4 MG PO TBDP: 4.0000 mg | ORAL_TABLET | ORAL | 0 refills | Status: DC | PRN

## 2023-12-26 NOTE — ED Provider Notes (Signed)
 [place] EMERGENCY DEPARTMENT AT [facility] Provider Note   CSN: [id number] Arrival date & time: 12/26/23  0840     Patient presents with: Fever   Anthony Finley is a 53 y.o. male.   HPI Patient reports that Saturday, 3 days ago he started to feel like he was getting sick but symptoms were mild.  Yesterday he reports he felt he got hit by a truck.  He developed generalized bodyaches, headache, fever and general weakness.  Patient reports he has had nasal congestion no sore throat.  Mild cough.  Patient does have history of asthma and chronically has some cough and shortness of breath.  No chest pain.  Patient endorses diffuse abdominal pain aching quality.  No vomiting or diarrhea at this point.  Patient ports he has had some constipation recently.  No skin rashes.    Prior to Admission medications   Medication Sig Start Date End Date Taking? Authorizing Provider  nirmatrelvir /ritonavir  (PAXLOVID ) 20 x 150 MG & 10 x 100MG  TBPK Take 3 tablets by mouth 2 (two) times daily for 5 days. Patient GFR is 60. Take nirmatrelvir  (150 mg) two tablets twice daily for 5 days and ritonavir  (100 mg) one tablet twice daily for 5 days. 12/26/23 12/31/23 Yes Armenta Canning, MD  ondansetron  (ZOFRAN -ODT) 4 MG disintegrating tablet Take 1 tablet (4 mg total) by mouth every 4 (four) hours as needed. 12/26/23  Yes Armenta Canning, MD  albuterol  (PROVENTIL ) (2.5 MG/3ML) 0.083% nebulizer solution Take 3 mLs (2.5 mg total) by nebulization every 6 (six) hours as needed for wheezing or shortness of breath. 07/04/23   Celestia Rosaline SQUIBB, NP  fluticasone  (FLOVENT  HFA) 44 MCG/ACT inhaler Inhale 2 puffs into the lungs in the morning and at bedtime. 07/17/23   Celestia Rosaline SQUIBB, NP  VENTOLIN  HFA 108 (90 Base) MCG/ACT inhaler INHALE 2 PUFFS BY MOUTH EVERY 6 HOURS AS NEEDED FOR WHEEZING FOR SHORTNESS OF BREATH 11/17/23   Celestia Rosaline SQUIBB, NP    Allergies: Cephalosporins, Fish-derived products, Keflex  [cephalexin], and Penicillins    Review of Systems  Updated Vital Signs BP 105/77   Pulse 94   Temp 99.8 F (37.7 C) (Oral)   Resp (!) 22   SpO2 98%   Physical Exam Constitutional:      Comments: Patient is resting.  Uncomfortable in appearance but alert nontoxic.  No respiratory distress.  Mental status clear.  HENT:     Head: Normocephalic and atraumatic.     Mouth/Throat:     Mouth: Mucous membranes are moist.     Pharynx: Oropharynx is clear.  Eyes:     Extraocular Movements: Extraocular movements intact.     Pupils: Pupils are equal, round, and reactive to light.  Cardiovascular:     Rate and Rhythm: Regular rhythm. Tachycardia present.  Pulmonary:     Comments: No respiratory distress.  Scattered wheeze adequate airflow to the bases. Abdominal:     Comments: Abdomen soft.  Diffusely tender.  No guarding.  Musculoskeletal:        General: No swelling or tenderness. Normal range of motion.     Cervical back: Neck supple.     Right lower leg: No edema.     Left lower leg: No edema.  Skin:    General: Skin is warm and dry.  Neurological:     General: No focal deficit present.     Mental Status: He is oriented to person, place, and time.     Motor:  No weakness.     Coordination: Coordination normal.  Psychiatric:        Mood and Affect: Mood normal.     (all labs ordered are listed, but only abnormal results are displayed) Labs Reviewed  RESP PANEL BY RT-PCR (RSV, FLU A&B, COVID)  RVPGX2 - Abnormal; Notable for the following components:      Result Value   SARS Coronavirus 2 by RT PCR POSITIVE (*)    All other components within normal limits  COMPREHENSIVE METABOLIC PANEL WITH GFR - Abnormal; Notable for the following components:   Sodium 133 (*)    Glucose, Bld 127 (*)    Calcium  8.8 (*)    Total Protein 6.4 (*)    All other components within normal limits  CBC WITH DIFFERENTIAL/PLATELET - Abnormal; Notable for the following components:   Platelets 42 (*)     Monocytes Absolute 1.2 (*)    All other components within normal limits  LIPASE, BLOOD  HEMOGLOBIN A1C    EKG: None  Radiology: DG Chest 2 View Result Date: 12/26/2023 CLINICAL DATA:  Cough and fever. EXAM: CHEST - 2 VIEW COMPARISON:  10/07/2021 FINDINGS: Emphysema with paucity of apical lung markings, apical bulla and moderate bronchial thickening. Question mild patchy airspace disease posteriorly. The heart is normal in size. No pulmonary edema, pleural effusion, or pneumothorax. IMPRESSION: Emphysema with moderate bronchial thickening. Question mild patchy airspace disease posteriorly, may be atelectasis or pneumonia. Electronically Signed   By: Andrea Gasman M.D.   On: 12/26/2023 10:30     Procedures   Medications Ordered in the ED  acetaminophen  (TYLENOL ) tablet 650 mg (650 mg Oral Given 12/26/23 0904)  sodium chloride  0.9 % bolus 1,000 mL (0 mLs Intravenous Stopped 12/26/23 1021)  ipratropium-albuterol  (DUONEB) 0.5-2.5 (3) MG/3ML nebulizer solution 3 mL (3 mLs Nebulization Given 12/26/23 1044)                                    Medical Decision Making Amount and/or Complexity of Data Reviewed Labs: ordered. Radiology: ordered.  Risk OTC drugs. Prescription drug management.   Patient presents as outlined.  He is developed febrile illness with multiple constitutional symptoms.  Will proceed with diagnostic evaluation for viral illness versus early bacterial infection\sepsis with fever and tachycardia.  Patient does have history of diabetes.  Will administer IV fluids and acetaminophen  for fever and tachycardia.  Patient's COVID is positive.    I have personally reviewed two-view chest x-ray.  X-ray reviewed by radiology report for emphysema with moderate bronchial thickening and questionable mild patchy airspace disease posteriorly.  White count 8.9 platelets 42 GFR greater than 60 glucose 127  Consult: I reviewed the patient's thrombocytopenia with Dr. Sherrod  given that the patient reports he never had follow-up with hematology.  Patient has been thrombocytopenic for a number of years.  At this time, Dr. Sherrod felt this is still appropriate for outpatient workup and he will have the office contact the patient for a follow-up appointment.  Patient is not symptomatic, has no bleeding and stable hemoglobin.  This was incidental to his visit.  Patient reports he is not diabetic.  He is not taking any diabetic medications.  I reviewed the EMR.  Patient is blood sugars were elevated at a prior hospitalization and hemoglobin A1c was done at that time at 7.  At that time, patient did not want to take diabetic medications and recommendations  were made for home care.  I have added a repeat A1c and will encourage patient to follow-up with this with his doctor and monitor closely for diabetes or prediabetes.  Patient has risk factors for complications of COVID.  At this time symptoms are moderate.  Patient does not have any respiratory distress or hypoxia.  Will start patient on Paxil bid and have advised to strictly continue all of his asthma medications with low threshold for return if any difficulty breathing.    Final diagnoses:  COVID  Fever, unspecified fever cause  Moderate persistent asthma without complication    ED Discharge Orders          Ordered    nirmatrelvir /ritonavir  (PAXLOVID ) 20 x 150 MG & 10 x 100MG  TBPK  2 times daily        12/26/23 1005    ondansetron  (ZOFRAN -ODT) 4 MG disintegrating tablet  Every 4 hours PRN        12/26/23 1005               Armenta Canning, MD 12/26/23 1052

## 2023-12-26 NOTE — Discharge Instructions (Addendum)
 Take extra strength Tylenol  per package instructions for fever. Take Zofran  if needed for nausea.  Stay well-hydrated. Continue your asthma medications. See your doctor for recheck in the next 2 to 3 days.  Some of the prior information in your chart indicates that your blood sugars were elevated and you may have risk for diabetes or prediabetes.  A repeat hemoglobin A1c has been added to your lab work.  Reviewed this with your doctor.  If you are confirmed to be diabetic by your hemoglobin A1c, is very important that you get appropriate treatment. Return to the emergency department if your symptoms are worsening or other concerning symptoms develop. Your low platelet condition, called thrombocytopenia, was reviewed with Dr. Sherrod the hematologist.  Is very important that you get your follow-up appointment scheduled and go to this to identify the cause of this condition and other needed treatment.  This puts you at increased risk for bleeding.  Return immediately if you have bleeding and feel lightheaded.

## 2023-12-26 NOTE — ED Notes (Signed)
AVS with prescriptions provided to and discussed with patient. Pt verbalizes understanding of discharge instructions and denies any questions or concerns at this time. Pt has ride home. Pt ambulated out of department independently with steady gait.

## 2023-12-26 NOTE — ED Triage Notes (Signed)
 Pt has been having a fever for the past two days. Denies having any vomiting or diarrhea. States he has been constipated. Pt c/o dizziness, cold chills, body aches.

## 2023-12-26 NOTE — ED Notes (Signed)
 Confirmed with Dr. Armenta, pt is safe for discharge and outpatient follow up with hematology regarding platelet count 42 on CBC today.

## 2023-12-26 NOTE — ED Notes (Signed)
 Pt states unable to provide urine sample at this time. Provided with urinal and instructed to call out using call bell if he is able to provide sample.

## 2023-12-29 ENCOUNTER — Other Ambulatory Visit: Payer: Self-pay | Admitting: Oncology

## 2023-12-29 DIAGNOSIS — D696 Thrombocytopenia, unspecified: Secondary | ICD-10-CM

## 2024-01-11 NOTE — Progress Notes (Unsigned)
 [facility] [phone]   [phone]  INITIAL CONSULT NOTE  Patient Care Team: Celestia Rosaline SQUIBB, NP as PCP - General (Internal Medicine)  Hematological/Oncological History # Thrombocytopenia 10/17/2017: White blood cell 9.3, hemoglobin 15.0, MCV 91.5, platelets 29. 12/26/2023: White blood cell 8.9, hemoglobin 14.1, MCV 89.5, platelets 42. 01/12/2024: establish care with Dr. Federico   CHIEF COMPLAINTS/PURPOSE OF CONSULTATION:  Thrombocytopenia   HISTORY OF PRESENTING ILLNESS:  Anthony Finley 53 y.o. male with medical history significant for asthma, COPD, HLD, and tobacco use who presents for evaluation of thrombocytopenia.  On review of the previous records Anthony Finley had labs collected on 10/17/2017 which showed White blood cell 9.3, hemoglobin 15.0, MCV 91.5, platelets 29.  More recently labs collected on 12/26/2023 showed White blood cell 8.9, hemoglobin 14.1, MCV 89.5, platelets 42.  Due to concern for these findings the patient was referred to hematology for further evaluation and management.  On exam today Anthony Finley reports that he is had thrombocytopenia for at least 6 years.  He reports he has not had any issues with bleeding such as nosebleeds, gum bleeding, or blood in the urine/stool.  He reports he does clot quite quickly if he gets a neck or cut.  He reports that he eats red meat nearly daily and loves it.  He reports that he has never been known to have any issues with his liver and reports that he is having issues with enlarged prostate for which he has been referred to urology.  He notes he is also recently diagnosed with asthma but does not have any other known inflammatory disorders.  Upon further discussion he reports that his mother had pancreatic cancer and he is unsure how his father passed away.  He reports his brother had stomach cancer.  He has 3 children, 1 of whom is deceased secondary to homicide.  He reports that he does  smoke approximately 1 pack/day.  He notes that he used to drink alcohol but quit about 28 years ago.  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  He notes that otherwise he has been at his baseline level of health with the exception of a recent COVID infection.  He denies any current fevers, chills, sweats, nausea, vomiting or diarrhea.  Full 10 point ROS is otherwise negative.  MEDICAL HISTORY:  Past Medical History:  Diagnosis Date   Anxiety    Asthma    mostly as child-uses inh about q 3-4 months   COPD (chronic obstructive pulmonary disease) (HCC)    Hyperlipidemia    Thrombocytopenia (HCC)    Tobacco abuse     SURGICAL HISTORY: Past Surgical History:  Procedure Laterality Date   FRACTURE SURGERY     orif fx lt ankle age 77   HAND SURGERY Left 08/2015   ORIF ANKLE FRACTURE  02/22/2012   Procedure: OPEN REDUCTION INTERNAL FIXATION (ORIF) ANKLE FRACTURE;  Surgeon: Norleen Armor, MD;  Location: [facility];  Service: Orthopedics;  Laterality: Left;  Open Reduction Internal Fixation Left Ankle Bimalleolar Fracture;   Open Reduction Internal Fixation of Syndesmosis, and Repair of Deltoid Ligament   PERCUTANEOUS PINNING Left 08/11/2015   Procedure: PINNING OF LEFT 4TH METACARPAL FRACTURE;  Surgeon: Alm Hummer, MD;  Location: [facility];  Service: Orthopedics;  Laterality: Left;    SOCIAL HISTORY: Social History   Socioeconomic History   Marital status: Single    Spouse name: Not on file   Number of  children: Not on file   Years of education: Not on file   Highest education level: GED or equivalent  Occupational History   Not on file  Tobacco Use   Smoking status: Every Day    Current packs/day: 0.50    Types: Cigarettes   Smokeless tobacco: Never  Vaping Use   Vaping status: Never Used  Substance and Sexual Activity   Alcohol use: Yes    Comment: occ   Drug use: No   Sexual activity: Yes  Other Topics Concern   Not on file  Social  History Narrative   Not on file   Social Drivers of Health   Financial Resource Strain: Low Risk  (12/12/2022)   Overall Financial Resource Strain (CARDIA)    Difficulty of Paying Living Expenses: Not very hard  Food Insecurity: No Food Insecurity (01/12/2024)   Hunger Vital Sign    Worried About Running Out of Food in the Last Year: Never true    Ran Out of Food in the Last Year: Never true  Transportation Needs: No Transportation Needs (01/12/2024)   PRAPARE - Administrator, Civil Service (Medical): No    Lack of Transportation (Non-Medical): No  Physical Activity: Sufficiently Active (12/12/2022)   Exercise Vital Sign    Days of Exercise per Week: 4 days    Minutes of Exercise per Session: 80 min  Stress: No Stress Concern Present (02/12/2023)   Received from [facility] of Occupational Health - Occupational Stress Questionnaire    Feeling of Stress : Only a little  Recent Concern: Stress - Stress Concern Present (12/12/2022)   [employer] of Occupational Health - Occupational Stress Questionnaire    Feeling of Stress : To some extent  Social Connections: Unknown (12/12/2022)   Social Connection and Isolation Panel    Frequency of Communication with Friends and Family: Three times a week    Frequency of Social Gatherings with Friends and Family: Three times a week    Attends Religious Services: More than 4 times per year    Active Member of Clubs or Organizations: No    Attends Banker Meetings: Not on file    Marital Status: Patient declined  Intimate Partner Violence: Not At Risk (01/12/2024)   Humiliation, Afraid, Rape, and Kick questionnaire    Fear of Current or Ex-Partner: No    Emotionally Abused: No    Physically Abused: No    Sexually Abused: No    FAMILY HISTORY: Family History  Problem Relation Age of Onset   Colon polyps Brother    Colon cancer Neg Hx    Esophageal cancer Neg Hx    Rectal cancer Neg Hx     Stomach cancer Neg Hx     ALLERGIES:  is allergic to cephalosporins, fish-derived products, keflex [cephalexin], and penicillins.  MEDICATIONS:  Current Outpatient Medications  Medication Sig Dispense Refill   albuterol  (PROVENTIL ) (2.5 MG/3ML) 0.083% nebulizer solution Take 3 mLs (2.5 mg total) by nebulization every 6 (six) hours as needed for wheezing or shortness of breath. 150 mL 0   fluticasone  (FLOVENT  HFA) 44 MCG/ACT inhaler Inhale 2 puffs into the lungs in the morning and at bedtime. (Patient not taking: Reported on 01/12/2024) 1 each 6   tadalafil (CIALIS) 20 MG tablet Take 20 mg by mouth daily as needed.     VENTOLIN  HFA 108 (90 Base) MCG/ACT inhaler INHALE 2 PUFFS BY MOUTH EVERY 6 HOURS AS NEEDED FOR WHEEZING FOR  SHORTNESS OF BREATH 18 g 2   No current facility-administered medications for this visit.    REVIEW OF SYSTEMS:   Constitutional: ( - ) fevers, ( - )  chills , ( - ) night sweats Eyes: ( - ) blurriness of vision, ( - ) double vision, ( - ) watery eyes Ears, nose, mouth, throat, and face: ( - ) mucositis, ( - ) sore throat Respiratory: ( - ) cough, ( - ) dyspnea, ( - ) wheezes Cardiovascular: ( - ) palpitation, ( - ) chest discomfort, ( - ) lower extremity swelling Gastrointestinal:  ( - ) nausea, ( - ) heartburn, ( - ) change in bowel habits Skin: ( - ) abnormal skin rashes Lymphatics: ( - ) new lymphadenopathy, ( - ) easy bruising Neurological: ( - ) numbness, ( - ) tingling, ( - ) new weaknesses Behavioral/Psych: ( - ) mood change, ( - ) new changes  All other systems were reviewed with the patient and are negative.  PHYSICAL EXAMINATION:  Vitals:   01/12/24 0931  BP: (!) 127/102  Pulse: 73  Resp: 15  Temp: (!) 97.1 F (36.2 C)  SpO2: 99%   Filed Weights   01/12/24 0931  Weight: 238 lb 3.2 oz (108 kg)    GENERAL: well appearing middle-age African-American male in NAD  SKIN: skin color, texture, turgor are normal, no rashes or significant  lesions EYES: conjunctiva are pink and non-injected, sclera clear LUNGS: clear to auscultation and percussion with normal breathing effort HEART: regular rate & rhythm and no murmurs and no lower extremity edema Musculoskeletal: no cyanosis of digits and no clubbing  PSYCH: alert & oriented x 3, fluent speech NEURO: no focal motor/sensory deficits  LABORATORY DATA:  I have reviewed the data as listed    Latest Ref Rng & Units 01/12/2024   10:04 AM 12/26/2023    9:22 AM 03/10/2023    2:03 PM  CBC  WBC 4.0 - 10.5 K/uL 6.7  8.9  8.5   Hemoglobin 13.0 - 17.0 g/dL 85.6  85.8  85.0   Hematocrit 39.0 - 52.0 % 41.8  42.7  43.3   Platelets 150 - 400 K/uL 49  42  60        Latest Ref Rng & Units 01/12/2024   10:04 AM 12/26/2023    9:22 AM 03/10/2023    2:03 PM  CMP  Glucose 70 - 99 mg/dL 772  872  884   BUN 6 - 20 mg/dL 12  10  9    Creatinine 0.61 - 1.24 mg/dL 8.90  8.96  9.15   Sodium 135 - 145 mmol/L 140  133  139   Potassium 3.5 - 5.1 mmol/L 3.9  3.8  3.7   Chloride 98 - 111 mmol/L 107  104  104   CO2 22 - 32 mmol/L 28  23  23    Calcium  8.9 - 10.3 mg/dL 9.1  8.8  9.8   Total Protein 6.5 - 8.1 g/dL 6.6  6.4  7.3   Total Bilirubin 0.0 - 1.2 mg/dL 0.3  0.5  1.1   Alkaline Phos 38 - 126 U/L 53  45  47   AST 15 - 41 U/L 23  23  25    ALT 0 - 44 U/L 30  27  38      ASSESSMENT & PLAN WITT PLITT 53 y.o. male with medical history significant for asthma, COPD, HLD, and tobacco use who presents for evaluation of thrombocytopenia.  After review of the labs, review of the records, and discussion with the patient the patients findings are most consistent with thrombocytopenia of unclear etiology.  Thrombocytopenia is a common condition with a broad differential. The possible etiologies of thrombocytopenia include liver disease, splenomegaly, infectious process, nutritional deficiency, consumption/autoimmune destruction, pseudothrombocytopenia, and bone marrow disorders. Cirrhosis and  liver disease the most common causes of moderate thrombocytopenia. Evaluation should include full hepatitis serologies (Hep B and C) as well as HIV. Imaging of the liver spleen should be performed with an abdominal US  ( if prior imaging is no readily available). Nutritional etiologies should be ruled out with Vitamin b12 and folate testing.  A peripheral blood smear can help determine if there is clumping leading to pseudothrombocytopenia. If no clear etiology can be found would need to consider immune thrombocytopenia (ITP) with consideration of bone marrow biopsy.   #Thrombocytopenia  --will order CBC, CMP, Vitamin b12 and folate.   --will order immature platelet fraction and platelet by citrate --viral serologies with Hepatitis B, Hepatitis C, and HIV  --evaluate for liver disease/splenomegaly with abdominal US . If evidence of liver disease will make referral to gastroenterology.   --RTC pending the results of the above studies.   Orders Placed This Encounter  Procedures   US  Abdomen Complete    Standing Status:   Future    Expected Date:   01/19/2024    Expiration Date:   01/11/2025    Reason for Exam (SYMPTOM  OR DIAGNOSIS REQUIRED):   thrombocytopenia, assess for liver disease/splenomegaly    Preferred imaging location?:   [facility]   CBC with Differential (Cancer Center Only)    Standing Status:   Future    Number of Occurrences:   1    Expiration Date:   01/11/2025   CMP (Cancer Center only)    Standing Status:   Future    Number of Occurrences:   1    Expiration Date:   01/11/2025   Immature Platelet Fraction    Standing Status:   Future    Number of Occurrences:   1    Expiration Date:   01/11/2025   WBC/PLT in Citrate   Vitamin B12    Standing Status:   Future    Number of Occurrences:   1    Expiration Date:   01/11/2025   Methylmalonic acid, serum    Standing Status:   Future    Number of Occurrences:   1    Expiration Date:   01/11/2025   Folate, Serum     Standing Status:   Future    Number of Occurrences:   1    Expiration Date:   01/11/2025   HIV antibody (with reflex)    Standing Status:   Future    Number of Occurrences:   1    Expiration Date:   01/11/2025   Hepatitis C antibody    Standing Status:   Future    Number of Occurrences:   1    Expiration Date:   01/11/2025   Hepatitis B surface antigen    Standing Status:   Future    Number of Occurrences:   1    Expiration Date:   01/11/2025   Hepatitis B surface antibody    Standing Status:   Future    Number of Occurrences:   1    Expiration Date:   01/11/2025   Hepatitis B core antibody, total    Standing Status:   Future  Number of Occurrences:   1    Expiration Date:   01/11/2025    All questions were answered. The patient knows to call the clinic with any problems, questions or concerns.  A total of more than 60 minutes were spent on this encounter with face-to-face time and non-face-to-face time, including preparing to see the patient, ordering tests and/or medications, counseling the patient and coordination of care as outlined above.   Norleen IVAR Kidney, MD Department of Hematology/Oncology [facility] Phone: [phone] Pager: [phone] Email: [email or web address]  01/14/2024 5:49 PM

## 2024-01-12 ENCOUNTER — Inpatient Hospital Stay

## 2024-01-12 ENCOUNTER — Inpatient Hospital Stay: Attending: Hematology and Oncology | Admitting: Hematology and Oncology

## 2024-01-12 VITALS — BP 127/102 | HR 73 | Temp 97.1°F | Resp 15 | Wt 238.2 lb

## 2024-01-12 DIAGNOSIS — D696 Thrombocytopenia, unspecified: Secondary | ICD-10-CM

## 2024-01-12 DIAGNOSIS — J4489 Other specified chronic obstructive pulmonary disease: Secondary | ICD-10-CM | POA: Diagnosis not present

## 2024-01-12 DIAGNOSIS — F17210 Nicotine dependence, cigarettes, uncomplicated: Secondary | ICD-10-CM | POA: Diagnosis not present

## 2024-01-12 DIAGNOSIS — Z800 Family history of malignant neoplasm of digestive organs: Secondary | ICD-10-CM | POA: Diagnosis not present

## 2024-01-12 LAB — CBC WITH DIFFERENTIAL (CANCER CENTER ONLY)
Abs Immature Granulocytes: 0.03 K/uL (ref 0.00–0.07)
Basophils Absolute: 0 K/uL (ref 0.0–0.1)
Basophils Relative: 1 %
Eosinophils Absolute: 0.2 K/uL (ref 0.0–0.5)
Eosinophils Relative: 4 %
HCT: 41.8 % (ref 39.0–52.0)
Hemoglobin: 14.3 g/dL (ref 13.0–17.0)
Immature Granulocytes: 1 %
Lymphocytes Relative: 19 %
Lymphs Abs: 1.3 K/uL (ref 0.7–4.0)
MCH: 30.1 pg (ref 26.0–34.0)
MCHC: 34.2 g/dL (ref 30.0–36.0)
MCV: 88 fL (ref 80.0–100.0)
Monocytes Absolute: 0.6 K/uL (ref 0.1–1.0)
Monocytes Relative: 9 %
Neutro Abs: 4.5 K/uL (ref 1.7–7.7)
Neutrophils Relative %: 66 %
Platelet Count: 49 K/uL — ABNORMAL LOW (ref 150–400)
RBC: 4.75 MIL/uL (ref 4.22–5.81)
RDW: 12.9 % (ref 11.5–15.5)
WBC Count: 6.7 K/uL (ref 4.0–10.5)
nRBC: 0 % (ref 0.0–0.2)

## 2024-01-12 LAB — CMP (CANCER CENTER ONLY)
ALT: 30 U/L (ref 0–44)
AST: 23 U/L (ref 15–41)
Albumin: 4 g/dL (ref 3.5–5.0)
Alkaline Phosphatase: 53 U/L (ref 38–126)
Anion gap: 5 (ref 5–15)
BUN: 12 mg/dL (ref 6–20)
CO2: 28 mmol/L (ref 22–32)
Calcium: 9.1 mg/dL (ref 8.9–10.3)
Chloride: 107 mmol/L (ref 98–111)
Creatinine: 1.09 mg/dL (ref 0.61–1.24)
GFR, Estimated: >60 mL/min (ref >60)
Glucose, Bld: 227 mg/dL — ABNORMAL HIGH (ref 70–99)
Potassium: 3.9 mmol/L (ref 3.5–5.1)
Sodium: 140 mmol/L (ref 135–145)
Total Bilirubin: 0.3 mg/dL (ref 0.0–1.2)
Total Protein: 6.6 g/dL (ref 6.5–8.1)

## 2024-01-12 LAB — FOLATE: Folate: 7 ng/mL (ref >5.9)

## 2024-01-12 LAB — HEPATITIS B SURFACE ANTIBODY,QUALITATIVE: Hep B S Ab: NONREACTIVE

## 2024-01-12 LAB — HIV ANTIBODY (ROUTINE TESTING W REFLEX): HIV Screen 4th Generation wRfx: NONREACTIVE

## 2024-01-12 LAB — HEPATITIS C ANTIBODY: HCV Ab: NONREACTIVE

## 2024-01-12 LAB — WBC/PLT IN CITRATE

## 2024-01-12 LAB — VITAMIN B12: Vitamin B-12: 380 pg/mL (ref 180–914)

## 2024-01-12 LAB — IMMATURE PLATELET FRACTION: Immature Platelet Fraction: 16.3 % — ABNORMAL HIGH (ref 1.2–8.6)

## 2024-01-12 LAB — HEPATITIS B SURFACE ANTIGEN: Hepatitis B Surface Ag: NONREACTIVE

## 2024-01-13 LAB — HEPATITIS B CORE ANTIBODY, TOTAL: HEP B CORE AB: NEGATIVE

## 2024-01-17 LAB — METHYLMALONIC ACID, SERUM: Methylmalonic Acid, Quantitative: 210 nmol/L (ref 0–378)

## 2024-01-18 ENCOUNTER — Ambulatory Visit: Payer: Self-pay | Admitting: *Deleted

## 2024-01-18 NOTE — Telephone Encounter (Addendum)
 PC to patient to provide message below from Dr. Federico.  Left general message on un named VM for patient to contact office for results and further information with office contact info.   ----- Message ----- From: Federico Norleen ONEIDA MADISON, MD Sent: 01/14/2024   5:50 PM EDT  Please let Mr. Aldana know that his blood work did not show any concerning abnormalities to explain his low platelets.  An ultrasound of the liver has been ordered, please assure that he has this  scheduled with our radiology department.  We will circle back once the ultrasound has been completed.

## 2024-01-23 ENCOUNTER — Other Ambulatory Visit (INDEPENDENT_AMBULATORY_CARE_PROVIDER_SITE_OTHER): Payer: Self-pay | Admitting: Primary Care

## 2024-01-23 DIAGNOSIS — J441 Chronic obstructive pulmonary disease with (acute) exacerbation: Secondary | ICD-10-CM

## 2024-01-25 NOTE — Telephone Encounter (Signed)
 Anthony Finley returned call and information from Dr. Federico provided to him. He asked that phone number for Centralized Radiology Scheduling be sent to him via text and he will make appt for US .  Advised him it would be sent via MyChart. He verbalized understanding.

## 2024-01-25 NOTE — Telephone Encounter (Signed)
-----   Message from Nurse Almarie T sent at 01/16/2024  5:47 PM EDT -----  ----- Message ----- From: Federico Norleen ONEIDA MADISON, MD Sent: 01/14/2024   5:50 PM EDT To: Almarie DELENA Arabia, RN  Please let Mr. Minus know that his blood work did not show any concerning abnormalities to explain his low platelets.  An ultrasound of the liver has been ordered, please assure that he has this  scheduled with our radiology department.  We will circle back once the ultrasound has been completed. ----- Message ----- From: Rebecka, Lab In [place] Sent: 01/12/2024  10:58 AM EDT To: Norleen ONEIDA Federico MADISON, MD

## 2024-02-08 ENCOUNTER — Ambulatory Visit (HOSPITAL_COMMUNITY)
Admission: RE | Admit: 2024-02-08 | Discharge: 2024-02-08 | Disposition: A | Discharge status: Home / Self Care | Source: Ambulatory Visit | Attending: Hematology and Oncology | Admitting: Hematology and Oncology

## 2024-02-08 DIAGNOSIS — D696 Thrombocytopenia, unspecified: Secondary | ICD-10-CM | POA: Diagnosis present

## 2024-02-19 ENCOUNTER — Other Ambulatory Visit (INDEPENDENT_AMBULATORY_CARE_PROVIDER_SITE_OTHER): Payer: Self-pay | Admitting: Primary Care

## 2024-02-19 DIAGNOSIS — J441 Chronic obstructive pulmonary disease with (acute) exacerbation: Secondary | ICD-10-CM

## 2024-02-21 ENCOUNTER — Other Ambulatory Visit: Payer: Self-pay | Admitting: Hematology and Oncology

## 2024-02-21 DIAGNOSIS — D696 Thrombocytopenia, unspecified: Secondary | ICD-10-CM

## 2024-02-22 ENCOUNTER — Telehealth: Payer: Self-pay | Admitting: *Deleted

## 2024-02-22 NOTE — Telephone Encounter (Signed)
 PC to patient per MD message below.  LVM on non-named VM: Calling from Dr. Lafonda office with results from recent test and recommendations. Please contact the office for further details. A message will be sent via MyChart.    ----- Message from Norleen ONEIDA Kidney IV sent at 02/21/2024  4:21 PM EDT ----- Please let Anthony Finley know that his ultrasound showed concern for underlying liver disease.  I have placed a request for [place] [facility] to reach out to him: He was previously seen by Rolan Cedar who is no longer practicing).  Please have him give us  a call if he does not hear from the GI team within a week.

## 2024-02-22 NOTE — Telephone Encounter (Signed)
-----   Message from Norleen ONEIDA Kidney IV sent at 02/21/2024  4:21 PM EDT ----- Please let Anthony Finley know that his ultrasound showed concern for underlying liver disease.  I have placed a request for [place] gastroenterology to reach out to him: He was previously seen by Rolan Cedar who is no longer practicing).  Please have him give us  a call if he does not hear from the GI team within a week. ----- Message ----- From: Interface, Rad Results In Sent: 02/12/2024   8:04 AM EDT To: Norleen ONEIDA Kidney MADISON, MD

## 2024-02-27 ENCOUNTER — Telehealth: Payer: Self-pay | Admitting: *Deleted

## 2024-02-27 NOTE — Telephone Encounter (Signed)
 Received returned call from pt, left a vm message requesting a call back. TCT patient and spoke with him. Advised that his labs were ok except for his platelets being low and his US  may be showing liver disease. Advised that referral was made to [facility]. Provided phone # so he could call to see if he could make an appt.  No other questions or concerns.

## 2024-03-24 ENCOUNTER — Other Ambulatory Visit (INDEPENDENT_AMBULATORY_CARE_PROVIDER_SITE_OTHER): Payer: Self-pay | Admitting: Primary Care

## 2024-03-24 DIAGNOSIS — J441 Chronic obstructive pulmonary disease with (acute) exacerbation: Secondary | ICD-10-CM

## 2024-03-26 NOTE — Telephone Encounter (Signed)
 Requested medications are due for refill today.  yes  Requested medications are on the active medications list.  yes  Last refill. 01/23/2024 18 0 rf  Future visit scheduled.   no  Notes to clinic.  No pcp listed.    Requested Prescriptions  Pending Prescriptions Disp Refills   VENTOLIN  HFA 108 (90 Base) MCG/ACT inhaler [Pharmacy Med Name: Ventolin  HFA 108 (90 Base) MCG/ACT Inhalation Aerosol Solution] 18 g 0    Sig: INHALE 2 PUFFS BY MOUTH EVERY 6 HOURS AS NEEDED FOR WHEEZING OR SHORTNESS OF BREATH     Pulmonology:  Beta Agonists 2 Failed - 03/26/2024  3:39 PM      Failed - Last BP in normal range    BP Readings from Last 1 Encounters:  01/12/24 (!) 127/102         Passed - Last Heart Rate in normal range    Pulse Readings from Last 1 Encounters:  01/12/24 73         Passed - Valid encounter within last 12 months    Recent Outpatient Visits           11 months ago Asthma with COPD with exacerbation (HCC)   [place] [facility] Celestia Rosaline SQUIBB, NP   1 year ago Asthma with COPD with exacerbation [facility])   [place] [facility] Celestia Rosaline SQUIBB, NP   1 year ago Need for shingles vaccine   [place] [facility] Celestia Rosaline SQUIBB, NP   3 years ago Left leg pain   [facility] RAMAN, NP   3 years ago Encounter to establish care   [place] [facility] Celestia Rosaline SQUIBB, NP
